# Patient Record
Sex: Female | Born: 1995 | Race: Black or African American | Hispanic: No | Marital: Single | Smoking: Never smoker
Health system: Southern US, Community
[De-identification: ages and names within clinical notes are randomized; demographics above are authoritative.]

## PROBLEM LIST (undated history)

## (undated) ENCOUNTER — Inpatient Hospital Stay (HOSPITAL_COMMUNITY): Payer: Self-pay

## (undated) ENCOUNTER — Emergency Department (HOSPITAL_COMMUNITY): Payer: BC Managed Care – PPO

## (undated) DIAGNOSIS — D270 Benign neoplasm of right ovary: Secondary | ICD-10-CM

## (undated) DIAGNOSIS — J45909 Unspecified asthma, uncomplicated: Secondary | ICD-10-CM

## (undated) DIAGNOSIS — D369 Benign neoplasm, unspecified site: Secondary | ICD-10-CM

## (undated) DIAGNOSIS — R519 Headache, unspecified: Secondary | ICD-10-CM

## (undated) DIAGNOSIS — K219 Gastro-esophageal reflux disease without esophagitis: Secondary | ICD-10-CM

## (undated) DIAGNOSIS — O09299 Supervision of pregnancy with other poor reproductive or obstetric history, unspecified trimester: Secondary | ICD-10-CM

## (undated) DIAGNOSIS — B977 Papillomavirus as the cause of diseases classified elsewhere: Secondary | ICD-10-CM

## (undated) HISTORY — DX: Benign neoplasm of right ovary: D27.0

## (undated) HISTORY — DX: Supervision of pregnancy with other poor reproductive or obstetric history, unspecified trimester: O09.299

## (undated) HISTORY — DX: Unspecified asthma, uncomplicated: J45.909

## (undated) HISTORY — PX: APPENDECTOMY: SHX54

---

## 2015-05-22 ENCOUNTER — Inpatient Hospital Stay (HOSPITAL_COMMUNITY)
Admission: AD | Admit: 2015-05-22 | Discharge: 2015-05-22 | Disposition: A | Discharge status: Home / Self Care | Payer: Self-pay | Source: Ambulatory Visit | Attending: Obstetrics and Gynecology | Admitting: Obstetrics and Gynecology

## 2015-05-22 ENCOUNTER — Inpatient Hospital Stay (HOSPITAL_COMMUNITY): Payer: Self-pay

## 2015-05-22 ENCOUNTER — Encounter (HOSPITAL_COMMUNITY): Payer: Self-pay | Admitting: *Deleted

## 2015-05-22 DIAGNOSIS — O26899 Other specified pregnancy related conditions, unspecified trimester: Secondary | ICD-10-CM

## 2015-05-22 DIAGNOSIS — R109 Unspecified abdominal pain: Secondary | ICD-10-CM | POA: Insufficient documentation

## 2015-05-22 DIAGNOSIS — O9989 Other specified diseases and conditions complicating pregnancy, childbirth and the puerperium: Secondary | ICD-10-CM | POA: Insufficient documentation

## 2015-05-22 DIAGNOSIS — Z3A01 Less than 8 weeks gestation of pregnancy: Secondary | ICD-10-CM | POA: Insufficient documentation

## 2015-05-22 LAB — POCT PREGNANCY, URINE: PREG TEST UR: POSITIVE — AB

## 2015-05-22 LAB — HCG, QUANTITATIVE, PREGNANCY: hCG, Beta Chain, Quant, S: 2904 m[IU]/mL — ABNORMAL HIGH (ref <5)

## 2015-05-22 LAB — CBC
HCT: 34.5 % — ABNORMAL LOW (ref 36.0–46.0)
Hemoglobin: 11.5 g/dL — ABNORMAL LOW (ref 12.0–15.0)
MCH: 26.8 pg (ref 26.0–34.0)
MCHC: 33.3 g/dL (ref 30.0–36.0)
MCV: 80.4 fL (ref 78.0–100.0)
PLATELETS: 344 10*3/uL (ref 150–400)
RBC: 4.29 MIL/uL (ref 3.87–5.11)
RDW: 15.7 % — AB (ref 11.5–15.5)
WBC: 11.3 10*3/uL — ABNORMAL HIGH (ref 4.0–10.5)

## 2015-05-22 LAB — WET PREP, GENITAL
TRICH WET PREP: NONE SEEN
Yeast Wet Prep HPF POC: NONE SEEN

## 2015-05-22 LAB — URINALYSIS, ROUTINE W REFLEX MICROSCOPIC
BILIRUBIN URINE: NEGATIVE
Glucose, UA: NEGATIVE mg/dL
Hgb urine dipstick: NEGATIVE
KETONES UR: NEGATIVE mg/dL
Nitrite: NEGATIVE
Protein, ur: NEGATIVE mg/dL
Specific Gravity, Urine: 1.025 (ref 1.005–1.030)
UROBILINOGEN UA: 0.2 mg/dL (ref 0.0–1.0)
pH: 5.5 (ref 5.0–8.0)

## 2015-05-22 LAB — URINE MICROSCOPIC-ADD ON

## 2015-05-22 NOTE — MAU Note (Addendum)
Pain in lower abd for past 3-4 days.  Every time she eats something she has burning and she can't sleep at night. +HPT x3

## 2015-05-22 NOTE — MAU Provider Note (Signed)
History     CSN: [id number]  Arrival date and time: 05/22/15 1231   None     Chief Complaint  Patient presents with  . Abdominal Pain   HPIpt is 19 yo G2P1 at [redacted]w[redacted]d pregnant who presents with lower abdominal pain. Pt denies constipation, diarrhea, spotting, bleeding or UTI sx.  Pt has had intermittent lower midline abd pain. Pt denies nausea-pt has not taken anything for the pain.  RN note: Joie Bimler Spurlock-Frizzell, RN Registered Nurse Addendum  MAU Note 05/22/2015 12:44 PM    Expand All Collapse All   Pain in lower abd for past 3-4 days. Every time she eats something she has burning and she can't sleep at night. +HPT x3       No past medical history on file.  No past surgical history on file.  No family history on file.  History  Substance Use Topics  . Smoking status: Not on file  . Smokeless tobacco: Not on file  . Alcohol Use: Not on file    Allergies: Allergies not on file  No prescriptions prior to admission    Review of Systems  Constitutional: Negative for fever and chills.  Gastrointestinal: Positive for abdominal pain. Negative for nausea, vomiting, diarrhea and constipation.  Genitourinary: Negative for dysuria and urgency.  Neurological: Negative for headaches.   Physical Exam   Blood pressure 137/68, pulse 75, temperature 98.3 F (36.8 C), temperature source Oral, resp. rate 18, height 5' 3.5" (1.613 m), weight 194 lb (87.998 kg), last menstrual period 04/08/2015.  Physical Exam  Nursing note and vitals reviewed. Constitutional: She is oriented to person, place, and time. She appears well-developed and well-nourished. No distress.  HENT:  Head: Normocephalic.  Eyes: Pupils are equal, round, and reactive to light.  Neck: Normal range of motion. Neck supple.  Cardiovascular: Normal rate.   Respiratory: Effort normal.  GI: Soft. She exhibits no distension. There is no tenderness. There is no rebound and no guarding.  Genitourinary: Vagina  normal.  Vagina clean, cervix clean, closed NT; uterus and adnexa without palpable enlargement or tenderness- exam complicated by habitus  Musculoskeletal: Normal range of motion.  Neurological: She is alert and oriented to person, place, and time.  Skin: Skin is warm and dry.  Psychiatric: She has a normal mood and affect.    MAU Course  Procedures Results for orders placed or performed during the hospital encounter of 05/22/15 (from the past 24 hour(s))  Urinalysis, Routine w reflex microscopic (not at [facility])     Status: Abnormal   Collection Time: 05/22/15 12:45 PM  Result Value Ref Range   Color, Urine YELLOW YELLOW   APPearance CLOUDY (A) CLEAR   Specific Gravity, Urine 1.025 1.005 - 1.030   pH 5.5 5.0 - 8.0   Glucose, UA NEGATIVE NEGATIVE mg/dL   Hgb urine dipstick NEGATIVE NEGATIVE   Bilirubin Urine NEGATIVE NEGATIVE   Ketones, ur NEGATIVE NEGATIVE mg/dL   Protein, ur NEGATIVE NEGATIVE mg/dL   Urobilinogen, UA 0.2 0.0 - 1.0 mg/dL   Nitrite NEGATIVE NEGATIVE   Leukocytes, UA MODERATE (A) NEGATIVE  Urine microscopic-add on     Status: Abnormal   Collection Time: 05/22/15 12:45 PM  Result Value Ref Range   Squamous Epithelial / LPF MANY (A) RARE   WBC, UA 7-10 <3 WBC/hpf   Bacteria, UA FEW (A) RARE  Pregnancy, urine POC     Status: Abnormal   Collection Time: 05/22/15 12:56 PM  Result Value Ref Range  Preg Test, Ur POSITIVE (A) NEGATIVE  Wet prep, genital     Status: Abnormal   Collection Time: 05/22/15  3:35 PM  Result Value Ref Range   Yeast Wet Prep HPF POC NONE SEEN NONE SEEN   Trich, Wet Prep NONE SEEN NONE SEEN   Clue Cells Wet Prep HPF POC FEW (A) NONE SEEN   WBC, Wet Prep HPF POC FEW (A) NONE SEEN  CBC     Status: Abnormal   Collection Time: 05/22/15  3:39 PM  Result Value Ref Range   WBC 11.3 (H) 4.0 - 10.5 K/uL   RBC 4.29 3.87 - 5.11 MIL/uL   Hemoglobin 11.5 (L) 12.0 - 15.0 g/dL   HCT 34.5 (L) 36.0 - 46.0 %   MCV 80.4 78.0 - 100.0 fL   MCH 26.8 26.0 -  34.0 pg   MCHC 33.3 30.0 - 36.0 g/dL   RDW 15.7 (H) 11.5 - 15.5 %   Platelets 344 150 - 400 K/uL  hCG, quantitative, pregnancy     Status: Abnormal   Collection Time: 05/22/15  3:39 PM  Result Value Ref Range   hCG, Beta Chain, Quant, S 2904 (H) <5 mIU/mL  ABO/Rh     Status: None (Preliminary result)   Collection Time: 05/22/15  3:39 PM  Result Value Ref Range   ABO/RH(D) O POS    Results for orders placed or performed during the hospital encounter of 05/22/15 (from the past 24 hour(s))  Urinalysis, Routine w reflex microscopic (not at [facility])     Status: Abnormal   Collection Time: 05/22/15 12:45 PM  Result Value Ref Range   Color, Urine YELLOW YELLOW   APPearance CLOUDY (A) CLEAR   Specific Gravity, Urine 1.025 1.005 - 1.030   pH 5.5 5.0 - 8.0   Glucose, UA NEGATIVE NEGATIVE mg/dL   Hgb urine dipstick NEGATIVE NEGATIVE   Bilirubin Urine NEGATIVE NEGATIVE   Ketones, ur NEGATIVE NEGATIVE mg/dL   Protein, ur NEGATIVE NEGATIVE mg/dL   Urobilinogen, UA 0.2 0.0 - 1.0 mg/dL   Nitrite NEGATIVE NEGATIVE   Leukocytes, UA MODERATE (A) NEGATIVE  Urine microscopic-add on     Status: Abnormal   Collection Time: 05/22/15 12:45 PM  Result Value Ref Range   Squamous Epithelial / LPF MANY (A) RARE   WBC, UA 7-10 <3 WBC/hpf   Bacteria, UA FEW (A) RARE  Pregnancy, urine POC     Status: Abnormal   Collection Time: 05/22/15 12:56 PM  Result Value Ref Range   Preg Test, Ur POSITIVE (A) NEGATIVE  Wet prep, genital     Status: Abnormal   Collection Time: 05/22/15  3:35 PM  Result Value Ref Range   Yeast Wet Prep HPF POC NONE SEEN NONE SEEN   Trich, Wet Prep NONE SEEN NONE SEEN   Clue Cells Wet Prep HPF POC FEW (A) NONE SEEN   WBC, Wet Prep HPF POC FEW (A) NONE SEEN  CBC     Status: Abnormal   Collection Time: 05/22/15  3:39 PM  Result Value Ref Range   WBC 11.3 (H) 4.0 - 10.5 K/uL   RBC 4.29 3.87 - 5.11 MIL/uL   Hemoglobin 11.5 (L) 12.0 - 15.0 g/dL   HCT 34.5 (L) 36.0 - 46.0 %   MCV 80.4  78.0 - 100.0 fL   MCH 26.8 26.0 - 34.0 pg   MCHC 33.3 30.0 - 36.0 g/dL   RDW 15.7 (H) 11.5 - 15.5 %   Platelets 344 150 - 400  K/uL  hCG, quantitative, pregnancy     Status: Abnormal   Collection Time: 05/22/15  3:39 PM  Result Value Ref Range   hCG, Beta Chain, Quant, S 2904 (H) <5 mIU/mL  ABO/Rh     Status: None (Preliminary result)   Collection Time: 05/22/15  3:39 PM  Result Value Ref Range   ABO/RH(D) O POS    US Ob Comp Less 14 Wks  05/22/2015   CLINICAL DATA:  Low abdominal pain with positive pregnancy test. Initial encounter.  EXAM: OBSTETRIC <14 WK [place] AND TRANSVAGINAL OB US  TECHNIQUE: Both transabdominal and transvaginal ultrasound examinations were performed for complete evaluation of the gestation as well as the maternal uterus, adnexal regions, and pelvic cul-de-sac. Transvaginal technique was performed to assess early pregnancy.  COMPARISON:  None.  FINDINGS: Intrauterine gestational sac: Visualized.  Yolk sac:  Visualized  Embryo:  Not visualized  MSD: 4.6  mm   5 w   0  d  [place] EDC: 01/22/2016  Maternal uterus/adnexae: Small subchorionic hemorrhage is associated. Maternal left ovary is sonographically normal. Corpus luteum cyst identified in the right ovary with an indeterminate 16 mm hyperechoic focus within the parenchyma. No intraperitoneal free fluid.  IMPRESSION: Intrauterine gestational sac visualized with yolk sac barely perceptible but no embryo yet evident. Given the very early stage of gestation by ultrasound, consider follow-up ultrasound exam in 7-10 days to ensure appropriate pregnancy progression. Right ovary could be reassessed at that time as well.   Electronically Signed   By: Misty Stanley M.D.   On: 05/22/2015 16:54   US Ob Transvaginal  05/22/2015   CLINICAL DATA:  Low abdominal pain with positive pregnancy test. Initial encounter.  EXAM: OBSTETRIC <14 WK [place] AND TRANSVAGINAL OB US  TECHNIQUE: Both transabdominal and transvaginal ultrasound examinations were  performed for complete evaluation of the gestation as well as the maternal uterus, adnexal regions, and pelvic cul-de-sac. Transvaginal technique was performed to assess early pregnancy.  COMPARISON:  None.  FINDINGS: Intrauterine gestational sac: Visualized.  Yolk sac:  Visualized  Embryo:  Not visualized  MSD: 4.6  mm   5 w   0  d  [place] EDC: 01/22/2016  Maternal uterus/adnexae: Small subchorionic hemorrhage is associated. Maternal left ovary is sonographically normal. Corpus luteum cyst identified in the right ovary with an indeterminate 16 mm hyperechoic focus within the parenchyma. No intraperitoneal free fluid.  IMPRESSION: Intrauterine gestational sac visualized with yolk sac barely perceptible but no embryo yet evident. Given the very early stage of gestation by ultrasound, consider follow-up ultrasound exam in 7-10 days to ensure appropriate pregnancy progression. Right ovary could be reassessed at that time as well.   Electronically Signed   By: Misty Stanley M.D.   On: 05/22/2015 16:54  will repeat HCG in 48 hours since YS barely pereptible Assessment and Plan  abd pain in pregnancy- repeat HG in 48 hours- order submitted Pt to return sooner if increase in pain or bleeding  Edrie Ehrich 05/22/2015, 2:56 PM

## 2015-05-22 NOTE — Discharge Instructions (Signed)

## 2015-05-23 LAB — ABO/RH: ABO/RH(D): O POS

## 2015-05-23 LAB — RPR: RPR Ser Ql: NONREACTIVE

## 2015-05-23 LAB — GC/CHLAMYDIA PROBE AMP (~~LOC~~) NOT AT ARMC
Chlamydia: NEGATIVE
NEISSERIA GONORRHEA: NEGATIVE

## 2015-05-23 LAB — HIV ANTIBODY (ROUTINE TESTING W REFLEX): HIV Screen 4th Generation wRfx: NONREACTIVE

## 2015-05-24 ENCOUNTER — Inpatient Hospital Stay (HOSPITAL_COMMUNITY)
Admission: AD | Admit: 2015-05-24 | Discharge: 2015-05-24 | Disposition: A | Discharge status: Home / Self Care | Payer: Self-pay | Source: Ambulatory Visit | Attending: Obstetrics & Gynecology | Admitting: Obstetrics & Gynecology

## 2015-05-24 DIAGNOSIS — Z3A01 Less than 8 weeks gestation of pregnancy: Secondary | ICD-10-CM | POA: Insufficient documentation

## 2015-05-24 DIAGNOSIS — R109 Unspecified abdominal pain: Secondary | ICD-10-CM

## 2015-05-24 DIAGNOSIS — O26899 Other specified pregnancy related conditions, unspecified trimester: Secondary | ICD-10-CM

## 2015-05-24 DIAGNOSIS — O9989 Other specified diseases and conditions complicating pregnancy, childbirth and the puerperium: Secondary | ICD-10-CM | POA: Insufficient documentation

## 2015-05-24 LAB — HCG, QUANTITATIVE, PREGNANCY: hCG, Beta Chain, Quant, S: 4218 m[IU]/mL — ABNORMAL HIGH (ref <5)

## 2015-05-24 NOTE — Discharge Instructions (Signed)
First Trimester of Pregnancy The first trimester of pregnancy is from week 1 until the end of week 12 (months 1 through 3). During this time, your baby will begin to develop inside you. At 6-8 weeks, the eyes and face are formed, and the heartbeat can be seen on ultrasound. At the end of 12 weeks, all the baby's organs are formed. Prenatal care is all the medical care you receive before the birth of your baby. Make sure you get good prenatal care and follow all of your doctor's instructions. HOME CARE  Medicines  Take medicine only as told by your doctor. Some medicines are safe and some are not during pregnancy.  Take your prenatal vitamins as told by your doctor.  Take medicine that helps you poop (stool softener) as needed if your doctor says it is okay. Diet  Eat regular, healthy meals.  Your doctor will tell you the amount of weight gain that is right for you.  Avoid raw meat and uncooked cheese.  If you feel sick to your stomach (nauseous) or throw up (vomit):  Eat 4 or 5 small meals a day instead of 3 large meals.  Try eating a few soda crackers.  Drink liquids between meals instead of during meals.  If you have a hard time pooping (constipation):  Eat high-fiber foods like fresh vegetables, fruit, and whole grains.  Drink enough fluids to keep your pee (urine) clear or pale yellow. Activity and Exercise  Exercise only as told by your doctor. Stop exercising if you have cramps or pain in your lower belly (abdomen) or low back.  Try to avoid standing for long periods of time. Move your legs often if you must stand in one place for a long time.  Avoid heavy lifting.  Wear low-heeled shoes. Sit and stand up straight.  You can have sex unless your doctor tells you not to. Relief of Pain or Discomfort  Wear a good support bra if your breasts are sore.  Take warm water baths (sitz baths) to soothe pain or discomfort caused by hemorrhoids. Use hemorrhoid cream if your  doctor says it is okay.  Rest with your legs raised if you have leg cramps or low back pain.  Wear support hose if you have puffy, bulging veins (varicose veins) in your legs. Raise (elevate) your feet for 15 minutes, 3-4 times a day. Limit salt in your diet. Prenatal Care  Schedule your prenatal visits by the twelfth week of pregnancy.  Write down your questions. Take them to your prenatal visits.  Keep all your prenatal visits as told by your doctor. Safety  Wear your seat belt at all times when driving.  Make a list of emergency phone numbers. The list should include numbers for family, friends, the hospital, and police and fire departments. General Tips  Ask your doctor for a referral to a local prenatal class. Begin classes no later than at the start of month 6 of your pregnancy.  Ask for help if you need counseling or help with nutrition. Your doctor can give you advice or tell you where to go for help.  Do not use hot tubs, steam rooms, or saunas.  Do not douche or use tampons or scented sanitary pads.  Do not cross your legs for long periods of time.  Avoid litter boxes and soil used by cats.  Avoid all smoking, herbs, and alcohol. Avoid drugs not approved by your doctor.  Visit your dentist. At home, brush your teeth   with a soft toothbrush. Be gentle when you floss. GET HELP IF:  You are dizzy.  You have mild cramps or pressure in your lower belly.  You have a nagging pain in your belly area.  You continue to feel sick to your stomach, throw up, or have watery poop (diarrhea).  You have a bad smelling fluid coming from your vagina.  You have pain with peeing (urination).  You have increased puffiness (swelling) in your face, hands, legs, or ankles. GET HELP RIGHT AWAY IF:   You have a fever.  You are leaking fluid from your vagina.  You have spotting or bleeding from your vagina.  You have very bad belly cramping or pain.  You gain or lose weight  rapidly.  You throw up blood. It may look like coffee grounds.  You are around people who have German measles, fifth disease, or chickenpox.  You have a very bad headache.  You have shortness of breath.  You have any kind of trauma, such as from a fall or a car accident. Document Released: 05/11/2008 Document Revised: 04/09/2014 Document Reviewed: 10/03/2013 ExitCare Patient Information 2015 [employer]. This information is not intended to replace advice given to you by your health care provider. Make sure you discuss any questions you have with your health care provider.  

## 2015-05-24 NOTE — MAU Note (Signed)
Here for repeat blood work.  Feeling fine.

## 2015-05-24 NOTE — MAU Provider Note (Signed)
Jillian Bishop  is a 19 y.o. G2P1001 at [redacted]w[redacted]d who presents to MAU today for follow-up quant hCG after 48 hours. After reviewing patient's [place] and lab results from previous visit repeat hCG testing was not required. IUGS and YS noted on [place] on 05/22/15. Patient denies vaginal bleeding, abdominal pain or fever today.   BP 132/65 mmHg  Pulse 74  Temp(Src) 98.3 F (36.8 C) (Oral)  Resp 18  Ht 5\' 4"  (1.626 m)  Wt 194 lb (87.998 kg)  BMI 33.28 kg/m2  LMP 04/08/2015  CONSTITUTIONAL: Well-developed, well-nourished female in no acute distress.  ENT: External right and left ear normal.  EYES: EOM intact, conjunctivae normal.  MUSCULOSKELETAL: Normal range of motion.  CARDIOVASCULAR: Regular heart rate RESPIRATORY: Normal effort NEUROLOGICAL: Alert and oriented to person, place, and time.  SKIN: Skin is warm and dry. No rash noted. Not diaphoretic. No erythema. No pallor. PSYCH: Normal mood and affect. Normal behavior. Normal judgment and thought content.  Results for orders placed or performed during the hospital encounter of 05/24/15 (from the past 24 hour(s))  hCG, quantitative, pregnancy     Status: Abnormal   Collection Time: 05/24/15  9:33 AM  Result Value Ref Range   hCG, Beta Chain, Quant, S 4218 (H) <5 mIU/mL    MDM [place] dating is not consistent with LMP dating and repeat hCG did not rise appropriately, however we have already ruled-out ectopic pregnancy Will repeat US in 1 week to determine viability  A: IUGS and YS at [redacted]w[redacted]d  P: Discharge home First trimester/ectopic precautions discussed Patient will return for follow-up US in 1 week. Order placed. They will call the patient with an appointment time Patient may return to MAU as needed or if her condition were to change or worsen   Luvenia Redden, PA-C 05/24/2015 11:26 AM

## 2015-05-30 LAB — OB RESULTS CONSOLE HGB/HCT, BLOOD
HEMATOCRIT: 37 %
Hemoglobin: 11.6 g/dL

## 2015-05-30 LAB — OB RESULTS CONSOLE HIV ANTIBODY (ROUTINE TESTING): HIV: NONREACTIVE

## 2015-05-30 LAB — OB RESULTS CONSOLE RPR: RPR: NONREACTIVE

## 2015-05-30 LAB — OB RESULTS CONSOLE HEPATITIS B SURFACE ANTIGEN: HEP B S AG: NEGATIVE

## 2015-05-30 LAB — OB RESULTS CONSOLE GC/CHLAMYDIA
Chlamydia: NEGATIVE
Gonorrhea: NEGATIVE

## 2015-05-30 LAB — OB RESULTS CONSOLE RUBELLA ANTIBODY, IGM: Rubella: IMMUNE

## 2015-05-30 LAB — OB RESULTS CONSOLE PLATELET COUNT: Platelets: 331 10*3/uL

## 2015-06-03 ENCOUNTER — Ambulatory Visit (HOSPITAL_COMMUNITY)
Admission: RE | Admit: 2015-06-03 | Discharge: 2015-06-03 | Disposition: A | Discharge status: Home / Self Care | Payer: Self-pay | Source: Ambulatory Visit | Attending: Medical | Admitting: Medical

## 2015-06-03 ENCOUNTER — Inpatient Hospital Stay (HOSPITAL_COMMUNITY)
Admission: AD | Admit: 2015-06-03 | Discharge: 2015-06-03 | Disposition: A | Discharge status: Home / Self Care | Payer: Self-pay | Source: Ambulatory Visit | Attending: Obstetrics & Gynecology | Admitting: Obstetrics & Gynecology

## 2015-06-03 DIAGNOSIS — O26899 Other specified pregnancy related conditions, unspecified trimester: Secondary | ICD-10-CM

## 2015-06-03 DIAGNOSIS — Z3491 Encounter for supervision of normal pregnancy, unspecified, first trimester: Secondary | ICD-10-CM

## 2015-06-03 DIAGNOSIS — Z3A01 Less than 8 weeks gestation of pregnancy: Secondary | ICD-10-CM | POA: Insufficient documentation

## 2015-06-03 DIAGNOSIS — O3481 Maternal care for other abnormalities of pelvic organs, first trimester: Secondary | ICD-10-CM | POA: Insufficient documentation

## 2015-06-03 DIAGNOSIS — N8320 Unspecified ovarian cysts: Secondary | ICD-10-CM

## 2015-06-03 DIAGNOSIS — N831 Corpus luteum cyst: Secondary | ICD-10-CM | POA: Insufficient documentation

## 2015-06-03 DIAGNOSIS — R109 Unspecified abdominal pain: Secondary | ICD-10-CM | POA: Insufficient documentation

## 2015-06-03 DIAGNOSIS — O208 Other hemorrhage in early pregnancy: Secondary | ICD-10-CM | POA: Insufficient documentation

## 2015-06-03 NOTE — MAU Provider Note (Signed)
S: continues to c/o some right lower quad pain but no vag bleeding. O: US shows viable IUP at 6.6 wks with sm cyst on right side A; viable IUP at 6.6 wks P: findings discussed with pt and pt given copy of [place] report.

## 2015-06-24 LAB — CYTOLOGY - PAP: CYTOLOGY - PAP: POSITIVE

## 2015-08-16 ENCOUNTER — Encounter (HOSPITAL_COMMUNITY): Payer: Self-pay

## 2015-08-16 ENCOUNTER — Inpatient Hospital Stay (HOSPITAL_COMMUNITY)
Admission: AD | Admit: 2015-08-16 | Discharge: 2015-08-16 | Disposition: A | Discharge status: Home / Self Care | Payer: Medicaid Other | Source: Ambulatory Visit | Attending: Obstetrics & Gynecology | Admitting: Obstetrics & Gynecology

## 2015-08-16 DIAGNOSIS — R102 Pelvic and perineal pain: Secondary | ICD-10-CM | POA: Diagnosis present

## 2015-08-16 DIAGNOSIS — N949 Unspecified condition associated with female genital organs and menstrual cycle: Secondary | ICD-10-CM

## 2015-08-16 DIAGNOSIS — Z3A18 18 weeks gestation of pregnancy: Secondary | ICD-10-CM | POA: Diagnosis not present

## 2015-08-16 DIAGNOSIS — O26893 Other specified pregnancy related conditions, third trimester: Secondary | ICD-10-CM | POA: Diagnosis not present

## 2015-08-16 LAB — URINE MICROSCOPIC-ADD ON

## 2015-08-16 LAB — URINALYSIS, ROUTINE W REFLEX MICROSCOPIC
Bilirubin Urine: NEGATIVE
Glucose, UA: NEGATIVE mg/dL
Hgb urine dipstick: NEGATIVE
Ketones, ur: NEGATIVE mg/dL
Nitrite: NEGATIVE
Protein, ur: NEGATIVE mg/dL
Specific Gravity, Urine: 1.02 (ref 1.005–1.030)
Urobilinogen, UA: 1 mg/dL (ref 0.0–1.0)
pH: 7 (ref 5.0–8.0)

## 2015-08-16 NOTE — MAU Note (Signed)
Feeling a lot of  rectal pressure and pelvic pressure. Feels pressure in lower abd. Denies dysuria. No LOF or bleeding

## 2015-08-16 NOTE — Discharge Instructions (Signed)
Pregnancy and Urinary Tract Infection °A urinary tract infection (UTI) is a bacterial infection of the urinary tract. Infection of the urinary tract can include the ureters, kidneys (pyelonephritis), bladder (cystitis), and urethra (urethritis). All pregnant women should be screened for bacteria in the urinary tract. Identifying and treating a UTI will decrease the risk of preterm labor and developing more serious infections in both the mother and baby. °CAUSES °Bacteria germs cause almost all UTIs.  °RISK FACTORS °Many factors can increase your chances of getting a UTI during pregnancy. These include: °· Having a short urethra. °· Poor toilet and hygiene habits. °· Sexual intercourse. °· Blockage of urine along the urinary tract. °· Problems with the pelvic muscles or nerves. °· Diabetes. °· Obesity. °· Bladder problems after having several children. °· Previous history of UTI. °SIGNS AND SYMPTOMS  °· Pain, burning, or a stinging feeling when urinating. °· Suddenly feeling the need to urinate right away (urgency). °· Loss of bladder control (urinary incontinence). °· Frequent urination, more than is common with pregnancy. °· Lower abdominal or back discomfort. °· Cloudy urine. °· Blood in the urine (hematuria). °· Fever.  °When the kidneys are infected, the symptoms may be: °· Back pain. °· Flank pain on the right side more so than the left. °· Fever. °· Chills. °· Nausea. °· Vomiting. °DIAGNOSIS  °A urinary tract infection is usually diagnosed through urine tests. Additional tests and procedures are sometimes done. These may include: °· Ultrasound exam of the kidneys, ureters, bladder, and urethra. °· Looking in the bladder with a lighted tube (cystoscopy). °TREATMENT °Typically, UTIs can be treated with antibiotic medicines.  °HOME CARE INSTRUCTIONS  °· Only take over-the-counter or prescription medicines as directed by your health care provider. If you were prescribed antibiotics, take them as directed. Finish  them even if you start to feel better. °· Drink enough fluids to keep your urine clear or pale yellow. °· Do not have sexual intercourse until the infection is gone and your health care provider says it is okay. °· Make sure you are tested for UTIs throughout your pregnancy. These infections often come back.  °Preventing a UTI in the Future °· Practice good toilet habits. Always wipe from front to back. Use the tissue only once. °· Do not hold your urine. Empty your bladder as soon as possible when the urge comes. °· Do not douche or use deodorant sprays. °· Wash with soap and warm water around the genital area and the anus. °· Empty your bladder before and after sexual intercourse. °· Wear underwear with a cotton crotch. °· Avoid caffeine and carbonated drinks. They can irritate the bladder. °· Drink cranberry juice or take cranberry pills. This may decrease the risk of getting a UTI. °· Do not drink alcohol. °· Keep all your appointments and tests as scheduled.  °SEEK MEDICAL CARE IF:  °· Your symptoms get worse. °· You are still having fevers 2 or more days after treatment begins. °· You have a rash. °· You feel that you are having problems with medicines prescribed. °· You have abnormal vaginal discharge. °SEEK IMMEDIATE MEDICAL CARE IF:  °· You have back or flank pain. °· You have chills. °· You have blood in your urine. °· You have nausea and vomiting. °· You have contractions of your uterus. °· You have a gush of fluid from the vagina. °MAKE SURE YOU: °· Understand these instructions.   °· Will watch your condition.   °· Will get help right away if you are not doing   well or get worse.   °Document Released: 03/20/2011 Document Revised: 09/13/2013 Document Reviewed: 06/22/2013 °ExitCare® Patient Information ©2015 [employer]. This information is not intended to replace advice given to you by your health care provider. Make sure you discuss any questions you have with your health care provider. ° °

## 2015-08-16 NOTE — MAU Provider Note (Signed)
History     CSN: [id number]  Arrival date and time: 08/16/15 2044   First Provider Initiated Contact with Patient 08/16/15 2141      Chief Complaint  Patient presents with  . Rectal Problems  . Abdominal Pain   HPI Jillian Bishop 19 y.o. [id number]@ [redacted]w[redacted]d presents to MAU with c/o vaginal pressure and rectal pressure. States she had a bowel movement earlier today. She denies vaginal bleeding, LOF, contractions, reports positive fetal movement.    Past Medical History  Diagnosis Date  . Medical history non-contributory     Past Surgical History  Procedure Laterality Date  . No past surgeries      Family History  Problem Relation Age of Onset  . Hypertension Mother   . Hypertension Father     Social History  Substance Use Topics  . Smoking status: Never Smoker   . Smokeless tobacco: Never Used  . Alcohol Use: No    Allergies: No Known Allergies  Prescriptions prior to admission  Medication Sig Dispense Refill Last Dose  . albuterol (PROVENTIL HFA;VENTOLIN HFA) 108 (90 BASE) MCG/ACT inhaler Inhale 2 puffs into the lungs every 6 (six) hours as needed for wheezing or shortness of breath.   Past Month at Unknown time  . Prenatal Vit-Fe Fumarate-FA (PRENATAL MULTIVITAMIN) TABS tablet Take 1 tablet by mouth daily at 12 noon.   08/16/2015 at Unknown time    Review of Systems  Constitutional: Negative for fever.  Genitourinary:       Vaginal and rectal pressure  All other systems reviewed and are negative.  Physical Exam   Blood pressure 132/61, pulse 88, temperature 98.4 F (36.9 C), resp. rate 18, height 5\' 4"  (1.626 m), weight 88.361 kg (194 lb 12.8 oz), last menstrual period 04/08/2015.  Physical Exam  Nursing note and vitals reviewed. Constitutional: She is oriented to person, place, and time. She appears well-developed and well-nourished. No distress.  HENT:  Head: Normocephalic.  Cardiovascular: Normal rate.   Respiratory: No respiratory distress.   Genitourinary: Vagina normal.  Musculoskeletal: Normal range of motion.  Neurological: She is alert and oriented to person, place, and time.  Skin: Skin is warm and dry.  Psychiatric: She has a normal mood and affect. Her behavior is normal. Judgment and thought content normal.   Dilation: Closed Effacement (%): Thick Exam by:: Yvonne Kendall CNM Results for orders placed or performed during the hospital encounter of 08/16/15 (from the past 24 hour(s))  Urinalysis, Routine w reflex microscopic (not at [facility])     Status: Abnormal   Collection Time: 08/16/15  9:15 PM  Result Value Ref Range   Color, Urine YELLOW YELLOW   APPearance CLEAR CLEAR   Specific Gravity, Urine 1.020 1.005 - 1.030   pH 7.0 5.0 - 8.0   Glucose, UA NEGATIVE NEGATIVE mg/dL   Hgb urine dipstick NEGATIVE NEGATIVE   Bilirubin Urine NEGATIVE NEGATIVE   Ketones, ur NEGATIVE NEGATIVE mg/dL   Protein, ur NEGATIVE NEGATIVE mg/dL   Urobilinogen, UA 1.0 0.0 - 1.0 mg/dL   Nitrite NEGATIVE NEGATIVE   Leukocytes, UA TRACE (A) NEGATIVE  Urine microscopic-add on     Status: Abnormal   Collection Time: 08/16/15  9:15 PM  Result Value Ref Range   Squamous Epithelial / LPF FEW (A) RARE   WBC, UA 3-6 <3 WBC/hpf   RBC / HPF 0-2 <3 RBC/hpf   Bacteria, UA FEW (A) RARE    MAU Course  Procedures  Will check for UTI. Cervix is  closed. Positive FHT's. Will send urine off for culture  Assessment and Plan  Vaginal pressure Discharge to home  [facility] 08/16/2015, 9:45 PM

## 2015-08-16 NOTE — Progress Notes (Signed)
Written and verbal d/c instructions given and understanding voiced. 

## 2015-08-28 ENCOUNTER — Encounter: Payer: Self-pay | Admitting: Advanced Practice Midwife

## 2015-08-28 ENCOUNTER — Ambulatory Visit (INDEPENDENT_AMBULATORY_CARE_PROVIDER_SITE_OTHER): Payer: Self-pay | Admitting: Advanced Practice Midwife

## 2015-08-28 VITALS — BP 135/61 | HR 86 | Temp 98.2°F | Wt 192.2 lb

## 2015-08-28 DIAGNOSIS — Z3493 Encounter for supervision of normal pregnancy, unspecified, third trimester: Secondary | ICD-10-CM | POA: Insufficient documentation

## 2015-08-28 DIAGNOSIS — Z3492 Encounter for supervision of normal pregnancy, unspecified, second trimester: Secondary | ICD-10-CM

## 2015-08-28 DIAGNOSIS — Z3482 Encounter for supervision of other normal pregnancy, second trimester: Secondary | ICD-10-CM

## 2015-08-28 DIAGNOSIS — Z23 Encounter for immunization: Secondary | ICD-10-CM

## 2015-08-28 LAB — POCT URINALYSIS DIP (DEVICE)
Bilirubin Urine: NEGATIVE
Glucose, UA: NEGATIVE mg/dL
HGB URINE DIPSTICK: NEGATIVE
Ketones, ur: NEGATIVE mg/dL
NITRITE: NEGATIVE
PH: 7 (ref 5.0–8.0)
Protein, ur: NEGATIVE mg/dL
Specific Gravity, Urine: 1.02 (ref 1.005–1.030)
UROBILINOGEN UA: 0.2 mg/dL (ref 0.0–1.0)

## 2015-08-28 MED ORDER — CONCEPT OB 130-92.4-1 MG PO CAPS: 1.0000 | ORAL_CAPSULE | Freq: Every day | ORAL | Status: DC

## 2015-08-28 NOTE — Progress Notes (Signed)
Initial prenatal education packet given Breastfeeding tip of the week reveiwed Flu vaccine today Labs today

## 2015-08-28 NOTE — Patient Instructions (Signed)
Second Trimester of Pregnancy The second trimester is from week 13 through week 28, months 4 through 6. The second trimester is often a time when you feel your best. Your body has also adjusted to being pregnant, and you begin to feel better physically. Usually, morning sickness has lessened or quit completely, you may have more energy, and you may have an increase in appetite. The second trimester is also a time when the fetus is growing rapidly. At the end of the sixth month, the fetus is about 9 inches long and weighs about 1 pounds. You will likely begin to feel the baby move (quickening) between 18 and 20 weeks of the pregnancy. BODY CHANGES Your body goes through many changes during pregnancy. The changes vary from woman to woman.   Your weight will continue to increase. You will notice your lower abdomen bulging out.  You may begin to get stretch marks on your hips, abdomen, and breasts.  You may develop headaches that can be relieved by medicines approved by your health care provider.  You may urinate more often because the fetus is pressing on your bladder.  You may develop or continue to have heartburn as a result of your pregnancy.  You may develop constipation because certain hormones are causing the muscles that push waste through your intestines to slow down.  You may develop hemorrhoids or swollen, bulging veins (varicose veins).  You may have back pain because of the weight gain and pregnancy hormones relaxing your joints between the bones in your pelvis and as a result of a shift in weight and the muscles that support your balance.  Your breasts will continue to grow and be tender.  Your gums may bleed and may be sensitive to brushing and flossing.  Dark spots or blotches (chloasma, mask of pregnancy) may develop on your face. This will likely fade after the baby is born.  A dark line from your belly button to the pubic area (linea nigra) may appear. This will likely  fade after the baby is born.  You may have changes in your hair. These can include thickening of your hair, rapid growth, and changes in texture. Some women also have hair loss during or after pregnancy, or hair that feels dry or thin. Your hair will most likely return to normal after your baby is born. WHAT TO EXPECT AT YOUR PRENATAL VISITS During a routine prenatal visit:  You will be weighed to make sure you and the fetus are growing normally.  Your blood pressure will be taken.  Your abdomen will be measured to track your baby's growth.  The fetal heartbeat will be listened to.  Any test results from the previous visit will be discussed. Your health care provider may ask you:  How you are feeling.  If you are feeling the baby move.  If you have had any abnormal symptoms, such as leaking fluid, bleeding, severe headaches, or abdominal cramping.  If you have any questions. Other tests that may be performed during your second trimester include:  Blood tests that check for:  Low iron levels (anemia).  Gestational diabetes (between 24 and 28 weeks).  Rh antibodies.  Urine tests to check for infections, diabetes, or protein in the urine.  An ultrasound to confirm the proper growth and development of the baby.  An amniocentesis to check for possible genetic problems.  Fetal screens for spina bifida and Down syndrome. HOME CARE INSTRUCTIONS   Avoid all smoking, herbs, alcohol, and unprescribed   drugs. These chemicals affect the formation and growth of the baby.  Follow your health care provider's instructions regarding medicine use. There are medicines that are either safe or unsafe to take during pregnancy.  Exercise only as directed by your health care provider. Experiencing uterine cramps is a good sign to stop exercising.  Continue to eat regular, healthy meals.  Wear a good support bra for breast tenderness.  Do not use hot tubs, steam rooms, or saunas.  Wear  your seat belt at all times when driving.  Avoid raw meat, uncooked cheese, cat litter boxes, and soil used by cats. These carry germs that can cause birth defects in the baby.  Take your prenatal vitamins.  Try taking a stool softener (if your health care provider approves) if you develop constipation. Eat more high-fiber foods, such as fresh vegetables or fruit and whole grains. Drink plenty of fluids to keep your urine clear or pale yellow.  Take warm sitz baths to soothe any pain or discomfort caused by hemorrhoids. Use hemorrhoid cream if your health care provider approves.  If you develop varicose veins, wear support hose. Elevate your feet for 15 minutes, 3-4 times a day. Limit salt in your diet.  Avoid heavy lifting, wear low heel shoes, and practice good posture.  Rest with your legs elevated if you have leg cramps or low back pain.  Visit your dentist if you have not gone yet during your pregnancy. Use a soft toothbrush to brush your teeth and be gentle when you floss.  A sexual relationship may be continued unless your health care provider directs you otherwise.  Continue to go to all your prenatal visits as directed by your health care provider. SEEK MEDICAL CARE IF:   You have dizziness.  You have mild pelvic cramps, pelvic pressure, or nagging pain in the abdominal area.  You have persistent nausea, vomiting, or diarrhea.  You have a bad smelling vaginal discharge.  You have pain with urination. SEEK IMMEDIATE MEDICAL CARE IF:   You have a fever.  You are leaking fluid from your vagina.  You have spotting or bleeding from your vagina.  You have severe abdominal cramping or pain.  You have rapid weight gain or loss.  You have shortness of breath with chest pain.  You notice sudden or extreme swelling of your face, hands, ankles, feet, or legs.  You have not felt your baby move in over an hour.  You have severe headaches that do not go away with  medicine.  You have vision changes. Document Released: 11/17/2001 Document Revised: 11/28/2013 Document Reviewed: 01/24/2013 [employer] Patient Information 2015 [employer]. This information is not intended to replace advice given to you by your health care provider. Make sure you discuss any questions you have with your health care provider.  Breastfeeding Deciding to breastfeed is one of the best choices you can make for you and your baby. A change in hormones during pregnancy causes your breast tissue to grow and increases the number and size of your milk ducts. These hormones also allow proteins, sugars, and fats from your blood supply to make breast milk in your milk-producing glands. Hormones prevent breast milk from being released before your baby is born as well as prompt milk flow after birth. Once breastfeeding has begun, thoughts of your baby, as well as his or her sucking or crying, can stimulate the release of milk from your milk-producing glands.  BENEFITS OF BREASTFEEDING For Your Baby  Your first   milk (colostrum) helps your baby's digestive system function better.   There are antibodies in your milk that help your baby fight off infections.   Your baby has a lower incidence of asthma, allergies, and sudden infant death syndrome.   The nutrients in breast milk are better for your baby than infant formulas and are designed uniquely for your baby's needs.   Breast milk improves your baby's brain development.   Your baby is less likely to develop other conditions, such as childhood obesity, asthma, or type 2 diabetes mellitus.  For You   Breastfeeding helps to create a very special bond between you and your baby.   Breastfeeding is convenient. Breast milk is always available at the correct temperature and costs nothing.   Breastfeeding helps to burn calories and helps you lose the weight gained during pregnancy.   Breastfeeding makes your uterus contract to its  prepregnancy size faster and slows bleeding (lochia) after you give birth.   Breastfeeding helps to lower your risk of developing type 2 diabetes mellitus, osteoporosis, and breast or ovarian cancer later in life. SIGNS THAT YOUR BABY IS HUNGRY Early Signs of Hunger  Increased alertness or activity.  Stretching.  Movement of the head from side to side.  Movement of the head and opening of the mouth when the corner of the mouth or cheek is stroked (rooting).  Increased sucking sounds, smacking lips, cooing, sighing, or squeaking.  Hand-to-mouth movements.  Increased sucking of fingers or hands. Late Signs of Hunger  Fussing.  Intermittent crying. Extreme Signs of Hunger Signs of extreme hunger will require calming and consoling before your baby will be able to breastfeed successfully. Do not wait for the following signs of extreme hunger to occur before you initiate breastfeeding:   Restlessness.  A loud, strong cry.   Screaming. BREASTFEEDING BASICS Breastfeeding Initiation  Find a comfortable place to sit or lie down, with your neck and back well supported.  Place a pillow or rolled up blanket under your baby to bring him or her to the level of your breast (if you are seated). Nursing pillows are specially designed to help support your arms and your baby while you breastfeed.  Make sure that your baby's abdomen is facing your abdomen.   Gently massage your breast. With your fingertips, massage from your chest wall toward your nipple in a circular motion. This encourages milk flow. You may need to continue this action during the feeding if your milk flows slowly.  Support your breast with 4 fingers underneath and your thumb above your nipple. Make sure your fingers are well away from your nipple and your baby's mouth.   Stroke your baby's lips gently with your finger or nipple.   When your baby's mouth is open wide enough, quickly bring your baby to your breast,  placing your entire nipple and as much of the colored area around your nipple (areola) as possible into your baby's mouth.   More areola should be visible above your baby's upper lip than below the lower lip.   Your baby's tongue should be between his or her lower gum and your breast.   Ensure that your baby's mouth is correctly positioned around your nipple (latched). Your baby's lips should create a seal on your breast and be turned out (everted).  It is common for your baby to suck about 2-3 minutes in order to start the flow of breast milk. Latching Teaching your baby how to latch on to your breast   properly is very important. An improper latch can cause nipple pain and decreased milk supply for you and poor weight gain in your baby. Also, if your baby is not latched onto your nipple properly, he or she may swallow some air during feeding. This can make your baby fussy. Burping your baby when you switch breasts during the feeding can help to get rid of the air. However, teaching your baby to latch on properly is still the best way to prevent fussiness from swallowing air while breastfeeding. Signs that your baby has successfully latched on to your nipple:    Silent tugging or silent sucking, without causing you pain.   Swallowing heard between every 3-4 sucks.    Muscle movement above and in front of his or her ears while sucking.  Signs that your baby has not successfully latched on to nipple:   Sucking sounds or smacking sounds from your baby while breastfeeding.  Nipple pain. If you think your baby has not latched on correctly, slip your finger into the corner of your baby's mouth to break the suction and place it between your baby's gums. Attempt breastfeeding initiation again. Signs of Successful Breastfeeding Signs from your baby:   A gradual decrease in the number of sucks or complete cessation of sucking.   Falling asleep.   Relaxation of his or her body.    Retention of a small amount of milk in his or her mouth.   Letting go of your breast by himself or herself. Signs from you:  Breasts that have increased in firmness, weight, and size 1-3 hours after feeding.   Breasts that are softer immediately after breastfeeding.  Increased milk volume, as well as a change in milk consistency and color by the fifth day of breastfeeding.   Nipples that are not sore, cracked, or bleeding. Signs That Your Baby is Getting Enough Milk  Wetting at least 3 diapers in a 24-hour period. The urine should be clear and pale yellow by age 5 days.  At least 3 stools in a 24-hour period by age 5 days. The stool should be soft and yellow.  At least 3 stools in a 24-hour period by age 7 days. The stool should be seedy and yellow.  No loss of weight greater than 10% of birth weight during the first 3 days of age.  Average weight gain of 4-7 ounces (113-198 g) per week after age 4 days.  Consistent daily weight gain by age 5 days, without weight loss after the age of 2 weeks. After a feeding, your baby may spit up a small amount. This is common. BREASTFEEDING FREQUENCY AND DURATION Frequent feeding will help you make more milk and can prevent sore nipples and breast engorgement. Breastfeed when you feel the need to reduce the fullness of your breasts or when your baby shows signs of hunger. This is called "breastfeeding on demand." Avoid introducing a pacifier to your baby while you are working to establish breastfeeding (the first 4-6 weeks after your baby is born). After this time you may choose to use a pacifier. Research has shown that pacifier use during the first year of a baby's life decreases the risk of sudden infant death syndrome (SIDS). Allow your baby to feed on each breast as long as he or she wants. Breastfeed until your baby is finished feeding. When your baby unlatches or falls asleep while feeding from the first breast, offer the second breast.  Because newborns are often sleepy in the   first few weeks of life, you may need to awaken your baby to get him or her to feed. Breastfeeding times will vary from baby to baby. However, the following rules can serve as a guide to help you ensure that your baby is properly fed:  Newborns (babies 4 weeks of age or younger) may breastfeed every 1-3 hours.  Newborns should not go longer than 3 hours during the day or 5 hours during the night without breastfeeding.  You should breastfeed your baby a minimum of 8 times in a 24-hour period until you begin to introduce solid foods to your baby at around 6 months of age. BREAST MILK PUMPING Pumping and storing breast milk allows you to ensure that your baby is exclusively fed your breast milk, even at times when you are unable to breastfeed. This is especially important if you are going back to work while you are still breastfeeding or when you are not able to be present during feedings. Your lactation consultant can give you guidelines on how long it is safe to store breast milk.  A breast pump is a machine that allows you to pump milk from your breast into a sterile bottle. The pumped breast milk can then be stored in a refrigerator or freezer. Some breast pumps are operated by hand, while others use electricity. Ask your lactation consultant which type will work best for you. Breast pumps can be purchased, but some hospitals and breastfeeding support groups lease breast pumps on a monthly basis. A lactation consultant can teach you how to hand express breast milk, if you prefer not to use a pump.  CARING FOR YOUR BREASTS WHILE YOU BREASTFEED Nipples can become dry, cracked, and sore while breastfeeding. The following recommendations can help keep your breasts moisturized and healthy:  Avoid using soap on your nipples.   Wear a supportive bra. Although not required, special nursing bras and tank tops are designed to allow access to your breasts for  breastfeeding without taking off your entire bra or top. Avoid wearing underwire-style bras or extremely tight bras.  Air dry your nipples for 3-4minutes after each feeding.   Use only cotton bra pads to absorb leaked breast milk. Leaking of breast milk between feedings is normal.   Use lanolin on your nipples after breastfeeding. Lanolin helps to maintain your skin's normal moisture barrier. If you use pure lanolin, you do not need to wash it off before feeding your baby again. Pure lanolin is not toxic to your baby. You may also hand express a few drops of breast milk and gently massage that milk into your nipples and allow the milk to air dry. In the first few weeks after giving birth, some women experience extremely full breasts (engorgement). Engorgement can make your breasts feel heavy, warm, and tender to the touch. Engorgement peaks within 3-5 days after you give birth. The following recommendations can help ease engorgement:  Completely empty your breasts while breastfeeding or pumping. You may want to start by applying warm, moist heat (in the shower or with warm water-soaked hand towels) just before feeding or pumping. This increases circulation and helps the milk flow. If your baby does not completely empty your breasts while breastfeeding, pump any extra milk after he or she is finished.  Wear a snug bra (nursing or regular) or tank top for 1-2 days to signal your body to slightly decrease milk production.  Apply ice packs to your breasts, unless this is too uncomfortable for you.    Make sure that your baby is latched on and positioned properly while breastfeeding. If engorgement persists after 48 hours of following these recommendations, contact your health care provider or a lactation consultant. OVERALL HEALTH CARE RECOMMENDATIONS WHILE BREASTFEEDING  Eat healthy foods. Alternate between meals and snacks, eating 3 of each per day. Because what you eat affects your breast milk,  some of the foods may make your baby more irritable than usual. Avoid eating these foods if you are sure that they are negatively affecting your baby.  Drink milk, fruit juice, and water to satisfy your thirst (about 10 glasses a day).   Rest often, relax, and continue to take your prenatal vitamins to prevent fatigue, stress, and anemia.  Continue breast self-awareness checks.  Avoid chewing and smoking tobacco.  Avoid alcohol and drug use. Some medicines that may be harmful to your baby can pass through breast milk. It is important to ask your health care provider before taking any medicine, including all over-the-counter and prescription medicine as well as vitamin and herbal supplements. It is possible to become pregnant while breastfeeding. If birth control is desired, ask your health care provider about options that will be safe for your baby. SEEK MEDICAL CARE IF:   You feel like you want to stop breastfeeding or have become frustrated with breastfeeding.  You have painful breasts or nipples.  Your nipples are cracked or bleeding.  Your breasts are red, tender, or warm.  You have a swollen area on either breast.  You have a fever or chills.  You have nausea or vomiting.  You have drainage other than breast milk from your nipples.  Your breasts do not become full before feedings by the fifth day after you give birth.  You feel sad and depressed.  Your baby is too sleepy to eat well.  Your baby is having trouble sleeping.   Your baby is wetting less than 3 diapers in a 24-hour period.  Your baby has less than 3 stools in a 24-hour period.  Your baby's skin or the white part of his or her eyes becomes yellow.   Your baby is not gaining weight by 5 days of age. SEEK IMMEDIATE MEDICAL CARE IF:   Your baby is overly tired (lethargic) and does not want to wake up and feed.  Your baby develops an unexplained fever. Document Released: 11/23/2005 Document Revised:  11/28/2013 Document Reviewed: 05/17/2013 Exit[employer] Patient Information 2015 [employer]. This information is not intended to replace advice given to you by your health care provider. Make sure you discuss any questions you have with your health care provider.  

## 2015-08-28 NOTE — Progress Notes (Signed)
   Subjective:    Jillian Bishop is a G2P1001 [redacted]w[redacted]d by 6 week [place] being seen today for her first obstetrical visit at [facility]. Transfer of care from [place]. States she had NOB labs and Pap there. Records have been requeste, but not received. 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 Does not think she was told she would need colpo. Her obstetrical history is significant for nothing. Patient does intend to breast feed. Pregnancy history fully reviewed.  Patient reports no complaints.  Filed Vitals:   08/28/15 0832  BP: 135/61  Pulse: 86  Temp: 98.2 F (36.8 C)  Weight: 192 lb 3.2 oz (87.181 kg)    HISTORY: OB History  Gravida Para Term Preterm AB SAB TAB Ectopic Multiple Living  2 1 1       1     # Outcome Date GA Lbr Len/2nd Weight Sex Delivery Anes PTL Lv  2 Current           1 Term 10/03/10 [redacted]w[redacted]d  5 lb 4 oz (2.381 kg) M Vag-Spont None N Y     Past Medical History  Diagnosis Date  . Asthma    Past Surgical History  Procedure Laterality Date  . Appendectomy     Family History  Problem Relation Age of Onset  . Hypertension Mother   . Hypertension Father   . Diabetes Father   . Hypertension Sister   . Heart disease Paternal Grandmother   . Hypertension Paternal Grandmother      Exam    Uterus:   21 cm  Pelvic Exam: Deferred   Neurologic: normal mood, grossly non-focal   Cardiovascular: Regular rate   Respiratory:  appears well, vitals normal, no respiratory distress, acyanotic, normal RR   Abdomen: soft, non-tender; bowel sounds normal; no masses,  no organomegaly      Assessment:    Pregnancy: G2P1001 Patient Active Problem List   Diagnosis Date Noted  . Supervision of normal pregnancy in second trimester 08/28/2015     1. Supervision of normal pregnancy in second trimester  - Prenat w/o A Vit-FeFum-FePo-FA (CONCEPT OB) 130-92.4-1 MG CAPS; Take 1 tablet by mouth daily.  Dispense: 30 capsule; Refill: 12 - [place] MFM OB COMP +  14 WK; Future - Glucose Tolerance, 1 HR (50g) w/o Fasting - Prenatal (OB Panel) - Hemoglobinopathy evaluation - Prescript Monitor Profile(19) - Urine Culture  2. Needs flu shot  - Flu Vaccine QUAD 36+ mos IM; Standing - Flu Vaccine QUAD 36+ mos IM  Plan:     Initial labs drawn. Prenatal vitamins. Problem list reviewed and updated. Genetic Screening discussed Quad Screen: declined.  Ultrasound discussed; fetal survey: ordered.  Follow up in 4 weeks. Discussed clinic routines, schedule of care and testing, genetic screening options, involvement of students and residents under the direct supervision of APPs and doctors and presence of female providers. Pt verbalized understanding. Will review Pap when records arrive and determine F/U.    Manya Silvas 08/28/2015

## 2015-08-29 LAB — PRESCRIPTION MONITORING PROFILE (19 PANEL)
Amphetamine/Meth: NEGATIVE ng/mL
BENZODIAZEPINE SCREEN, URINE: NEGATIVE ng/mL
BUPRENORPHINE, URINE: NEGATIVE ng/mL
Barbiturate Screen, Urine: NEGATIVE ng/mL
CANNABINOID SCRN UR: NEGATIVE ng/mL
COCAINE METABOLITES: NEGATIVE ng/mL
Carisoprodol, Urine: NEGATIVE ng/mL
Creatinine, Urine: 158.2 mg/dL (ref >20.0)
Fentanyl, Ur: NEGATIVE ng/mL
MDMA URINE: NEGATIVE ng/mL
METHADONE SCREEN, URINE: NEGATIVE ng/mL
METHAQUALONE SCREEN (URINE): NEGATIVE ng/mL
Meperidine, Ur: NEGATIVE ng/mL
Nitrites, Initial: NEGATIVE ug/mL
OPIATE SCREEN, URINE: NEGATIVE ng/mL
OXYCODONE SCRN UR: NEGATIVE ng/mL
PHENCYCLIDINE, UR: NEGATIVE ng/mL
PROPOXYPHENE: NEGATIVE ng/mL
TAPENTADOLUR: NEGATIVE ng/mL
Tramadol Scrn, Ur: NEGATIVE ng/mL
Zolpidem, Urine: NEGATIVE ng/mL
pH, Initial: 7.2 pH (ref 4.5–8.9)

## 2015-08-29 LAB — URINE CULTURE
COLONY COUNT: NO GROWTH
Organism ID, Bacteria: NO GROWTH

## 2015-08-29 LAB — GLUCOSE TOLERANCE, 1 HOUR (50G) W/O FASTING: Glucose, 1 Hour GTT: 143 mg/dL — ABNORMAL HIGH (ref 70–140)

## 2015-08-30 ENCOUNTER — Encounter: Payer: Self-pay | Admitting: *Deleted

## 2015-08-30 LAB — OBSTETRIC PANEL
Antibody Screen: NEGATIVE
BASOS ABS: 0 10*3/uL (ref 0.0–0.1)
Basophils Relative: 0 % (ref 0–1)
EOS PCT: 1 % (ref 0–5)
Eosinophils Absolute: 0.1 10*3/uL (ref 0.0–0.7)
HCT: 34.9 % — ABNORMAL LOW (ref 36.0–46.0)
Hemoglobin: 11.9 g/dL — ABNORMAL LOW (ref 12.0–15.0)
Hepatitis B Surface Ag: NEGATIVE
LYMPHS ABS: 1.2 10*3/uL (ref 0.7–4.0)
LYMPHS PCT: 11 % — AB (ref 12–46)
MCH: 27.1 pg (ref 26.0–34.0)
MCHC: 34.1 g/dL (ref 30.0–36.0)
MCV: 79.5 fL (ref 78.0–100.0)
MPV: 9.3 fL (ref 8.6–12.4)
Monocytes Absolute: 0.6 10*3/uL (ref 0.1–1.0)
Monocytes Relative: 5 % (ref 3–12)
NEUTROS PCT: 83 % — AB (ref 43–77)
Neutro Abs: 9.2 10*3/uL — ABNORMAL HIGH (ref 1.7–7.7)
Platelets: 320 10*3/uL (ref 150–400)
RBC: 4.39 MIL/uL (ref 3.87–5.11)
RDW: 14.5 % (ref 11.5–15.5)
Rh Type: POSITIVE
Rubella: 1.88 Index — ABNORMAL HIGH (ref <0.90)
WBC: 11.1 10*3/uL — ABNORMAL HIGH (ref 4.0–10.5)

## 2015-08-30 LAB — HEMOGLOBINOPATHY EVALUATION
HGB A2 QUANT: 2.7 % (ref 2.2–3.2)
HGB F QUANT: 0 % (ref 0.0–2.0)
HGB S QUANTITAION: 0 %
Hemoglobin Other: 0 %
Hgb A: 97.3 % (ref 96.8–97.8)

## 2015-09-03 ENCOUNTER — Telehealth: Payer: Self-pay | Admitting: General Practice

## 2015-09-03 NOTE — Telephone Encounter (Signed)
Patient needs 3 hr gtt. Called patient and informed her of results and discussed 3 hr gtt. Patient verbalized understanding and states she can come tomorrow after her ultrasound. Patient had no questions

## 2015-09-04 ENCOUNTER — Other Ambulatory Visit: Payer: Self-pay | Admitting: Advanced Practice Midwife

## 2015-09-04 ENCOUNTER — Ambulatory Visit (HOSPITAL_COMMUNITY)
Admission: RE | Admit: 2015-09-04 | Discharge: 2015-09-04 | Disposition: A | Discharge status: Home / Self Care | Payer: Medicaid Other | Attending: Advanced Practice Midwife | Admitting: Advanced Practice Midwife

## 2015-09-04 DIAGNOSIS — Z36 Encounter for antenatal screening of mother: Secondary | ICD-10-CM | POA: Diagnosis present

## 2015-09-04 DIAGNOSIS — Z3689 Encounter for other specified antenatal screening: Secondary | ICD-10-CM

## 2015-09-04 DIAGNOSIS — Z3A20 20 weeks gestation of pregnancy: Secondary | ICD-10-CM

## 2015-09-04 DIAGNOSIS — Z3492 Encounter for supervision of normal pregnancy, unspecified, second trimester: Secondary | ICD-10-CM

## 2015-09-05 ENCOUNTER — Telehealth: Payer: Self-pay | Admitting: *Deleted

## 2015-09-05 NOTE — Telephone Encounter (Signed)
No Message left on nurse line from this number.   Attempted to contact patient to inquire if she needed any assistance from the clinic since there was no audible message from this number.  Left message for patient to return call to clinic if needed any assistance.

## 2015-09-06 ENCOUNTER — Other Ambulatory Visit: Payer: Self-pay

## 2015-09-09 ENCOUNTER — Other Ambulatory Visit: Payer: Self-pay

## 2015-09-09 NOTE — Telephone Encounter (Signed)
Contacted patient, patient states that she did not mean to call the clinic.  Confirmed with patient her appointment for 3 hour gtt due to elevated glucose test.  Pt confirmed date/time.

## 2015-09-10 ENCOUNTER — Other Ambulatory Visit: Payer: Self-pay

## 2015-09-10 DIAGNOSIS — R7309 Other abnormal glucose: Secondary | ICD-10-CM

## 2015-09-11 LAB — GLUCOSE TOLERANCE, 3 HOURS
GLUCOSE 3 HOUR GTT: 38 mg/dL — AB (ref 70–144)
GLUCOSE, 1 HOUR-GESTATIONAL: 136 mg/dL (ref 70–189)
GLUCOSE, 2 HOUR-GESTATIONAL: 80 mg/dL (ref 70–164)
GLUCOSE, FASTING-GESTATIONAL: 89 mg/dL (ref 65–99)

## 2015-09-13 ENCOUNTER — Telehealth: Payer: Self-pay | Admitting: General Practice

## 2015-09-13 NOTE — Telephone Encounter (Signed)
Patient called and left message stating she needs her test results from her 3 hr gtt. Called patient and informed her of normal results. Patient verbalized understanding and had no questions

## 2015-09-16 ENCOUNTER — Telehealth: Payer: Self-pay | Admitting: *Deleted

## 2015-09-16 ENCOUNTER — Encounter: Payer: Self-pay | Admitting: Advanced Practice Midwife

## 2015-09-16 NOTE — Telephone Encounter (Signed)
Pt contacted clinic complaining of abdominal pressure, chest tightening, and inability to keep any food down.  Appointment  To see provider made for 09/16/15 @ 1530.  Attempted to contact patient, left message with appointment date/time to see provider.

## 2015-09-16 NOTE — Telephone Encounter (Signed)
Pt called the clinic and states she cannot come to an appointment today, can come Wednesday. Appointment changed.

## 2015-09-18 ENCOUNTER — Ambulatory Visit (INDEPENDENT_AMBULATORY_CARE_PROVIDER_SITE_OTHER): Payer: Self-pay | Admitting: Family

## 2015-09-18 VITALS — BP 135/64 | HR 98 | Temp 98.2°F | Wt 195.8 lb

## 2015-09-18 DIAGNOSIS — Z3492 Encounter for supervision of normal pregnancy, unspecified, second trimester: Secondary | ICD-10-CM

## 2015-09-18 DIAGNOSIS — Z3482 Encounter for supervision of other normal pregnancy, second trimester: Secondary | ICD-10-CM

## 2015-09-18 MED ORDER — METOCLOPRAMIDE HCL 10 MG PO TABS: 10.0000 mg | ORAL_TABLET | Freq: Three times a day (TID) | ORAL | Status: DC

## 2015-09-18 NOTE — Progress Notes (Signed)
Subjective:  Jillian Bishop is a 19 y.o. G2P1001 at [redacted]w[redacted]d being seen today for ongoing prenatal care.  Patient reports nasal congestion x 3 days.  Denies fever or chills.  +chest pain with vomiting.  Vomited x 3 in past 24 hours.  +diffiuculty catching breath prior to illness due to growing uterus.  .  Contractions: Not present.  Vag. Bleeding: None. Movement: (!) Decreased. +decreased movement since vomiting.  Denies leaking of fluid.   The following portions of the patient's history were reviewed and updated as appropriate: allergies, current medications, past family history, past medical history, past social history, past surgical history and problem list. Problem list updated.  Objective:   Filed Vitals:   09/18/15 1104  BP: 135/64  Pulse: 98  Temp: 98.2 F (36.8 C)  Weight: 195 lb 12.8 oz (88.814 kg)    Fetal Status: Fetal Heart Rate (bpm): 139 Fundal Height: 24 cm Movement: (!) Decreased     General:  Alert, oriented and cooperative. Patient is in no acute distress.  Skin: Skin is warm and dry. No rash noted.   Cardiovascular: Normal heart rate noted  Respiratory: Normal respiratory effort, no problems with respiration noted; lungs clear throughout auscultation; chest wall tenderness with palpation  Abdomen: Soft, gravid, appropriate for gestational age. Pain/Pressure: Present     Pelvic: Vag. Bleeding: None     Cervical exam deferred        Extremities: Normal range of motion.  Edema: None  Mental Status: Normal mood and affect. Normal behavior. Normal judgment and thought content.   Urinalysis:     Urine results not available at discharge.     Assessment and Plan:  Pregnancy: G2P1001 at [redacted]w[redacted]d  1. Supervision of normal pregnancy in second trimester - Reviewed 3 hr results  2. Upper Respiratory Infection - Given OTC list - Report if no improvement or worsening of symptoms  3.  Nausea and Vomiting - Increase fluids - RX Reglan   Preterm labor symptoms and general  obstetric precautions including but not limited to vaginal bleeding, contractions, leaking of fluid and fetal movement were reviewed in detail with the patient. Please refer to After Visit Summary for other counseling recommendations.   Keep scheduled appt for next week  Gwen Pounds, CNM

## 2015-09-18 NOTE — Progress Notes (Signed)
Pt c/o sore throat, productive cough, no fever, no chills, chest pain, SOB  O2 sats @ 100% RA  Pt reports not feeling baby move today; "since becoming sick a day ago I have not felt him move"

## 2015-09-18 NOTE — Patient Instructions (Signed)
AREA PEDIATRIC/FAMILY PRACTICE PHYSICIANS  [facility] [address] [place], [place] [address] Phone - [phone]   Fax - [place] [address] [place], [place]  [address] Phone - [phone]   Fax - [phone]  [place] [address], [place] [address] [place], [place]  [address] Phone - [phone]   Fax - [phone]  [facility] [address] [place], [place]  [address] Phone - [phone]   Fax - [phone]  [place] [address] [place], [place]  [address] Phone - [phone]   Fax - [place] [address] [place], [place]  [address] Phone - [phone]   Fax - [place] [facility] [place] [address] [place], [place]  [address] Phone - [phone]   Fax - [phone]  [place] [address] [place], [place], [place]  [address] Phone - [phone]   Fax - [place] [address] [place], [place]  [address] Phone - [phone]   Fax - [phone] [facility] [place]. [address] [place], [place]  [address] Phone - [phone]   Fax - [phone]  [place] [address] [place], [place]  [address] Phone - [phone]   Fax - [phone]  [facility] [place], [place], [place]  [address] Phone - [phone]   Fax - [place] [address] [place], [place]  [address] Phone - [phone]   Fax - [phone]  [facility] [address], [place], [place]  [address] Phone - [place], [place]  [address] Phone - [phone]   Fax - [place] [address], [place], [place]  [address] Phone - [phone]   Fax - [phone]  [place] [address] [place], [place]  [address] Phone - [phone]   Fax - [place]. [place], [place]  [address] Phone - [phone]   Fax - [place], [place], [place]  [address] Phone - [phone]   Fax - [place] [address], [place], [place]  [address] Phone - [phone]   Fax - [phone]  [place] [address] [place], [place]  [address] Phone - [phone]   Fax - [place] [address] [place], [place]  [address] Phone - [phone]   Fax - [phone]  [place] [address] [place], [place]  [address] Phone - [phone]   Fax - [phone] [place] [address] [place], [place]  [address] Phone - [phone]   Fax - [phone]  [place] [address] [place],   [address] Phone - [phone]   Fax - [place] [address], [place],   [address] Phone - [phone]   Fax - [phone]   Safe Medications in Pregnancy   Acne: Benzoyl Peroxide Salicylic Acid  Backache/Headache: Tylenol: 2 regular strength every 4 hours OR  2 Extra strength every 6 hours  Colds/Coughs/Allergies: Benadryl (alcohol free) 25 mg every 6 hours as needed Breath right strips Claritin Cepacol throat lozenges Chloraseptic throat spray Cold-Eeze- up to three times per day Cough drops, alcohol free Flonase (by prescription only) Guaifenesin Mucinex Robitussin DM (plain only, alcohol free) Saline nasal spray/drops Sudafed (pseudoephedrine) & Actifed ** use only after [redacted] weeks gestation and if you do not have  high blood pressure Tylenol Vicks Vaporub Zinc lozenges Zyrtec   Constipation: Colace Ducolax suppositories Fleet enema Glycerin suppositories Metamucil Milk of magnesia Miralax Senokot Smooth move tea  Diarrhea: Kaopectate Imodium A-D  *NO pepto Bismol  Hemorrhoids: Anusol Anusol HC Preparation H Tucks  Indigestion: Tums Maalox Mylanta Zantac  Pepcid  Insomnia: Benadryl (alcohol free) 25mg  every 6 hours as needed Tylenol PM Unisom, no Gelcaps  Leg Cramps: Tums MagGel  Nausea/Vomiting:  Bonine Dramamine Emetrol Ginger extract Sea bands Meclizine  Nausea medication to take during pregnancy:  Unisom (doxylamine succinate 25 mg tablets) Take one tablet daily at bedtime. If symptoms are not adequately controlled, the dose can be increased to a maximum recommended dose of two tablets daily (1/2 tablet in the morning, 1/2 tablet mid-afternoon and one at bedtime). Vitamin B6 100mg  tablets. Take one tablet twice a day (up to 200 mg per day).  Skin Rashes: Aveeno products Benadryl cream or 25mg  every 6 hours as needed Calamine Lotion 1% cortisone cream  Yeast infection: Gyne-lotrimin 7 Monistat 7   **If taking multiple medications, please check labels to avoid duplicating the same active ingredients **take medication as directed on the label ** Do not exceed 4000 mg of tylenol in 24 hours **Do not take medications that contain aspirin or ibuprofen

## 2015-09-19 ENCOUNTER — Encounter (HOSPITAL_COMMUNITY): Payer: Self-pay | Admitting: *Deleted

## 2015-09-19 ENCOUNTER — Inpatient Hospital Stay (HOSPITAL_COMMUNITY)
Admission: AD | Admit: 2015-09-19 | Discharge: 2015-09-19 | Disposition: A | Discharge status: Home / Self Care | Payer: Medicaid Other | Source: Ambulatory Visit | Attending: Obstetrics & Gynecology | Admitting: Obstetrics & Gynecology

## 2015-09-19 DIAGNOSIS — O368121 Decreased fetal movements, second trimester, fetus 1: Secondary | ICD-10-CM

## 2015-09-19 DIAGNOSIS — O368120 Decreased fetal movements, second trimester, not applicable or unspecified: Secondary | ICD-10-CM | POA: Diagnosis present

## 2015-09-19 DIAGNOSIS — Z3A22 22 weeks gestation of pregnancy: Secondary | ICD-10-CM | POA: Insufficient documentation

## 2015-09-19 NOTE — MAU Note (Signed)
No movement in 2 days.  +FH at dr's visit yesterday. Called about decreased movement, called about cold- was told she could take mucinex- heart has been racing since she took it

## 2015-09-19 NOTE — MAU Note (Signed)
Urine in lab 

## 2015-09-19 NOTE — MAU Note (Addendum)
Pt left without informing staff, unable to give DC instructions or get patient signature.

## 2015-09-19 NOTE — MAU Provider Note (Signed)
Chief Complaint:  Decreased Fetal Movement   None     HPI: Jillian Bishop is a 19 y.o. G2P1001 at 6w2dwho presents to maternity admissions reporting she has not felt fetal movement x 2-3 days.  She was seen in clinic yesterday and had normal FHR.  She called the clinic today when she still had not felt movement and was told to come to MAU. She denies abdominal pain, LOF, vaginal bleeding, vaginal itching/burning, urinary symptoms, h/a, dizziness, n/v, or fever/chills.    HPI  Past Medical History: Past Medical History  Diagnosis Date  . Asthma     Past obstetric history: OB History  Gravida Para Term Preterm AB SAB TAB Ectopic Multiple Living  2 1 1       1     # Outcome Date GA Lbr Len/2nd Weight Sex Delivery Anes PTL Lv  2 Current           1 Term 10/03/10 [redacted]w[redacted]d  2.381 kg (5 lb 4 oz) M Vag-Spont None N Y      Past Surgical History: Past Surgical History  Procedure Laterality Date  . Appendectomy      Family History: Family History  Problem Relation Age of Onset  . Hypertension Mother   . Hypertension Father   . Diabetes Father   . Hypertension Sister   . Heart disease Paternal Grandmother   . Hypertension Paternal Grandmother     Social History: Social History  Substance Use Topics  . Smoking status: Never Smoker   . Smokeless tobacco: Never Used  . Alcohol Use: No    Allergies: No Known Allergies  Meds:  Prescriptions prior to admission  Medication Sig Dispense Refill Last Dose  . albuterol (PROVENTIL HFA;VENTOLIN HFA) 108 (90 BASE) MCG/ACT inhaler Inhale 2 puffs into the lungs every 6 (six) hours as needed for wheezing or shortness of breath.   rescue  . dextromethorphan-guaiFENesin (MUCINEX DM) 30-600 MG 12hr tablet Take 1 tablet by mouth 2 (two) times daily as needed for cough (cold).   09/19/2015 at 1030  . metoCLOPramide (REGLAN) 10 MG tablet Take 1 tablet (10 mg total) by mouth 3 (three) times daily with meals. 90 tablet 1 not pu yet  . Prenat w/o  A Vit-FeFum-FePo-FA (CONCEPT OB) 130-92.4-1 MG CAPS Take 1 tablet by mouth daily. 30 capsule 12 not pu yet    ROS:  Review of Systems  Constitutional: Negative for fever, chills and fatigue.  HENT: Negative for sinus pressure.   Eyes: Negative for photophobia.  Respiratory: Negative for shortness of breath.   Cardiovascular: Negative for chest pain.  Gastrointestinal: Negative for nausea, vomiting, diarrhea and constipation.  Genitourinary: Negative for dysuria, frequency, flank pain, vaginal bleeding, vaginal discharge, difficulty urinating, vaginal pain and pelvic pain.  Musculoskeletal: Negative for neck pain.  Neurological: Negative for dizziness, weakness and headaches.  Psychiatric/Behavioral: Negative.     I have reviewed patient's Past Medical Hx, Surgical Hx, Family Hx, Social Hx, medications and allergies.   Physical Exam  Patient Vitals for the past 24 hrs:  BP Temp Temp src Pulse Resp SpO2  09/19/15 1757 132/60 mmHg 98.7 F (37.1 C) Oral 102 18 100 %   Constitutional: Well-developed, well-nourished female in no acute distress.  Cardiovascular: normal rate Respiratory: normal effort GI: Abd soft, non-tender, gravid appropriate for gestational age.  MS: Extremities nontender, no edema, normal ROM Neurologic: Alert and oriented x 4.      FHT:  150 by doppler with audible fetal movement  MAU Course/MDM: FHT normal.  Pt VS normal without elevated heart rate. Reassurance provided to pt about normal FHR. If pt does not like how Mucinex makes her feel, she can use nonpharmacologic ways to treat URI including saline nasal spray, increased PO fluids and rest.  Pt asked why she was not getting ultrasound.  She reports the clinic told her she would get an ultrasound when she came to MAU.  Informed pt that FHR is reassuring at 22 weeks and ultrasound does not provide additional information.  Offered to order outpatient ultrasound but pt reports she just wants to go home.  Pt  upset, stopped speaking with provider and answered the phone while provider in room and turned away.  Pt left room prior to being discharge by RN.  Pt stable at time of discharge.  Assessment: 1. Decreased fetal movement, second trimester, fetus 1     Plan: Discharge home  Follow-up Information    Follow up with [facility].   Specialty:  Obstetrics and Gynecology   Why:  As scheduled   Contact information:   [place] [phone]      Follow up with [place].   Why:  As needed for emergencies   Contact information:   [address] [id number] [place] [phone]       Medication List    TAKE these medications        albuterol 108 (90 BASE) MCG/ACT inhaler  Commonly known as:  PROVENTIL HFA;VENTOLIN HFA  Inhale 2 puffs into the lungs every 6 (six) hours as needed for wheezing or shortness of breath.     CONCEPT OB 130-92.4-1 MG Caps  Take 1 tablet by mouth daily.     dextromethorphan-guaiFENesin 30-600 MG 12hr tablet  Commonly known as:  MUCINEX DM  Take 1 tablet by mouth 2 (two) times daily as needed for cough (cold).     metoCLOPramide 10 MG tablet  Commonly known as:  REGLAN  Take 1 tablet (10 mg total) by mouth 3 (three) times daily with meals.        Fatima Blank Certified Nurse-Midwife 09/19/2015 6:42 PM

## 2015-09-19 NOTE — Progress Notes (Signed)
Faith from admissions, had come back- said pt was in lobby crying.  Called pt back into triage to talk to her.  Was explaining fetal movement, and [place]  Testing, reiterating discussion Fatima Blank had; explained unable to do BPP at 22wks, as all the components we look for are not seen yet, pt got upset- said we would be able to see movement on an [place], then stated  I'm not coming back here I'm just going to go to my doctor.  Explained that is not always an option, the clinics do close; the providers are the same in the clinic and here. At this point the pt said I'm leaving and walked out

## 2015-09-25 ENCOUNTER — Ambulatory Visit (INDEPENDENT_AMBULATORY_CARE_PROVIDER_SITE_OTHER): Payer: Self-pay | Admitting: Advanced Practice Midwife

## 2015-09-25 VITALS — BP 119/54 | HR 80 | Temp 98.5°F | Wt 195.2 lb

## 2015-09-25 DIAGNOSIS — O26891 Other specified pregnancy related conditions, first trimester: Secondary | ICD-10-CM

## 2015-09-25 DIAGNOSIS — Z3492 Encounter for supervision of normal pregnancy, unspecified, second trimester: Secondary | ICD-10-CM

## 2015-09-25 DIAGNOSIS — Z3482 Encounter for supervision of other normal pregnancy, second trimester: Secondary | ICD-10-CM

## 2015-09-25 DIAGNOSIS — N898 Other specified noninflammatory disorders of vagina: Secondary | ICD-10-CM

## 2015-09-25 DIAGNOSIS — Z113 Encounter for screening for infections with a predominantly sexual mode of transmission: Secondary | ICD-10-CM

## 2015-09-25 LAB — POCT URINALYSIS DIP (DEVICE)
BILIRUBIN URINE: NEGATIVE
GLUCOSE, UA: NEGATIVE mg/dL
Hgb urine dipstick: NEGATIVE
Ketones, ur: NEGATIVE mg/dL
NITRITE: NEGATIVE
Protein, ur: NEGATIVE mg/dL
Specific Gravity, Urine: 1.02 (ref 1.005–1.030)
UROBILINOGEN UA: 0.2 mg/dL (ref 0.0–1.0)
pH: 7.5 (ref 5.0–8.0)

## 2015-09-25 MED ORDER — PROMETHAZINE HCL 25 MG PO TABS: 12.5000 mg | ORAL_TABLET | Freq: Four times a day (QID) | ORAL | Status: DC | PRN

## 2015-09-25 NOTE — Patient Instructions (Signed)
Thinking About Doren Custard???  You must attend a Doren Custard class at [facility]  3rd Wednesday of every month from 7-9pm  [name] by calling [phone] or online at [email or web address]  Bring [place] the certificate from the class   Waterbirth supplies needed for [facility] patients:  Our practice has a Heritage manager in a Box tub at the hospital that you can borrow  You will need to purchase an accessory kit that has all needed supplies through [facility] [phone]) or online $175.00  Or you can purchase the supplies separately: o Single-use disposable tub liner for [employer] in a Box (REGULAR size) o New garden hose labeled "lead-free", "suitable for drinking water", o Electric drain pump to remove water (We recommend 792 gallon per hour or greater pump.)  o  "non-toxic" OR "water potable" o Garden hose to remove the dirty water o Fish net o Bathing suit top (optional) o Long-handled mirror (optional)  [employer] sells tubs for ~ $120 if you would rather purchase your own tub  [employer] (www.thelaborladies.com) $275 for tub rental/set-up & take down/kit    Things that would prevent you from having a waterbirth:  Premature, <37wks  Previous cesarean birth  Presence of thick meconium-stained fluid  Multiple gestation (Twins, triplets, etc.)  Uncontrolled diabetes  Hypertension  Heavy vaginal bleeding  Non-reassuring fetal heart rate  Active infection (MRSA, etc.)  If your labor has to be induced and induction method requires continuous monitoring of the baby's heart rate  Other risks/issues identified by your obstetrical provider

## 2015-09-25 NOTE — Progress Notes (Signed)
Pt reports having pressure in her rectum; moderate amt of discharge  Pt interested in water birth Pt also reports vomiting; Reglan not effective

## 2015-09-26 ENCOUNTER — Other Ambulatory Visit: Payer: Self-pay | Admitting: Advanced Practice Midwife

## 2015-09-26 ENCOUNTER — Encounter: Payer: Self-pay | Admitting: *Deleted

## 2015-09-26 LAB — WET PREP, GENITAL
TRICH WET PREP: NONE SEEN
YEAST WET PREP: NONE SEEN

## 2015-09-26 LAB — GC/CHLAMYDIA PROBE AMP (~~LOC~~) NOT AT ARMC
CHLAMYDIA, DNA PROBE: NEGATIVE
Neisseria Gonorrhea: NEGATIVE

## 2015-09-26 MED ORDER — METRONIDAZOLE 500 MG PO TABS: 500.0000 mg | ORAL_TABLET | Freq: Two times a day (BID) | ORAL | Status: DC

## 2015-09-26 NOTE — Progress Notes (Signed)
Subjective:  Jillian Bishop is a 19 y.o. G2P1001 at [redacted]w[redacted]d being seen today for ongoing prenatal care.  Patient reports vaginal discharge and rectal pressure.  Contractions: Not present.  Vag. Bleeding: None. Movement: Present. Denies leaking of fluid.   The following portions of the patient's history were reviewed and updated as appropriate: allergies, current medications, past family history, past medical history, past social history, past surgical history and problem list. Problem list updated.  Objective:   Filed Vitals:   09/25/15 0856  BP: 119/54  Pulse: 80  Temp: 98.5 F (36.9 C)  Weight: 88.542 kg (195 lb 3.2 oz)    Fetal Status: Fetal Heart Rate (bpm): 142   Movement: Present     General:  Alert, oriented and cooperative. Patient is in no acute distress.  Skin: Skin is warm and dry. No rash noted.   Cardiovascular: Normal heart rate noted  Respiratory: Normal respiratory effort, no problems with respiration noted  Abdomen: Soft, gravid, appropriate for gestational age. Pain/Pressure: Present     Pelvic: Vag. Bleeding: None Vag D/C Character: White   Cervical exam performed Dilation: Closed Effacement (%): Thick Station: Ballotable  Extremities: Normal range of motion.  Edema: None  Mental Status: Normal mood and affect. Normal behavior. Normal judgment and thought content.   Urinalysis: Urine Protein: Negative Urine Glucose: Negative  Assessment and Plan:  Pregnancy: G2P1001 at [redacted]w[redacted]d  1. Supervision of normal pregnancy in second trimester  - Wet prep, genital - GC/Chlamydia probe amp ([place])not at [facility]  2. Vaginal discharge during pregnancy in first trimester  - Wet prep, genital - GC/Chlamydia probe amp ([place])not at [facility]  Preterm labor symptoms and general obstetric precautions including but not limited to vaginal bleeding, contractions, leaking of fluid and fetal movement were reviewed in detail with the patient. Please refer to After Visit Summary  for other counseling recommendations.  Return in about 4 weeks (around 10/23/2015).   Elvera Maria, CNM

## 2015-10-11 ENCOUNTER — Telehealth: Payer: Self-pay

## 2015-10-11 NOTE — Telephone Encounter (Signed)
Pt called regarding  A mucous discharge. Pt described it as a snot like discharge. Upon questioning pt denies any odor, itching, or burning upon urination. Pt was prescribed Flagyl 500mg  BID  For BV and has taken full course of medication and is now experiencing discharge (which she was not experiencing before). Upon discussing pts concerns with fellow nurses I informed pt it could be due to the [place] or it could be a normal pregnancy discharge. I informed pt if it becomes a problem or starts to have a odor or cause pain or itching to call the clinic back for further evaluation of this problem. Asked if patient had any other questions pt denies further questions and states understanding.

## 2015-10-21 ENCOUNTER — Ambulatory Visit (INDEPENDENT_AMBULATORY_CARE_PROVIDER_SITE_OTHER): Payer: Medicaid Other | Admitting: Advanced Practice Midwife

## 2015-10-21 ENCOUNTER — Encounter: Payer: Self-pay | Admitting: Advanced Practice Midwife

## 2015-10-21 VITALS — BP 134/68 | HR 107 | Temp 98.4°F | Wt 196.9 lb

## 2015-10-21 DIAGNOSIS — Z3482 Encounter for supervision of other normal pregnancy, second trimester: Secondary | ICD-10-CM | POA: Diagnosis not present

## 2015-10-21 DIAGNOSIS — O4702 False labor before 37 completed weeks of gestation, second trimester: Secondary | ICD-10-CM

## 2015-10-21 DIAGNOSIS — Z23 Encounter for immunization: Secondary | ICD-10-CM

## 2015-10-21 DIAGNOSIS — Z3492 Encounter for supervision of normal pregnancy, unspecified, second trimester: Secondary | ICD-10-CM

## 2015-10-21 LAB — CBC
HEMATOCRIT: 34.5 % — AB (ref 36.0–46.0)
Hemoglobin: 11.3 g/dL — ABNORMAL LOW (ref 12.0–15.0)
MCH: 26.3 pg (ref 26.0–34.0)
MCHC: 32.8 g/dL (ref 30.0–36.0)
MCV: 80.2 fL (ref 78.0–100.0)
MPV: 9.6 fL (ref 8.6–12.4)
Platelets: 338 10*3/uL (ref 150–400)
RBC: 4.3 MIL/uL (ref 3.87–5.11)
RDW: 14.3 % (ref 11.5–15.5)
WBC: 13.1 10*3/uL — AB (ref 4.0–10.5)

## 2015-10-21 LAB — POCT URINALYSIS DIP (DEVICE)
BILIRUBIN URINE: NEGATIVE
Glucose, UA: NEGATIVE mg/dL
HGB URINE DIPSTICK: NEGATIVE
KETONES UR: 15 mg/dL — AB
Leukocytes, UA: NEGATIVE
Nitrite: NEGATIVE
PH: 6 (ref 5.0–8.0)
PROTEIN: NEGATIVE mg/dL
Urobilinogen, UA: 0.2 mg/dL (ref 0.0–1.0)

## 2015-10-21 LAB — FETAL FIBRONECTIN: Fetal Fibronectin: NEGATIVE

## 2015-10-21 MED ORDER — TETANUS-DIPHTH-ACELL PERTUSSIS 5-2.5-18.5 LF-MCG/0.5 IM SUSP
0.5000 mL | Freq: Once | INTRAMUSCULAR | Status: AC
  Administered 2015-10-21: 0.5 mL via INTRAMUSCULAR

## 2015-10-21 NOTE — Progress Notes (Signed)
Breastfeeding tip of the week reviewed 1hr gtt and 28 week labs today tdap today

## 2015-10-21 NOTE — Patient Instructions (Signed)

## 2015-10-21 NOTE — Progress Notes (Signed)
Subjective:  Jillian Bishop is a 19 y.o. G2P1001 at [redacted]w[redacted]d being seen today for ongoing prenatal care.  Patient reports occasional contractions and spotting at times, none in past several days.   States they are less than 5 per hour. Spotting on tissue only .    Contractions: Irritability.  Vag. Bleeding: None. Movement: Present. Denies leaking of fluid.   The following portions of the patient's history were reviewed and updated as appropriate: allergies, current medications, past family history, past medical history, past social history, past surgical history and problem list. Problem list updated.  Objective:   Filed Vitals:   10/21/15 1552  BP: 134/68  Pulse: 107  Temp: 98.4 F (36.9 C)  Weight: 196 lb 14.4 oz (89.313 kg)    Fetal Status: Fetal Heart Rate (bpm): 132   Movement: Present     General:  Alert, oriented and cooperative. Patient is in no acute distress.  Skin: Skin is warm and dry. No rash noted.   Cardiovascular: Normal heart rate noted  Respiratory: Normal respiratory effort, no problems with respiration noted  Abdomen: Soft, gravid, appropriate for gestational age. Pain/Pressure: Present     Pelvic: Vag. Bleeding: None     Cervical exam performed        Extremities: Normal range of motion.  Edema: None  Mental Status: Normal mood and affect. Normal behavior. Normal judgment and thought content.   Urinalysis:      Assessment and Plan:  Pregnancy: G2P1001 at [redacted]w[redacted]d  1. Supervision of normal pregnancy in second trimester       - Glucose Tolerance, 1 HR (50g) w/o Fasting - CBC - RPR - HIV antibody (with reflex) - Tdap (BOOSTRIX) injection 0.5 mL; Inject 0.5 mLs into the muscle once. - Fetal fibronectin  2. Preterm uterine contractions in second trimester, antepartum     Fetal fibronectin collected     Consulted Dr Roselie Awkward.      If FFn is positive, would give steroids. May need cervix recheck. Scheduled for one next week     Strict preterm labor precautions      Preterm labor symptoms and general obstetric precautions including but not limited to vaginal bleeding, contractions, leaking of fluid and fetal movement were reviewed in detail with the patient. Please refer to After Visit Summary for other counseling recommendations.  Return in about 1 week (around 10/28/2015) for [facility].   Seabron Spates, CNM

## 2015-10-22 LAB — RPR

## 2015-10-22 LAB — HIV ANTIBODY (ROUTINE TESTING W REFLEX): HIV: NONREACTIVE

## 2015-10-22 LAB — GLUCOSE TOLERANCE, 1 HOUR (50G) W/O FASTING: Glucose, 1 Hour GTT: 140 mg/dL (ref 70–140)

## 2015-10-23 ENCOUNTER — Encounter: Payer: Self-pay | Admitting: Family Medicine

## 2015-10-23 ENCOUNTER — Telehealth: Payer: Self-pay | Admitting: General Practice

## 2015-10-23 NOTE — Telephone Encounter (Signed)
Patient called and left message requesting test results from the preterm labor test. Per Jillian Bishop, patient needs 3 hr. Called patient and informed her of all results and recommendations. Patient verbalized understanding and will come in on 11/21 @ 8am for 3 hr gtt and ob f/u. Patient aware to come in fasting. Patient had no questions

## 2015-10-28 ENCOUNTER — Encounter: Payer: Medicaid Other | Admitting: Obstetrics and Gynecology

## 2015-11-04 ENCOUNTER — Encounter: Payer: Self-pay | Admitting: Obstetrics and Gynecology

## 2015-11-04 ENCOUNTER — Ambulatory Visit (INDEPENDENT_AMBULATORY_CARE_PROVIDER_SITE_OTHER): Payer: Medicaid Other | Admitting: Obstetrics and Gynecology

## 2015-11-04 VITALS — BP 136/68 | HR 102 | Wt 196.6 lb

## 2015-11-04 DIAGNOSIS — Z3492 Encounter for supervision of normal pregnancy, unspecified, second trimester: Secondary | ICD-10-CM

## 2015-11-04 DIAGNOSIS — Z3483 Encounter for supervision of other normal pregnancy, third trimester: Secondary | ICD-10-CM

## 2015-11-04 LAB — POCT URINALYSIS DIP (DEVICE)
Bilirubin Urine: NEGATIVE
GLUCOSE, UA: 500 mg/dL — AB
Hgb urine dipstick: NEGATIVE
Nitrite: NEGATIVE
PH: 7 (ref 5.0–8.0)
PROTEIN: NEGATIVE mg/dL
SPECIFIC GRAVITY, URINE: 1.025 (ref 1.005–1.030)
UROBILINOGEN UA: 1 mg/dL (ref 0.0–1.0)

## 2015-11-04 NOTE — Progress Notes (Signed)
Subjective:  Jillian Bishop is a 19 y.o. G2P1001 at [redacted]w[redacted]d being seen today for ongoing prenatal care.  She is currently monitored for the following issues for this high-risk pregnancy and has Supervision of normal pregnancy in second trimester on her problem list.  Patient reports no complaints.   .  .   . Denies leaking of fluid.   The following portions of the patient's history were reviewed and updated as appropriate: allergies, current medications, past family history, past medical history, past social history, past surgical history and problem list. Problem list updated.  Objective:  There were no vitals filed for this visit.  Fetal Status:           General:  Alert, oriented and cooperative. Patient is in no acute distress.  Skin: Skin is warm and dry. No rash noted.   Cardiovascular: Normal heart rate noted  Respiratory: Normal respiratory effort, no problems with respiration noted  Abdomen: Soft, gravid, appropriate for gestational age.       Pelvic:       Cervical exam performed      1/30/ballotable  Extremities: Normal range of motion.     Mental Status: Normal mood and affect. Normal behavior. Normal judgment and thought content.   Urinalysis:      Assessment and Plan:  Pregnancy: G2P1001 at [redacted]w[redacted]d  1. Supervision of normal pregnancy in second trimester Patient with less frequent contractions Cervical exam unchanged Abnormal 1 hour glucola. Patient states she will return on Wednesday for 3 hour test  Preterm labor symptoms and general obstetric precautions including but not limited to vaginal bleeding, contractions, leaking of fluid and fetal movement were reviewed in detail with the patient. Please refer to After Visit Summary for other counseling recommendations.  Return in about 2 weeks (around 11/18/2015).   Mora Bellman, MD

## 2015-11-06 ENCOUNTER — Other Ambulatory Visit: Payer: Medicaid Other

## 2015-11-13 ENCOUNTER — Other Ambulatory Visit: Payer: Medicaid Other

## 2015-11-13 DIAGNOSIS — O99810 Abnormal glucose complicating pregnancy: Secondary | ICD-10-CM

## 2015-11-14 LAB — GLUCOSE TOLERANCE, 3 HOURS
GLUCOSE 3 HOUR GTT: 65 mg/dL — AB (ref 70–144)
Glucose Tolerance, 1 hour: 136 mg/dL (ref 70–189)
Glucose Tolerance, 2 hour: 113 mg/dL (ref 70–164)
Glucose Tolerance, Fasting: 80 mg/dL (ref 65–99)

## 2015-11-18 ENCOUNTER — Ambulatory Visit (INDEPENDENT_AMBULATORY_CARE_PROVIDER_SITE_OTHER): Payer: Medicaid Other | Admitting: Family Medicine

## 2015-11-18 VITALS — BP 105/55 | HR 81 | Temp 98.2°F | Wt 198.0 lb

## 2015-11-18 DIAGNOSIS — Z3482 Encounter for supervision of other normal pregnancy, second trimester: Secondary | ICD-10-CM | POA: Diagnosis not present

## 2015-11-18 DIAGNOSIS — Z3492 Encounter for supervision of normal pregnancy, unspecified, second trimester: Secondary | ICD-10-CM

## 2015-11-18 LAB — POCT URINALYSIS DIP (DEVICE)
Bilirubin Urine: NEGATIVE
GLUCOSE, UA: NEGATIVE mg/dL
HGB URINE DIPSTICK: NEGATIVE
KETONES UR: NEGATIVE mg/dL
Nitrite: NEGATIVE
Protein, ur: NEGATIVE mg/dL
SPECIFIC GRAVITY, URINE: 1.025 (ref 1.005–1.030)
UROBILINOGEN UA: 0.2 mg/dL (ref 0.0–1.0)
pH: 7 (ref 5.0–8.0)

## 2015-11-18 NOTE — Progress Notes (Signed)
Subjective:  Jillian Bishop is a 19 y.o. G2P1001 at [redacted]w[redacted]d being seen today for ongoing prenatal care.  She is currently monitored for the following issues for this low-risk pregnancy and has Supervision of normal pregnancy in second trimester on her problem list.  Patient reports backache.  Contractions: Irregular. Vag. Bleeding: None.  Movement: Present. Denies leaking of fluid.   The following portions of the patient's history were reviewed and updated as appropriate: allergies, current medications, past family history, past medical history, past social history, past surgical history and problem list. Problem list updated.  Objective:   Filed Vitals:   11/18/15 1053  BP: 105/55  Pulse: 81  Temp: 98.2 F (36.8 C)  Weight: 198 lb (89.812 kg)    Fetal Status: Fetal Heart Rate (bpm): 138 Fundal Height: 30 cm Movement: Present  Presentation: Vertex  General:  Alert, oriented and cooperative. Patient is in no acute distress.  Skin: Skin is warm and dry. No rash noted.   Cardiovascular: Normal heart rate noted  Respiratory: Normal respiratory effort, no problems with respiration noted  Abdomen: Soft, gravid, appropriate for gestational age. Pain/Pressure: Present     Pelvic: Vag. Bleeding: None     Cervical exam performed Dilation: 1 Effacement (%): 30    Extremities: Normal range of motion.  Edema: None  Mental Status: Normal mood and affect. Normal behavior. Normal judgment and thought content.   Urinalysis: Urine Protein: Negative Urine Glucose: Negative  Assessment and Plan:  Pregnancy: G2P1001 at [redacted]w[redacted]d  1. Supervision of normal pregnancy in second trimester -updated box - next visit discuss contraception -Cervix is unchanged despite contractions  Preterm labor symptoms and general obstetric precautions including but not limited to vaginal bleeding, contractions, leaking of fluid and fetal movement were reviewed in detail with the patient. Please refer to After Visit Summary  for other counseling recommendations.  Return in about 2 weeks (around 12/02/2015) for Routine prenatal care.   Caren Macadam, MD

## 2015-11-18 NOTE — Progress Notes (Signed)
Breastfeeding tip of the week reviewed Urine: Moderate leukocytes

## 2015-11-18 NOTE — Patient Instructions (Addendum)
Safe Medications in Pregnancy   Acne: Benzoyl Peroxide Salicylic Acid  Backache/Headache: Tylenol: 2 regular strength every 4 hours OR              2 Extra strength every 6 hours  Colds/Coughs/Allergies: Benadryl (alcohol free) 25 mg every 6 hours as needed Breath right strips Claritin Cepacol throat lozenges Chloraseptic throat spray Cold-Eeze- up to three times per day Cough drops, alcohol free Flonase (by prescription only) Guaifenesin Mucinex Robitussin DM (plain only, alcohol free) Saline nasal spray/drops Sudafed (pseudoephedrine) & Actifed ** use only after [redacted] weeks gestation and if you do not have high blood pressure Tylenol Vicks Vaporub Zinc lozenges Zyrtec   Constipation: Colace Ducolax suppositories Fleet enema Glycerin suppositories Metamucil Milk of magnesia Miralax Senokot Smooth move tea  Diarrhea: Kaopectate Imodium A-D  *NO pepto Bismol  Hemorrhoids: Anusol Anusol HC Preparation H Tucks  Indigestion: Tums Maalox Mylanta Zantac  Pepcid  Insomnia: Benadryl (alcohol free) 64m every 6 hours as needed Tylenol PM Unisom, no Gelcaps  Leg Cramps: Tums MagGel  Nausea/Vomiting:  Bonine Dramamine Emetrol Ginger extract Sea bands Meclizine  Nausea medication to take during pregnancy:  Unisom (doxylamine succinate 25 mg tablets) Take one tablet daily at bedtime. If symptoms are not adequately controlled, the dose can be increased to a maximum recommended dose of two tablets daily (1/2 tablet in the morning, 1/2 tablet mid-afternoon and one at bedtime). Vitamin B6 1066mtablets. Take one tablet twice a day (up to 200 mg per day).  Skin Rashes: Aveeno products Benadryl cream or 254mvery 6 hours as needed Calamine Lotion 1% cortisone cream  Yeast infection: Gyne-lotrimin 7 Monistat 7   **If taking multiple medications, please check labels to avoid duplicating the same active ingredients **take medication as directed on  the label ** Do not exceed 4000 mg of tylenol in 24 hours **Do not take medications that contain aspirin or ibuprofen      Thinking About Waterbirth???  You must attend a [facility] every [facility] from 7-9pm  [employer] calling [phone] online at [email or web address] us [place] certificate from the class   Waterbirth supplies needed for [facility]/[facility] patients:  Our practice has a BirHeritage manager a Box tub at the hospital that you can borrow  You will need to purchase an accessory kit that has all needed supplies through [phone] online $175.00  Or you can purchase the supplies separately: o Single-use disposable tub liner for BirMorgan Stanley a Box (REGULAR size) o New garden hose labeled "lead-free", "suitable for drinking water", o Electric drain pump to remove water (We recommend 792 gallon per hour or greater pump.)  o  "non-toxic" OR "water potable" o Garden hose to remove the dirty water o Fish net o Bathing suit top (optional) o Long-handled mirror (optional)  [email or web address] tubs for ~ $120 if you would rather purchase your own tub  [employer] (www.thelaborladies.com) $275 for tub rental/set-up & take down/kit    Things that would prevent you from having a waterbirth:  Premature, <37wks  Previous cesarean birth  Presence of thick meconium-stained fluid  Multiple gestation (Twins, triplets, etc.)  Uncontrolled diabetes  Hypertension  Heavy vaginal bleeding  Non-reassuring fetal heart rate  Active infection (MRSA, etc.)  If your labor has to be induced and induction method requires continuous monitoring of the baby's heart rate  Other risks/issues identified by your  obstetrical provider

## 2015-11-26 ENCOUNTER — Telehealth: Payer: Self-pay | Admitting: Student

## 2015-11-26 NOTE — Telephone Encounter (Signed)
Patient is 32 weeks, and called to say she was feeling pressure. After speaking with RN's, and the provider. Patient was told to go to [facility] for evaluation.

## 2015-11-28 ENCOUNTER — Telehealth: Payer: Self-pay

## 2015-11-28 NOTE — Telephone Encounter (Signed)
Pt called stating she has been leaking fluid for several days. Today she was at work and had to go and by more panties do to leaking.She was advised to go to [facility] to be check out.

## 2015-12-03 ENCOUNTER — Ambulatory Visit (INDEPENDENT_AMBULATORY_CARE_PROVIDER_SITE_OTHER): Payer: Medicaid Other | Admitting: Advanced Practice Midwife

## 2015-12-03 VITALS — BP 119/60 | HR 86

## 2015-12-03 DIAGNOSIS — O368130 Decreased fetal movements, third trimester, not applicable or unspecified: Secondary | ICD-10-CM | POA: Diagnosis present

## 2015-12-03 DIAGNOSIS — O368131 Decreased fetal movements, third trimester, fetus 1: Secondary | ICD-10-CM

## 2015-12-03 NOTE — Patient Instructions (Signed)
Fetal Movement Counts  Patient Name: __________________________________________________ Patient Due Date: ____________________  Performing a fetal movement count is highly recommended in high-risk pregnancies, but it is good for every pregnant woman to do. Your health care provider may ask you to start counting fetal movements at 28 weeks of the pregnancy. Fetal movements often increase:  · After eating a full meal.  · After physical activity.  · After eating or drinking something sweet or cold.  · At rest.  Pay attention to when you feel the baby is most active. This will help you notice a pattern of your baby's sleep and wake cycles and what factors contribute to an increase in fetal movement. It is important to perform a fetal movement count at the same time each day when your baby is normally most active.   HOW TO COUNT FETAL MOVEMENTS  1. Find a quiet and comfortable area to sit or lie down on your left side. Lying on your left side provides the best blood and oxygen circulation to your baby.  2. Write down the day and time on a sheet of paper or in a journal.  3. Start counting kicks, flutters, swishes, rolls, or jabs in a 2-hour period. You should feel at least 10 movements within 2 hours.  4. If you do not feel 10 movements in 2 hours, wait 2-3 hours and count again. Look for a change in the pattern or not enough counts in 2 hours.  SEEK MEDICAL CARE IF:  · You feel less than 10 counts in 2 hours, tried twice.  · There is no movement in over an hour.  · The pattern is changing or taking longer each day to reach 10 counts in 2 hours.  · You feel the baby is not moving as he or she usually does.  Date: ____________ Movements: ____________ Start time: ____________ Finish time: ____________   Date: ____________ Movements: ____________ Start time: ____________ Finish time: ____________  Date: ____________ Movements: ____________ Start time: ____________ Finish time: ____________  Date: ____________ Movements:  ____________ Start time: ____________ Finish time: ____________  Date: ____________ Movements: ____________ Start time: ____________ Finish time: ____________  Date: ____________ Movements: ____________ Start time: ____________ Finish time: ____________  Date: ____________ Movements: ____________ Start time: ____________ Finish time: ____________  Date: ____________ Movements: ____________ Start time: ____________ Finish time: ____________   Date: ____________ Movements: ____________ Start time: ____________ Finish time: ____________  Date: ____________ Movements: ____________ Start time: ____________ Finish time: ____________  Date: ____________ Movements: ____________ Start time: ____________ Finish time: ____________  Date: ____________ Movements: ____________ Start time: ____________ Finish time: ____________  Date: ____________ Movements: ____________ Start time: ____________ Finish time: ____________  Date: ____________ Movements: ____________ Start time: ____________ Finish time: ____________  Date: ____________ Movements: ____________ Start time: ____________ Finish time: ____________   Date: ____________ Movements: ____________ Start time: ____________ Finish time: ____________  Date: ____________ Movements: ____________ Start time: ____________ Finish time: ____________  Date: ____________ Movements: ____________ Start time: ____________ Finish time: ____________  Date: ____________ Movements: ____________ Start time: ____________ Finish time: ____________  Date: ____________ Movements: ____________ Start time: ____________ Finish time: ____________  Date: ____________ Movements: ____________ Start time: ____________ Finish time: ____________  Date: ____________ Movements: ____________ Start time: ____________ Finish time: ____________   Date: ____________ Movements: ____________ Start time: ____________ Finish time: ____________  Date: ____________ Movements: ____________ Start time: ____________ Finish  time: ____________  Date: ____________ Movements: ____________ Start time: ____________ Finish time: ____________  Date: ____________ Movements: ____________ Start time:   ____________ Finish time: ____________  Date: ____________ Movements: ____________ Start time: ____________ Finish time: ____________  Date: ____________ Movements: ____________ Start time: ____________ Finish time: ____________  Date: ____________ Movements: ____________ Start time: ____________ Finish time: ____________   Date: ____________ Movements: ____________ Start time: ____________ Finish time: ____________  Date: ____________ Movements: ____________ Start time: ____________ Finish time: ____________  Date: ____________ Movements: ____________ Start time: ____________ Finish time: ____________  Date: ____________ Movements: ____________ Start time: ____________ Finish time: ____________  Date: ____________ Movements: ____________ Start time: ____________ Finish time: ____________  Date: ____________ Movements: ____________ Start time: ____________ Finish time: ____________  Date: ____________ Movements: ____________ Start time: ____________ Finish time: ____________   Date: ____________ Movements: ____________ Start time: ____________ Finish time: ____________  Date: ____________ Movements: ____________ Start time: ____________ Finish time: ____________  Date: ____________ Movements: ____________ Start time: ____________ Finish time: ____________  Date: ____________ Movements: ____________ Start time: ____________ Finish time: ____________  Date: ____________ Movements: ____________ Start time: ____________ Finish time: ____________  Date: ____________ Movements: ____________ Start time: ____________ Finish time: ____________  Date: ____________ Movements: ____________ Start time: ____________ Finish time: ____________   Date: ____________ Movements: ____________ Start time: ____________ Finish time: ____________  Date: ____________  Movements: ____________ Start time: ____________ Finish time: ____________  Date: ____________ Movements: ____________ Start time: ____________ Finish time: ____________  Date: ____________ Movements: ____________ Start time: ____________ Finish time: ____________  Date: ____________ Movements: ____________ Start time: ____________ Finish time: ____________  Date: ____________ Movements: ____________ Start time: ____________ Finish time: ____________  Date: ____________ Movements: ____________ Start time: ____________ Finish time: ____________   Date: ____________ Movements: ____________ Start time: ____________ Finish time: ____________  Date: ____________ Movements: ____________ Start time: ____________ Finish time: ____________  Date: ____________ Movements: ____________ Start time: ____________ Finish time: ____________  Date: ____________ Movements: ____________ Start time: ____________ Finish time: ____________  Date: ____________ Movements: ____________ Start time: ____________ Finish time: ____________  Date: ____________ Movements: ____________ Start time: ____________ Finish time: ____________     This information is not intended to replace advice given to you by your health care provider. Make sure you discuss any questions you have with your health care provider.     Document Released: 12/23/2006 Document Revised: 12/14/2014 Document Reviewed: 09/19/2012  Elsevier Interactive Patient Education ©2016 Elsevier Inc.

## 2015-12-03 NOTE — Progress Notes (Signed)
Pt reports no fetal movement x2 days

## 2015-12-03 NOTE — Progress Notes (Signed)
Subjective:  Jillian Bishop is a 19 y.o. G2P1001 at [redacted]w[redacted]d being seen today for ongoing prenatal care. Today's visit is a problem visit for decreased fetal movement. She reports she has not felt movement in 2 days. She is currently monitored for the following issues for this low-risk pregnancy and has Supervision of normal pregnancy in second trimester on her problem list.  Patient reports no bleeding, no contractions, no cramping and no leaking.   .  .  Movement: (!) Decreased. Denies leaking of fluid.   The following portions of the patient's history were reviewed and updated as appropriate: allergies, current medications, past family history, past medical history, past social history, past surgical history and problem list. Problem list updated.  Objective:   Filed Vitals:   12/03/15 1622  BP: 119/60  Pulse: 86    Fetal Status:     Movement: (!) Decreased     General:  Alert, oriented and cooperative. Patient is in no acute distress.  Skin: Skin is warm and dry. No rash noted.   Cardiovascular: Normal heart rate noted  Respiratory: Normal respiratory effort, no problems with respiration noted  Abdomen: Soft, gravid, appropriate for gestational age.       Pelvic:       Cervical exam deferred        Extremities: Normal range of motion.     Mental Status: Normal mood and affect. Normal behavior. Normal judgment and thought content.   Urinalysis:      Assessment and Plan:  Pregnancy: G2P1001 at [redacted]w[redacted]d  1. Decreased fetal movement, third trimester, fetus 1  - Fetal nonstress test. Reviewed reactive NST with pt and family members.  Pt reports fetal movement while in clinic today.  Reviewed fetal kick counts, reasons to return to hospital.  Preterm labor symptoms and general obstetric precautions including but not limited to vaginal bleeding, contractions, leaking of fluid and fetal movement were reviewed in detail with the patient. Please refer to After Visit Summary for other  counseling recommendations.  No Follow-up on file.   Elvera Maria, CNM

## 2015-12-08 NOTE — L&D Delivery Note (Signed)
Patient is 20 y.o. G2P1001 [redacted]w[redacted]d admitted SOL and SROM  Delivery Note At 3:59 AM a viable female was delivered via Vaginal, Spontaneous Delivery (Presentation: left; Occiput Anterior). Nuchal tight x 2, reduced after delivery. APGAR: 3,8. 1 minute APGAR likely due to Fentanyl dose. Initial oxygen saturation was 80% on room air. Improved with blow by and narcan x1. Now maintaining saturations on room air. weight: pending.   Placenta status: Intact, Spontaneous.  Cord: 3 vessels with the following complications: None.  Cord pH: not obtained   Anesthesia: None  Episiotomy: None Lacerations: Periurethral abrasions bilaterally, hemostatic and not requiring repair Suture Repair: n/a Est. Blood Loss (mL): 250  Mom to postpartum.  Baby to Couplet care / Skin to Skin.  Jillian Bishop 01/20/2016, 4:19 AM  Upon arrival patient was complete and pushing. She pushed with good maternal effort to deliver a healthy baby boy. Baby delivered without difficulty, was noted to have good tone and place on maternal abdomen for oral suctioning, drying and stimulation. Delayed cord clamping performed. Placenta delivered intact with 3V cord. Vaginal canal and perineum was inspected and bilateral periurethral abrasions noted that did not require repair; hemostatic. Pitocin was started and uterus massaged until bleeding slowed. Counts of sharps, instruments, and lap pads were all correct.   Jillian Houseman, MD PGY 1 Family Medicine

## 2015-12-09 ENCOUNTER — Ambulatory Visit (INDEPENDENT_AMBULATORY_CARE_PROVIDER_SITE_OTHER): Payer: Medicaid Other | Admitting: Obstetrics & Gynecology

## 2015-12-09 VITALS — BP 119/61 | HR 81 | Temp 98.4°F | Wt 203.3 lb

## 2015-12-09 DIAGNOSIS — Z8742 Personal history of other diseases of the female genital tract: Secondary | ICD-10-CM

## 2015-12-09 DIAGNOSIS — Z3483 Encounter for supervision of other normal pregnancy, third trimester: Secondary | ICD-10-CM

## 2015-12-09 DIAGNOSIS — Z8759 Personal history of other complications of pregnancy, childbirth and the puerperium: Secondary | ICD-10-CM | POA: Insufficient documentation

## 2015-12-09 DIAGNOSIS — Z3493 Encounter for supervision of normal pregnancy, unspecified, third trimester: Secondary | ICD-10-CM

## 2015-12-09 LAB — POCT URINALYSIS DIP (DEVICE)
BILIRUBIN URINE: NEGATIVE
Glucose, UA: NEGATIVE mg/dL
Hgb urine dipstick: NEGATIVE
KETONES UR: NEGATIVE mg/dL
NITRITE: NEGATIVE
Protein, ur: NEGATIVE mg/dL
Specific Gravity, Urine: 1.02 (ref 1.005–1.030)
Urobilinogen, UA: 0.2 mg/dL (ref 0.0–1.0)
pH: 7 (ref 5.0–8.0)

## 2015-12-09 NOTE — Patient Instructions (Signed)

## 2015-12-09 NOTE — Progress Notes (Signed)
Subjective:pelvic pressure  Jillian Bishop is a 20 y.o. G2P1001 at [redacted]w[redacted]d being seen today for ongoing prenatal care.  She is currently monitored for the following issues for this low-risk pregnancy and has Supervision of normal pregnancy in second trimester on her problem list.  Patient reports occasional contractions.  Contractions: Irregular. Vag. Bleeding: None.  Movement: Present. Denies leaking of fluid.   The following portions of the patient's history were reviewed and updated as appropriate: allergies, current medications, past family history, past medical history, past social history, past surgical history and problem list. Problem list updated.  Objective:   Filed Vitals:   12/09/15 1037  BP: 119/61  Pulse: 81  Temp: 98.4 F (36.9 C)  Weight: 203 lb 4.8 oz (92.216 kg)    Fetal Status: Fetal Heart Rate (bpm): 129   Movement: Present     General:  Alert, oriented and cooperative. Patient is in no acute distress.  Skin: Skin is warm and dry. No rash noted.   Cardiovascular: Normal heart rate noted  Respiratory: Normal respiratory effort, no problems with respiration noted  Abdomen: Soft, gravid, appropriate for gestational age. Pain/Pressure: Present     Pelvic: Vag. Bleeding: None Vag D/C Character: Mucous   Cervical exam performed        Extremities: Normal range of motion.  Edema: Trace  Mental Status: Normal mood and affect. Normal behavior. Normal judgment and thought content.   Urinalysis: Urine Protein: Negative Urine Glucose: Negative  Assessment and Plan:  Pregnancy: G2P1001 at [redacted]w[redacted]d  1. Supervision of normal pregnancy in second trimester No sign PTL  Preterm labor symptoms and general obstetric precautions including but not limited to vaginal bleeding, contractions, leaking of fluid and fetal movement were reviewed in detail with the patient. Please refer to After Visit Summary for other counseling recommendations.  No Follow-up on file. [place] for growth in 2  week due to SGA baby 5 years ago  Woodroe Mode, MD

## 2015-12-09 NOTE — Progress Notes (Signed)
States was having a lot of contractions last night and mucous discharge, finally stopped.

## 2015-12-23 ENCOUNTER — Other Ambulatory Visit (HOSPITAL_COMMUNITY)
Admission: RE | Admit: 2015-12-23 | Discharge: 2015-12-23 | Disposition: A | Discharge status: Home / Self Care | Payer: Medicaid Other | Source: Ambulatory Visit | Attending: Obstetrics & Gynecology | Admitting: Obstetrics & Gynecology

## 2015-12-23 ENCOUNTER — Ambulatory Visit (INDEPENDENT_AMBULATORY_CARE_PROVIDER_SITE_OTHER): Payer: Medicaid Other | Admitting: Obstetrics & Gynecology

## 2015-12-23 VITALS — BP 124/63 | HR 91 | Temp 98.8°F | Wt 200.1 lb

## 2015-12-23 DIAGNOSIS — Z3483 Encounter for supervision of other normal pregnancy, third trimester: Secondary | ICD-10-CM

## 2015-12-23 DIAGNOSIS — Z3493 Encounter for supervision of normal pregnancy, unspecified, third trimester: Secondary | ICD-10-CM

## 2015-12-23 DIAGNOSIS — Z113 Encounter for screening for infections with a predominantly sexual mode of transmission: Secondary | ICD-10-CM | POA: Diagnosis present

## 2015-12-23 LAB — POCT URINALYSIS DIP (DEVICE)
BILIRUBIN URINE: NEGATIVE
GLUCOSE, UA: 500 mg/dL — AB
Hgb urine dipstick: NEGATIVE
Nitrite: NEGATIVE
PROTEIN: NEGATIVE mg/dL
Specific Gravity, Urine: 1.02 (ref 1.005–1.030)
Urobilinogen, UA: 1 mg/dL (ref 0.0–1.0)
pH: 7 (ref 5.0–8.0)

## 2015-12-23 LAB — OB RESULTS CONSOLE GBS: STREP GROUP B AG: NEGATIVE

## 2015-12-23 NOTE — Progress Notes (Signed)
Subjective: Jillian Bishop for growth tomorrow  Jillian Bishop is a 20 y.o. G2P1001 at [redacted]w[redacted]d being seen today for ongoing prenatal care.  She is currently monitored for the following issues for this high-risk pregnancy and has Supervision of normal pregnancy in third trimester and History of prior pregnancy with SGA newborn on her problem list.  Patient reports no complaints.  Contractions: Irregular. Vag. Bleeding: None.  Movement: Present. Denies leaking of fluid.   The following portions of the patient's history were reviewed and updated as appropriate: allergies, current medications, past family history, past medical history, past social history, past surgical history and problem list. Problem list updated.  Objective:   Filed Vitals:   12/23/15 1116  Pulse: 91  Temp: 98.8 F (37.1 C)  Weight: 200 lb 1.6 oz (90.765 kg)    Fetal Status:     Movement: Present     General:  Alert, oriented and cooperative. Patient is in no acute distress.  Skin: Skin is warm and dry. No rash noted.   Cardiovascular: Normal heart rate noted  Respiratory: Normal respiratory effort, no problems with respiration noted  Abdomen: Soft, gravid, appropriate for gestational age. Pain/Pressure: Present     Pelvic: Vag. Bleeding: None     Cervical exam performed        Extremities: Normal range of motion.  Edema: None  Mental Status: Normal mood and affect. Normal behavior. Normal judgment and thought content.   Urinalysis: Urine Protein: Negative Urine Glucose: 3+  Assessment and Plan:  Pregnancy: G2P1001 at [redacted]w[redacted]d  1. Supervision of normal pregnancy in third trimester Jillian Bishop f/u tomorrow for growth - Culture, beta strep (group b only) - GC/Chlamydia probe amp ([place])not at [facility]  Preterm labor symptoms and general obstetric precautions including but not limited to vaginal bleeding, contractions, leaking of fluid and fetal movement were reviewed in detail with the patient. Please refer to After Visit Summary for  other counseling recommendations.  RTC 1 week  Woodroe Mode, MD

## 2015-12-23 NOTE — Patient Instructions (Signed)

## 2015-12-23 NOTE — Progress Notes (Signed)
Pt reports a lot of pain and pressure in vaginal area

## 2015-12-24 ENCOUNTER — Ambulatory Visit (HOSPITAL_COMMUNITY)
Admission: RE | Admit: 2015-12-24 | Discharge: 2015-12-24 | Disposition: A | Discharge status: Home / Self Care | Payer: Medicaid Other | Source: Ambulatory Visit | Attending: Obstetrics & Gynecology | Admitting: Obstetrics & Gynecology

## 2015-12-24 DIAGNOSIS — Z3493 Encounter for supervision of normal pregnancy, unspecified, third trimester: Secondary | ICD-10-CM

## 2015-12-24 DIAGNOSIS — O09293 Supervision of pregnancy with other poor reproductive or obstetric history, third trimester: Secondary | ICD-10-CM | POA: Insufficient documentation

## 2015-12-24 DIAGNOSIS — Z3A36 36 weeks gestation of pregnancy: Secondary | ICD-10-CM | POA: Insufficient documentation

## 2015-12-24 DIAGNOSIS — Z8759 Personal history of other complications of pregnancy, childbirth and the puerperium: Secondary | ICD-10-CM

## 2015-12-24 LAB — GC/CHLAMYDIA PROBE AMP (~~LOC~~) NOT AT ARMC
CHLAMYDIA, DNA PROBE: NEGATIVE
NEISSERIA GONORRHEA: NEGATIVE

## 2015-12-26 LAB — CULTURE, BETA STREP (GROUP B ONLY)

## 2016-01-01 ENCOUNTER — Ambulatory Visit (INDEPENDENT_AMBULATORY_CARE_PROVIDER_SITE_OTHER): Payer: Medicaid Other | Admitting: Certified Nurse Midwife

## 2016-01-01 VITALS — BP 125/54 | HR 74 | Temp 98.3°F | Wt 205.5 lb

## 2016-01-01 DIAGNOSIS — Z8742 Personal history of other diseases of the female genital tract: Secondary | ICD-10-CM | POA: Diagnosis not present

## 2016-01-01 DIAGNOSIS — Z3483 Encounter for supervision of other normal pregnancy, third trimester: Secondary | ICD-10-CM

## 2016-01-01 DIAGNOSIS — Z3493 Encounter for supervision of normal pregnancy, unspecified, third trimester: Secondary | ICD-10-CM

## 2016-01-01 DIAGNOSIS — Z8759 Personal history of other complications of pregnancy, childbirth and the puerperium: Secondary | ICD-10-CM

## 2016-01-01 LAB — POCT URINALYSIS DIP (DEVICE)
Bilirubin Urine: NEGATIVE
Glucose, UA: NEGATIVE mg/dL
Hgb urine dipstick: NEGATIVE
Ketones, ur: NEGATIVE mg/dL
Nitrite: NEGATIVE
Protein, ur: NEGATIVE mg/dL
Specific Gravity, Urine: 1.02 (ref 1.005–1.030)
Urobilinogen, UA: 0.2 mg/dL (ref 0.0–1.0)
pH: 7 (ref 5.0–8.0)

## 2016-01-01 NOTE — Progress Notes (Signed)
Subjective:  Jillian Bishop is a 20 y.o. G2P1001 at [redacted]w[redacted]d being seen today for ongoing prenatal care.  She is currently monitored for the following issues for this low-risk pregnancy and has Supervision of normal pregnancy in third trimester and History of prior pregnancy with SGA newborn on her problem list.  Patient reports no complaints.  Contractions: Irregular. Vag. Bleeding: None.  Movement: Present. Denies leaking of fluid.   The following portions of the patient's history were reviewed and updated as appropriate: allergies, current medications, past family history, past medical history, past social history, past surgical history and problem list. Problem list updated.  Objective:   Filed Vitals:   01/01/16 1124  BP: 125/54  Pulse: 74  Temp: 98.3 F (36.8 C)  Weight: 205 lb 8 oz (93.214 kg)    Fetal Status: Fetal Heart Rate (bpm): 137   Movement: Present     General:  Alert, oriented and cooperative. Patient is in no acute distress.  Skin: Skin is warm and dry. No rash noted.   Cardiovascular: Normal heart rate noted  Respiratory: Normal respiratory effort, no problems with respiration noted  Abdomen: Soft, gravid, appropriate for gestational age. Pain/Pressure: Absent     Pelvic: Vag. Bleeding: None     Cervical exam deferred        Extremities: Normal range of motion.  Edema: None  Mental Status: Normal mood and affect. Normal behavior. Normal judgment and thought content.   Urinalysis: Urine Protein: Negative Urine Glucose: Negative  Assessment and Plan:  Pregnancy: G2P1001 at [redacted]w[redacted]d  1. History of prior pregnancy with SGA newborn   2. Supervision of normal pregnancy in third trimester   Term labor symptoms and general obstetric precautions including but not limited to vaginal bleeding, contractions, leaking of fluid and fetal movement were reviewed in detail with the patient. Please refer to After Visit Summary for other counseling recommendations.  Return in  about 1 week (around 01/08/2016).   Larey Days, CNM

## 2016-01-01 NOTE — Patient Instructions (Signed)

## 2016-01-01 NOTE — Progress Notes (Signed)
Breastfeeding tip of the week reviewed. 

## 2016-01-08 ENCOUNTER — Ambulatory Visit (INDEPENDENT_AMBULATORY_CARE_PROVIDER_SITE_OTHER): Payer: Medicaid Other | Admitting: Advanced Practice Midwife

## 2016-01-08 VITALS — BP 125/70 | HR 82 | Temp 98.4°F | Wt 205.7 lb

## 2016-01-08 DIAGNOSIS — Z3483 Encounter for supervision of other normal pregnancy, third trimester: Secondary | ICD-10-CM

## 2016-01-08 DIAGNOSIS — Z3493 Encounter for supervision of normal pregnancy, unspecified, third trimester: Secondary | ICD-10-CM

## 2016-01-08 DIAGNOSIS — Z8742 Personal history of other diseases of the female genital tract: Secondary | ICD-10-CM

## 2016-01-08 DIAGNOSIS — Z8759 Personal history of other complications of pregnancy, childbirth and the puerperium: Secondary | ICD-10-CM

## 2016-01-08 LAB — POCT URINALYSIS DIP (DEVICE)
Bilirubin Urine: NEGATIVE
GLUCOSE, UA: NEGATIVE mg/dL
HGB URINE DIPSTICK: NEGATIVE
KETONES UR: NEGATIVE mg/dL
Nitrite: NEGATIVE
Protein, ur: NEGATIVE mg/dL
SPECIFIC GRAVITY, URINE: 1.02 (ref 1.005–1.030)
Urobilinogen, UA: 1 mg/dL (ref 0.0–1.0)
pH: 7 (ref 5.0–8.0)

## 2016-01-08 NOTE — Patient Instructions (Signed)
Braxton Hicks Contractions °Contractions of the uterus can occur throughout pregnancy. Contractions are not always a sign that you are in labor.  °WHAT ARE BRAXTON HICKS CONTRACTIONS?  °Contractions that occur before labor are called Braxton Hicks contractions, or false labor. Toward the end of pregnancy (32-34 weeks), these contractions can develop more often and may become more forceful. This is not true labor because these contractions do not result in opening (dilatation) and thinning of the cervix. They are sometimes difficult to tell apart from true labor because these contractions can be forceful and people have different pain tolerances. You should not feel embarrassed if you go to the hospital with false labor. Sometimes, the only way to tell if you are in true labor is for your health care provider to look for changes in the cervix. °If there are no prenatal problems or other health problems associated with the pregnancy, it is completely safe to be sent home with false labor and await the onset of true labor. °HOW CAN YOU TELL THE DIFFERENCE BETWEEN TRUE AND FALSE LABOR? °False Labor °· The contractions of false labor are usually shorter and not as hard as those of true labor.   °· The contractions are usually irregular.   °· The contractions are often felt in the front of the lower abdomen and in the groin.   °· The contractions may go away when you walk around or change positions while lying down.   °· The contractions get weaker and are shorter lasting as time goes on.   °· The contractions do not usually become progressively stronger, regular, and closer together as with true labor.   °True Labor °· Contractions in true labor last 30-70 seconds, become very regular, usually become more intense, and increase in frequency.   °· The contractions do not go away with walking.   °· The discomfort is usually felt in the top of the uterus and spreads to the lower abdomen and low back.   °· True labor can be  determined by your health care provider with an exam. This will show that the cervix is dilating and getting thinner.   °WHAT TO REMEMBER °· Keep up with your usual exercises and follow other instructions given by your health care provider.   °· Take medicines as directed by your health care provider.   °· Keep your regular prenatal appointments.   °· Eat and drink lightly if you think you are going into labor.   °· If Braxton Hicks contractions are making you uncomfortable:   °¨ Change your position from lying down or resting to walking, or from walking to resting.   °¨ Sit and rest in a tub of warm water.   °¨ Drink 2-3 glasses of water. Dehydration may cause these contractions.   °¨ Do slow and deep breathing several times an hour.   °WHEN SHOULD I SEEK IMMEDIATE MEDICAL CARE? °Seek immediate medical care if: °· Your contractions become stronger, more regular, and closer together.   °· You have fluid leaking or gushing from your vagina.   °· You have a fever.   °· You pass blood-tinged mucus.   °· You have vaginal bleeding.   °· You have continuous abdominal pain.   °· You have low back pain that you never had before.   °· You feel your baby's head pushing down and causing pelvic pressure.   °· Your baby is not moving as much as it used to.   °  °This information is not intended to replace advice given to you by your health care provider. Make sure you discuss any questions you have with your health care   provider. °  °Document Released: 11/23/2005 Document Revised: 11/28/2013 Document Reviewed: 09/04/2013 °Elsevier Interactive Patient Education ©2016 Elsevier Inc. ° °

## 2016-01-08 NOTE — Progress Notes (Signed)
Subjective:  Jillian Bishop is a 20 y.o. G2P1001 at [redacted]w[redacted]d being seen today for ongoing prenatal care.  She is currently monitored for the following issues for this low-risk pregnancy and has Supervision of normal pregnancy in third trimester and History of prior pregnancy with SGA newborn on her problem list.  Patient reports occasional contractions and pelvic pressure.  Contractions: Irregular. Vag. Bleeding: None.  Movement: Present. Denies leaking of fluid.   The following portions of the patient's history were reviewed and updated as appropriate: allergies, current medications, past family history, past medical history, past social history, past surgical history and problem list. Problem list updated.  Objective:   Filed Vitals:   01/08/16 1059  BP: 125/70  Pulse: 82  Temp: 98.4 F (36.9 C)  Weight: 205 lb 11.2 oz (93.305 kg)    Fetal Status: Fetal Heart Rate (bpm): 140   Movement: Present     General:  Alert, oriented and cooperative. Patient is in no acute distress.  Skin: Skin is warm and dry. No rash noted.   Cardiovascular: Normal heart rate noted  Respiratory: Normal respiratory effort, no problems with respiration noted  Abdomen: Soft, gravid, appropriate for gestational age. Pain/Pressure: Present     Pelvic: Vag. Bleeding: None     Cervical exam performed  2/40/-3  Extremities: Normal range of motion.  Edema: Trace  Mental Status: Normal mood and affect. Normal behavior. Normal judgment and thought content.   Urinalysis: Urine Protein: Negative Urine Glucose: Negative  Assessment and Plan:  Pregnancy: G2P1001 at [redacted]w[redacted]d  1. History of prior pregnancy with SGA newborn   Term labor symptoms and general obstetric precautions including but not limited to vaginal bleeding, contractions, leaking of fluid and fetal movement were reviewed in detail with the patient. Please refer to After Visit Summary for other counseling recommendations.  Return in about 1 week (around  01/15/2016).   Manya Silvas, CNM

## 2016-01-08 NOTE — Progress Notes (Signed)
Edema- feet   Pain/pressure- bottom, dull ache in lower back  Educated pt on Benefits of Breastfeeding for Mom

## 2016-01-15 ENCOUNTER — Ambulatory Visit (INDEPENDENT_AMBULATORY_CARE_PROVIDER_SITE_OTHER): Payer: Medicaid Other | Admitting: Advanced Practice Midwife

## 2016-01-15 VITALS — BP 140/71 | HR 89 | Temp 98.5°F | Wt 205.0 lb

## 2016-01-15 DIAGNOSIS — Z3493 Encounter for supervision of normal pregnancy, unspecified, third trimester: Secondary | ICD-10-CM

## 2016-01-15 DIAGNOSIS — Z3483 Encounter for supervision of other normal pregnancy, third trimester: Secondary | ICD-10-CM

## 2016-01-15 LAB — POCT URINALYSIS DIP (DEVICE)
BILIRUBIN URINE: NEGATIVE
Glucose, UA: NEGATIVE mg/dL
HGB URINE DIPSTICK: NEGATIVE
KETONES UR: NEGATIVE mg/dL
Nitrite: NEGATIVE
PH: 7 (ref 5.0–8.0)
PROTEIN: NEGATIVE mg/dL
SPECIFIC GRAVITY, URINE: 1.02 (ref 1.005–1.030)
Urobilinogen, UA: 1 mg/dL (ref 0.0–1.0)

## 2016-01-15 NOTE — Progress Notes (Signed)
Retake BP 134/70 Subjective:  Jillian Bishop is a 20 y.o. G2P1001 at [redacted]w[redacted]d being seen today for ongoing prenatal care.  She is currently monitored for the following issues for this low-risk pregnancy and has Supervision of normal pregnancy in third trimester and History of prior pregnancy with SGA newborn on her problem list.  Patient reports no complaints.  Contractions: Irregular. Vag. Bleeding: None.  Movement: Present. Denies leaking of fluid.   The following portions of the patient's history were reviewed and updated as appropriate: allergies, current medications, past family history, past medical history, past social history, past surgical history and problem list. Problem list updated.  Objective:   Filed Vitals:   01/15/16 1112  BP: 140/71  Pulse: 89  Temp: 98.5 F (36.9 C)  Weight: 205 lb (92.987 kg)    Fetal Status: Fetal Heart Rate (bpm): 135 Fundal Height: 40 cm Movement: Present  Presentation: Vertex  General:  Alert, oriented and cooperative. Patient is in no acute distress.  Skin: Skin is warm and dry. No rash noted.   Cardiovascular: Normal heart rate noted  Respiratory: Normal respiratory effort, no problems with respiration noted  Abdomen: Soft, gravid, appropriate for gestational age. Pain/Pressure: Present     Pelvic: Vag. Bleeding: None     Cervical exam performed Dilation: 4 Effacement (%): 50 Station: -2 Membranes swept at pt request.    Extremities: Normal range of motion.  Edema: Trace  Mental Status: Normal mood and affect. Normal behavior. Normal judgment and thought content.   Urinalysis: Urine Protein: Negative Urine Glucose: Negative  Assessment and Plan:  Pregnancy: G2P1001 at [redacted]w[redacted]d  1. Supervision of normal pregnancy in third trimester --Membranes swept at pt request.  Discussed maternal positions to encourage optimal fetal positioning and encouraged walking and resting daily.  Term labor symptoms and general obstetric precautions including but  not limited to vaginal bleeding, contractions, leaking of fluid and fetal movement were reviewed in detail with the patient. Please refer to After Visit Summary for other counseling recommendations.  Return in about 1 week (around 01/22/2016).   Elvera Maria, CNM

## 2016-01-15 NOTE — Progress Notes (Signed)
Pt requests membrane sweep

## 2016-01-17 ENCOUNTER — Inpatient Hospital Stay (HOSPITAL_COMMUNITY)
Admission: AD | Admit: 2016-01-17 | Discharge: 2016-01-17 | Disposition: A | Discharge status: Home / Self Care | Payer: Medicaid Other | Source: Ambulatory Visit | Attending: Family Medicine | Admitting: Family Medicine

## 2016-01-17 ENCOUNTER — Encounter (HOSPITAL_COMMUNITY): Payer: Self-pay

## 2016-01-17 DIAGNOSIS — O9989 Other specified diseases and conditions complicating pregnancy, childbirth and the puerperium: Secondary | ICD-10-CM | POA: Insufficient documentation

## 2016-01-17 DIAGNOSIS — J45909 Unspecified asthma, uncomplicated: Secondary | ICD-10-CM | POA: Diagnosis not present

## 2016-01-17 DIAGNOSIS — Z3A39 39 weeks gestation of pregnancy: Secondary | ICD-10-CM | POA: Insufficient documentation

## 2016-01-17 DIAGNOSIS — Z833 Family history of diabetes mellitus: Secondary | ICD-10-CM | POA: Insufficient documentation

## 2016-01-17 DIAGNOSIS — Z8249 Family history of ischemic heart disease and other diseases of the circulatory system: Secondary | ICD-10-CM | POA: Insufficient documentation

## 2016-01-17 DIAGNOSIS — O471 False labor at or after 37 completed weeks of gestation: Secondary | ICD-10-CM | POA: Insufficient documentation

## 2016-01-17 DIAGNOSIS — R197 Diarrhea, unspecified: Secondary | ICD-10-CM | POA: Insufficient documentation

## 2016-01-17 DIAGNOSIS — O99513 Diseases of the respiratory system complicating pregnancy, third trimester: Secondary | ICD-10-CM | POA: Diagnosis not present

## 2016-01-17 DIAGNOSIS — O479 False labor, unspecified: Secondary | ICD-10-CM

## 2016-01-17 LAB — CBC WITH DIFFERENTIAL/PLATELET
BASOS PCT: 0 %
Basophils Absolute: 0 10*3/uL (ref 0.0–0.1)
EOS ABS: 0.1 10*3/uL (ref 0.0–0.7)
EOS PCT: 0 %
HCT: 33.3 % — ABNORMAL LOW (ref 36.0–46.0)
HEMOGLOBIN: 10.8 g/dL — AB (ref 12.0–15.0)
LYMPHS ABS: 2.2 10*3/uL (ref 0.7–4.0)
Lymphocytes Relative: 19 %
MCH: 25.5 pg — AB (ref 26.0–34.0)
MCHC: 32.4 g/dL (ref 30.0–36.0)
MCV: 78.7 fL (ref 78.0–100.0)
MONO ABS: 0.5 10*3/uL (ref 0.1–1.0)
MONOS PCT: 5 %
NEUTROS PCT: 76 %
Neutro Abs: 8.7 10*3/uL — ABNORMAL HIGH (ref 1.7–7.7)
PLATELETS: 360 10*3/uL (ref 150–400)
RBC: 4.23 MIL/uL (ref 3.87–5.11)
RDW: 15.7 % — AB (ref 11.5–15.5)
WBC: 11.5 10*3/uL — ABNORMAL HIGH (ref 4.0–10.5)

## 2016-01-17 LAB — URINALYSIS, ROUTINE W REFLEX MICROSCOPIC
Bilirubin Urine: NEGATIVE
Glucose, UA: NEGATIVE mg/dL
Hgb urine dipstick: NEGATIVE
KETONES UR: NEGATIVE mg/dL
NITRITE: NEGATIVE
PH: 6.5 (ref 5.0–8.0)
Protein, ur: NEGATIVE mg/dL
Specific Gravity, Urine: 1.02 (ref 1.005–1.030)

## 2016-01-17 LAB — URINE MICROSCOPIC-ADD ON

## 2016-01-17 NOTE — Discharge Instructions (Signed)
Reasons to return to MAU:  1.  Contractions are  5 minutes apart or less, each last 1 minute, these have been going on for 1-2 hours, and you cannot walk or talk during them 2.  You have a large gush of fluid, or a trickle of fluid that will not stop and you have to wear a pad 3.  You have bleeding that is bright red, heavier than spotting--like menstrual bleeding (spotting can be normal in early labor or after a check of your cervix) 4.  You do not feel the baby moving like he/she normally does  Diarrhea Diarrhea is frequent loose and watery bowel movements. It can cause you to feel weak and dehydrated. Dehydration can cause you to become tired and thirsty, have a dry mouth, and have decreased urination that often is dark yellow. Diarrhea is a sign of another problem, most often an infection that will not last long. In most cases, diarrhea typically lasts 2-3 days. However, it can last longer if it is a sign of something more serious. It is important to treat your diarrhea as directed by your caregiver to lessen or prevent future episodes of diarrhea. CAUSES  Some common causes include:  Gastrointestinal infections caused by viruses, bacteria, or parasites.  Food poisoning or food allergies.  Certain medicines, such as antibiotics, chemotherapy, and laxatives.  Artificial sweeteners and fructose.  Digestive disorders. HOME CARE INSTRUCTIONS  Ensure adequate fluid intake (hydration): Have 1 cup (8 oz) of fluid for each diarrhea episode. Avoid fluids that contain simple sugars or sports drinks, fruit juices, whole milk products, and sodas. Your urine should be clear or pale yellow if you are drinking enough fluids. Hydrate with an oral rehydration solution that you can purchase at pharmacies, retail stores, and online. You can prepare an oral rehydration solution at home by mixing the following ingredients together:   - tsp table salt.   tsp baking soda.   tsp salt substitute containing  potassium chloride.  1  tablespoons sugar.  1 L (34 oz) of water.  Certain foods and beverages may increase the speed at which food moves through the gastrointestinal (GI) tract. These foods and beverages should be avoided and include:  Caffeinated and alcoholic beverages.  High-fiber foods, such as raw fruits and vegetables, nuts, seeds, and whole grain breads and cereals.  Foods and beverages sweetened with sugar alcohols, such as xylitol, sorbitol, and mannitol.  Some foods may be well tolerated and may help thicken stool including:  Starchy foods, such as rice, toast, pasta, low-sugar cereal, oatmeal, grits, baked potatoes, crackers, and bagels.  Bananas.  Applesauce.  Add probiotic-rich foods to help increase healthy bacteria in the GI tract, such as yogurt and fermented milk products.  Wash your hands well after each diarrhea episode.  Only take over-the-counter or prescription medicines as directed by your caregiver.  Take a warm bath to relieve any burning or pain from frequent diarrhea episodes. SEEK IMMEDIATE MEDICAL CARE IF:   You are unable to keep fluids down.  You have persistent vomiting.  You have blood in your stool, or your stools are black and tarry.  You do not urinate in 6-8 hours, or there is only a small amount of very dark urine.  You have abdominal pain that increases or localizes.  You have weakness, dizziness, confusion, or light-headedness.  You have a severe headache.  Your diarrhea gets worse or does not get better.  You have a fever or persistent symptoms for more  than 2-3 days.  You have a fever and your symptoms suddenly get worse. MAKE SURE YOU:   Understand these instructions.  Will watch your condition.  Will get help right away if you are not doing well or get worse.   This information is not intended to replace advice given to you by your health care provider. Make sure you discuss any questions you have with your health  care provider.   Document Released: 11/13/2002 Document Revised: 12/14/2014 Document Reviewed: 07/31/2012 Elsevier Interactive Patient Education [employer].

## 2016-01-17 NOTE — MAU Note (Signed)
Wednesday was checked in [facility] 4 cm, has been having pelvic pain and back pain, 2 episodes of diarrhea, many times during the night.

## 2016-01-17 NOTE — MAU Provider Note (Signed)
Chief Complaint:  Pelvic Pain; Back Pain; and Vaginal Discharge   None     HPI: Jillian Bishop is a 20 y.o. G2P1001 at [redacted]w[redacted]d who presents to maternity admissions reporting diarrhea x 5-6 times last night and 2 more today, pelvic pain and cramping off and on x 2 days, and mucus like vaginal discharge x 2 days.  She reports she vomits every day x 1 but this is unchanged during her entire pregnancy.  She reports she was seen in the office 2 days ago and was 4 cm dilated and had her membranes swept by CNM.  Afterwards, she reports more cramping but it has been irregular and onset of mucus like discharge that was blood tinged at first but no bleeding today.  She had a 4 hour labor with her first baby and is concerned that her labor may be fast this time.  She has not tried any medications for her diarrhea, cramping, or discharge.  Nothing seems to make these symptoms better or worse.  She reports good fetal movement, denies LOF, vaginal bleeding, vaginal itching/burning, urinary symptoms, h/a, dizziness, n/v, or fever/chills.    HPI  Past Medical History: Past Medical History  Diagnosis Date  . Asthma     Past obstetric history: OB History  Gravida Para Term Preterm AB SAB TAB Ectopic Multiple Living  2 1 1       1     # Outcome Date GA Lbr Len/2nd Weight Sex Delivery Anes PTL Lv  2 Current           1 Term 10/03/10 [redacted]w[redacted]d  2.381 kg (5 lb 4 oz) M Vag-Spont None N Y      Past Surgical History: Past Surgical History  Procedure Laterality Date  . Appendectomy      Family History: Family History  Problem Relation Age of Onset  . Hypertension Mother   . Hypertension Father   . Diabetes Father   . Hypertension Sister   . Heart disease Paternal Grandmother   . Hypertension Paternal Grandmother     Social History: Social History  Substance Use Topics  . Smoking status: Never Smoker   . Smokeless tobacco: Never Used  . Alcohol Use: No    Allergies: No Known Allergies  Meds:   Prescriptions prior to admission  Medication Sig Dispense Refill Last Dose  . albuterol (PROVENTIL HFA;VENTOLIN HFA) 108 (90 BASE) MCG/ACT inhaler Inhale 2 puffs into the lungs every 6 (six) hours as needed for wheezing or shortness of breath.   Taking  . Prenat w/o A Vit-FeFum-FePo-FA (CONCEPT OB) 130-92.4-1 MG CAPS Take 1 tablet by mouth daily. 30 capsule 12 Taking    ROS:  Review of Systems  Constitutional: Negative for fever, chills and fatigue.  Eyes: Negative for visual disturbance.  Respiratory: Negative for shortness of breath.   Cardiovascular: Negative for chest pain.  Gastrointestinal: Positive for vomiting and diarrhea. Negative for nausea, abdominal pain, constipation and blood in stool.  Genitourinary: Positive for vaginal discharge and pelvic pain. Negative for dysuria, flank pain, vaginal bleeding, difficulty urinating and vaginal pain.  Musculoskeletal: Positive for back pain.  Neurological: Negative for dizziness and headaches.  Psychiatric/Behavioral: Negative.      I have reviewed patient's Past Medical Hx, Surgical Hx, Family Hx, Social Hx, medications and allergies.   Physical Exam   Patient Vitals for the past 24 hrs:  BP Temp Temp src Pulse Resp Height Weight  01/17/16 1721 139/72 mmHg 98.7 F (37.1 C) Oral 80  18 5\' 3"  (1.6 m) 92.987 kg (205 lb)   Constitutional: Well-developed, well-nourished female in no acute distress.  Cardiovascular: normal rate Respiratory: normal effort GI: Abd soft, non-tender, gravid appropriate for gestational age.  MS: Extremities nontender, no edema, normal ROM Neurologic: Alert and oriented x 4.  GU: Neg CVAT.  Dilation: 4.5 Effacement (%): 50 Station: -2 Presentation: Vertex Exam by:: Fatima Blank CNM   No blood on glove following exam   FHT:  Baseline 130, moderate variability, accelerations present, no decelerations Contractions: rare, mild to palpation   Labs: Results for orders placed or performed  during the hospital encounter of 01/17/16 (from the past 24 hour(s))  Urinalysis, Routine w reflex microscopic (not at [facility])     Status: Abnormal   Collection Time: 01/17/16  5:05 PM  Result Value Ref Range   Color, Urine YELLOW YELLOW   APPearance CLEAR CLEAR   Specific Gravity, Urine 1.020 1.005 - 1.030   pH 6.5 5.0 - 8.0   Glucose, UA NEGATIVE NEGATIVE mg/dL   Hgb urine dipstick NEGATIVE NEGATIVE   Bilirubin Urine NEGATIVE NEGATIVE   Ketones, ur NEGATIVE NEGATIVE mg/dL   Protein, ur NEGATIVE NEGATIVE mg/dL   Nitrite NEGATIVE NEGATIVE   Leukocytes, UA SMALL (A) NEGATIVE  Urine microscopic-add on     Status: Abnormal   Collection Time: 01/17/16  5:05 PM  Result Value Ref Range   Squamous Epithelial / LPF 6-30 (A) NONE SEEN   WBC, UA 6-30 0 - 5 WBC/hpf   RBC / HPF 0-5 0 - 5 RBC/hpf   Bacteria, UA MANY (A) NONE SEEN   Urine-Other MUCOUS PRESENT   CBC with Differential     Status: Abnormal   Collection Time: 01/17/16  6:17 PM  Result Value Ref Range   WBC 11.5 (H) 4.0 - 10.5 K/uL   RBC 4.23 3.87 - 5.11 MIL/uL   Hemoglobin 10.8 (L) 12.0 - 15.0 g/dL   HCT 33.3 (L) 36.0 - 46.0 %   MCV 78.7 78.0 - 100.0 fL   MCH 25.5 (L) 26.0 - 34.0 pg   MCHC 32.4 30.0 - 36.0 g/dL   RDW 15.7 (H) 11.5 - 15.5 %   Platelets 360 150 - 400 K/uL   Neutrophils Relative % 76 %   Neutro Abs 8.7 (H) 1.7 - 7.7 K/uL   Lymphocytes Relative 19 %   Lymphs Abs 2.2 0.7 - 4.0 K/uL   Monocytes Relative 5 %   Monocytes Absolute 0.5 0.1 - 1.0 K/uL   Eosinophils Relative 0 %   Eosinophils Absolute 0.1 0.0 - 0.7 K/uL   Basophils Relative 0 %   Basophils Absolute 0.0 0.0 - 0.1 K/uL   O/POS/-- (09/21 0940)  Imaging:  [place] [facility] Ob Follow Up  12/26/2015  OBSTETRICAL ULTRASOUND: This exam was performed within a [facility]. The OB US report was generated in the [facility] system, and faxed to the ordering physician.  This report is available in the [facility]. See the [facility] Obstetric US report via the Image  Link.   MAU Course/MDM: I have ordered labs and reviewed results.  Pt is well-appearing, afebrile, with improving symptoms without treatment.  Cervix unchanged in 1 hour in MAU, irregular contractions on toco, mild to palpation.  Diarrhea may be noninfectious or viral in origin.  Will treat symptoms with increased PO fluids and Imodium OTC as needed.  Pt to f/u in clinic as scheduled, return to MAU for emergencies/signs of labor.  Pt stable at  time of discharge.  Assessment: 1. Threatened labor at term   2. Diarrhea, unspecified type     Plan: Discharge home  Labor precautions and fetal kick counts      Follow-up Information    Follow up with [facility].   Specialty:  Obstetrics and Gynecology   Why:  As scheduled, Return to MAU as needed for emergencies   Contact information:   Roy McClure [phone]      Please follow up.   Why:  Imodium is safe to use in pregnancy, follow the directions on the package.       Medication List    TAKE these medications        albuterol 108 (90 Base) MCG/ACT inhaler  Commonly known as:  PROVENTIL HFA;VENTOLIN HFA  Inhale 2 puffs into the lungs every 6 (six) hours as needed for wheezing or shortness of breath.     CONCEPT OB 130-92.4-1 MG Caps  Take 1 tablet by mouth daily.        Fatima Blank Certified Nurse-Midwife 01/17/2016 7:01 PM

## 2016-01-20 ENCOUNTER — Inpatient Hospital Stay (HOSPITAL_COMMUNITY)
Admission: AD | Admit: 2016-01-20 | Discharge: 2016-01-21 | DRG: 775 | Disposition: A | Discharge status: Home / Self Care | Payer: Medicaid Other | Source: Ambulatory Visit | Attending: Obstetrics & Gynecology | Admitting: Obstetrics & Gynecology

## 2016-01-20 ENCOUNTER — Encounter (HOSPITAL_COMMUNITY): Payer: Self-pay

## 2016-01-20 DIAGNOSIS — Z3A39 39 weeks gestation of pregnancy: Secondary | ICD-10-CM | POA: Diagnosis not present

## 2016-01-20 DIAGNOSIS — Z8249 Family history of ischemic heart disease and other diseases of the circulatory system: Secondary | ICD-10-CM | POA: Diagnosis not present

## 2016-01-20 DIAGNOSIS — O4292 Full-term premature rupture of membranes, unspecified as to length of time between rupture and onset of labor: Principal | ICD-10-CM | POA: Diagnosis present

## 2016-01-20 DIAGNOSIS — Z833 Family history of diabetes mellitus: Secondary | ICD-10-CM

## 2016-01-20 DIAGNOSIS — Z3493 Encounter for supervision of normal pregnancy, unspecified, third trimester: Secondary | ICD-10-CM

## 2016-01-20 DIAGNOSIS — IMO0001 Reserved for inherently not codable concepts without codable children: Secondary | ICD-10-CM

## 2016-01-20 HISTORY — DX: Papillomavirus as the cause of diseases classified elsewhere: B97.7

## 2016-01-20 LAB — CBC
HEMATOCRIT: 34 % — AB (ref 36.0–46.0)
Hemoglobin: 11.3 g/dL — ABNORMAL LOW (ref 12.0–15.0)
MCH: 25.6 pg — AB (ref 26.0–34.0)
MCHC: 33.2 g/dL (ref 30.0–36.0)
MCV: 76.9 fL — AB (ref 78.0–100.0)
Platelets: 379 10*3/uL (ref 150–400)
RBC: 4.42 MIL/uL (ref 3.87–5.11)
RDW: 15.7 % — AB (ref 11.5–15.5)
WBC: 12.4 10*3/uL — ABNORMAL HIGH (ref 4.0–10.5)

## 2016-01-20 LAB — RPR: RPR Ser Ql: NONREACTIVE

## 2016-01-20 LAB — TYPE AND SCREEN
ABO/RH(D): O POS
Antibody Screen: NEGATIVE

## 2016-01-20 MED ORDER — OXYTOCIN 10 UNIT/ML IJ SOLN
2.5000 [IU]/h | INTRAVENOUS | Status: DC
  Filled 2016-01-20: qty 4

## 2016-01-20 MED ORDER — DIBUCAINE 1 % RE OINT: 1.0000 "application " | TOPICAL_OINTMENT | RECTAL | Status: DC | PRN

## 2016-01-20 MED ORDER — LACTATED RINGERS IV SOLN: 500.0000 mL | INTRAVENOUS | Status: DC | PRN

## 2016-01-20 MED ORDER — SENNOSIDES-DOCUSATE SODIUM 8.6-50 MG PO TABS
2.0000 | ORAL_TABLET | ORAL | Status: DC
  Administered 2016-01-20: 2 via ORAL
  Filled 2016-01-20: qty 2

## 2016-01-20 MED ORDER — ONDANSETRON HCL 4 MG/2ML IJ SOLN: 4.0000 mg | INTRAMUSCULAR | Status: DC | PRN

## 2016-01-20 MED ORDER — FENTANYL CITRATE (PF) 100 MCG/2ML IJ SOLN
100.0000 ug | INTRAMUSCULAR | Status: DC | PRN
  Administered 2016-01-20: 100 ug via INTRAVENOUS

## 2016-01-20 MED ORDER — TETANUS-DIPHTH-ACELL PERTUSSIS 5-2.5-18.5 LF-MCG/0.5 IM SUSP: 0.5000 mL | Freq: Once | INTRAMUSCULAR | Status: DC

## 2016-01-20 MED ORDER — PRENATAL MULTIVITAMIN CH
1.0000 | ORAL_TABLET | Freq: Every day | ORAL | Status: DC
  Administered 2016-01-20 – 2016-01-21 (×2): 1 via ORAL
  Filled 2016-01-20 (×2): qty 1

## 2016-01-20 MED ORDER — ONDANSETRON HCL 4 MG PO TABS: 4.0000 mg | ORAL_TABLET | ORAL | Status: DC | PRN

## 2016-01-20 MED ORDER — IBUPROFEN 600 MG PO TABS
600.0000 mg | ORAL_TABLET | Freq: Four times a day (QID) | ORAL | Status: DC
  Administered 2016-01-20 – 2016-01-21 (×5): 600 mg via ORAL
  Filled 2016-01-20 (×5): qty 1

## 2016-01-20 MED ORDER — FLEET ENEMA 7-19 GM/118ML RE ENEM: 1.0000 | ENEMA | RECTAL | Status: DC | PRN

## 2016-01-20 MED ORDER — WITCH HAZEL-GLYCERIN EX PADS: 1.0000 "application " | MEDICATED_PAD | CUTANEOUS | Status: DC | PRN

## 2016-01-20 MED ORDER — LIDOCAINE HCL (PF) 1 % IJ SOLN
30.0000 mL | INTRAMUSCULAR | Status: DC | PRN
  Filled 2016-01-20: qty 30

## 2016-01-20 MED ORDER — FENTANYL CITRATE (PF) 100 MCG/2ML IJ SOLN
INTRAMUSCULAR | Status: AC
  Filled 2016-01-20: qty 2

## 2016-01-20 MED ORDER — CITRIC ACID-SODIUM CITRATE 334-500 MG/5ML PO SOLN: 30.0000 mL | ORAL | Status: DC | PRN

## 2016-01-20 MED ORDER — ACETAMINOPHEN 325 MG PO TABS: 650.0000 mg | ORAL_TABLET | ORAL | Status: DC | PRN

## 2016-01-20 MED ORDER — BENZOCAINE-MENTHOL 20-0.5 % EX AERO: 1.0000 "application " | INHALATION_SPRAY | CUTANEOUS | Status: DC | PRN

## 2016-01-20 MED ORDER — OXYTOCIN BOLUS FROM INFUSION
500.0000 mL | INTRAVENOUS | Status: DC
  Administered 2016-01-20: 500 mL via INTRAVENOUS

## 2016-01-20 MED ORDER — LANOLIN HYDROUS EX OINT: TOPICAL_OINTMENT | CUTANEOUS | Status: DC | PRN

## 2016-01-20 MED ORDER — SIMETHICONE 80 MG PO CHEW: 80.0000 mg | CHEWABLE_TABLET | ORAL | Status: DC | PRN

## 2016-01-20 MED ORDER — LACTATED RINGERS IV SOLN: INTRAVENOUS | Status: DC

## 2016-01-20 MED ORDER — ONDANSETRON HCL 4 MG/2ML IJ SOLN: 4.0000 mg | Freq: Four times a day (QID) | INTRAMUSCULAR | Status: DC | PRN

## 2016-01-20 MED ORDER — DIPHENHYDRAMINE HCL 25 MG PO CAPS: 25.0000 mg | ORAL_CAPSULE | Freq: Four times a day (QID) | ORAL | Status: DC | PRN

## 2016-01-20 MED ORDER — ZOLPIDEM TARTRATE 5 MG PO TABS: 5.0000 mg | ORAL_TABLET | Freq: Every evening | ORAL | Status: DC | PRN

## 2016-01-20 NOTE — H&P (Signed)
LABOR ADMISSION HISTORY AND PHYSICAL  TERIANN BOLLE is a 20 y.o. female G2P1001 with IUP at [redacted]w[redacted]d by 26w6dUS presenting for SOL and SROM @ 0235, clear.  She reports she has not felt fetal movement since 10PM 2/12. She reports, + contractions, no VB, no blurry vision, headaches or peripheral edema, and RUQ pain.  She plans on breast feeding. She is undecided on birth control.  Dating: By 72w6dUS --->  Estimated Date of Delivery: 01/21/16  Sono:    @[redacted]w[redacted]d , CWD, normal anatomy, 344g, 51% EFW @[redacted]w[redacted]d , 2689g, 53% EFW   Prenatal History/Complications: Hx of SGA  Reports no complications with previous pregnancy/delivery  Past Medical History: Past Medical History  Diagnosis Date  . Asthma     Past Surgical History: Past Surgical History  Procedure Laterality Date  . Appendectomy      Obstetrical History: OB History    Gravida Para Term Preterm AB TAB SAB Ectopic Multiple Living   2 1 1       1       Social History: Social History   Social History  . Marital Status: Married    Spouse Name: N/A  . Number of Children: N/A  . Years of Education: N/A   Social History Main Topics  . Smoking status: Never Smoker   . Smokeless tobacco: Never Used  . Alcohol Use: No  . Drug Use: No  . Sexual Activity: No   Other Topics Concern  . None   Social History Narrative    Family History: Family History  Problem Relation Age of Onset  . Hypertension Mother   . Hypertension Father   . Diabetes Father   . Hypertension Sister   . Heart disease Paternal Grandmother   . Hypertension Paternal Grandmother     Allergies: No Known Allergies  Prescriptions prior to admission  Medication Sig Dispense Refill Last Dose  . albuterol (PROVENTIL HFA;VENTOLIN HFA) 108 (90 BASE) MCG/ACT inhaler Inhale 2 puffs into the lungs every 6 (six) hours as needed for wheezing or shortness of breath.   Taking  . Prenat w/o A Vit-FeFum-FePo-FA (CONCEPT OB) 130-92.4-1 MG CAPS Take 1 tablet by mouth  daily. 30 capsule 12 Taking     Review of Systems   All systems reviewed and negative except as stated in HPI  BP 144/73 mmHg  Pulse 87  Temp(Src) 98 F (36.7 C) (Oral)  Resp 20  Ht 5\' 3"  (1.6 m)  SpO2 99%  LMP 04/08/2015 General appearance: alert and cooperative Lungs: clear to auscultation bilaterally Heart: regular rate and rhythm Abdomen: soft, non-tender; bowel sounds normal Extremities: Homans sign is negative, no sign of DVT, edema Fetal monitoringBaseline: 120 bpm, Variability: Good {> 6 bpm), Accelerations: none and Decelerations: decel to about 100bpm noted at 0332 which was prolonged for about 6 minutes. Likely due to fast progression from 5cm to 9cm and fentanyl dose x 1. Afterwards returned to baseline with mod variability.  Uterine activity: every 2-3 mins Dilation: 5 Effacement (%): 100 Station: 0 Exam by:: Joya Gaskins RN   Prenatal labs: ABO, Rh: O/POS/-- (09/21 0940) Antibody: NEG (09/21 0940) Rubella: Immune RPR: NON REAC (11/14 1653)  HBsAg: NEGATIVE (09/21 0940)  HIV: NONREACTIVE (11/14 1653)  GBS: Negative (01/16 0000)  1 hr Glucola: first trimester 143; 3 hr nml; third trimester 140; 3hr test nml Genetic screening: declined  Anatomy US: nml  Prenatal Transfer Tool  Maternal Diabetes: failed 1 hr; passed 3 hr Genetic Screening: too late for primary screen, declined  QUAD  Maternal Ultrasounds/Referrals: Normal Fetal Ultrasounds or other Referrals:  None Maternal Substance Abuse:  No Significant Maternal Medications:  None Significant Maternal Lab Results: Lab values include: Group B Strep negative  No results found for this or any previous visit (from the past 24 hour(s)).  Patient Active Problem List   Diagnosis Date Noted  . History of prior pregnancy with SGA newborn 12/09/2015  . Supervision of normal pregnancy in third trimester 08/28/2015    Assessment: SHEREENA STROBLE is a 20 y.o. G2P1001 at [redacted]w[redacted]d here for SOL and SROM @0235   2/13  #Labor: SOL and SROM #Pain: Fentanyl PRN #FWB: Cat 2; decel to about 100bpm noted at 0332 which was prolonged for about 6 minutes. Likely due to fast progression from 5cm to 9cm and fentanyl dose x 1 shortly after admission. Afterwards returned to baseline with mod variability.  #ID: GBS neg #MOF: breast #MOC: unecided #Circ: outpatient   Smiley Houseman, MD PGY 1 Family Medicine

## 2016-01-20 NOTE — Progress Notes (Signed)
Report called; orders will be placed by MD.

## 2016-01-20 NOTE — Lactation Note (Signed)
This note was copied from a baby's chart. Lactation Consultation Note: This is mothers second child. She has a 22 yr old that she didn't breastfeed. Mother states that her sister breastfed all her children and she wants to try to breastfeed. Mother has a harmony hand pump at home. Mother is not active with [place]. Infant breastfed at 69 am for 20 mins.  Assist mother with hand expression. Observed  large drops of colostrum. Infant placed in football hold and latched using off sided latch. Infant sustained latch for 10-15 mins. Observed a few swallows. Lots of teaching with mother. Reviewed baby and me book on basics. Mother's sister at bedside and very supportive. Mother is aware of available [facility] services.   Patient Name: Jillian Bishop [id number] Date: 01/20/2016     Maternal Data    Feeding Feeding Type: Breast Fed Length of feed: 20 min  LATCH Score/Interventions                      Lactation Tools Discussed/Used     Consult Status      Darla Lesches 01/20/2016, 12:21 PM

## 2016-01-20 NOTE — MAU Note (Signed)
Pt here with c/o contractions since about 0200, Denies any bleeding. Water broke while out in lobby. Reports positive fetal movement. Denies any problems with the pregnancy.

## 2016-01-20 NOTE — Progress Notes (Signed)
UR chart review completed.  

## 2016-01-21 MED ORDER — SENNOSIDES-DOCUSATE SODIUM 8.6-50 MG PO TABS: 2.0000 | ORAL_TABLET | Freq: Every evening | ORAL | Status: DC | PRN

## 2016-01-21 MED ORDER — IBUPROFEN 600 MG PO TABS: 600.0000 mg | ORAL_TABLET | Freq: Four times a day (QID) | ORAL | Status: DC

## 2016-01-21 NOTE — Discharge Summary (Signed)
OB Discharge Summary     Patient Name: Jillian Bishop DOB: March 21, 1996 MRN: [id number]  Date of admission: 01/20/2016 Delivering MD: Smiley Houseman   Date of discharge: 01/21/2016  Admitting diagnosis: 39.6 WEEKS CTX Intrauterine pregnancy: [redacted]w[redacted]d     Secondary diagnosis:  Active Problems:   Active labor at term  Additional problems: none     Discharge diagnosis: Term Pregnancy Delivered                                                                                                Post partum procedures:none  Augmentation: none  Complications: None  Hospital course:  Onset of Labor With Vaginal Delivery     20 y.o. yo [id number] at [redacted]w[redacted]d was admitted in Active Labor on 01/20/2016. Patient had an uncomplicated labor course as follows:  Membrane Rupture Time/Date: 2:35 AM ,01/20/2016   Intrapartum Procedures: Episiotomy: None [1]                                         Lacerations:  Periurethral [8]  Patient had a delivery of a Viable infant. 01/20/2016  Information for the patient's newborn:  Ameri, Garciaramirez [id number]  Delivery Method: Vaginal, Spontaneous Delivery (Filed from Delivery Summary)    Pateint had an uncomplicated postpartum course.  She is ambulating, tolerating a regular diet, passing flatus, and urinating well. Patient is discharged home in stable condition on 01/21/2016.    Physical exam  Filed Vitals:   01/20/16 1100 01/20/16 1330 01/20/16 1819 01/21/16 0655  BP: 132/62 115/54 116/90 112/55  Pulse: 74 81 83 73  Temp: 98.5 F (36.9 C) 98.2 F (36.8 C) 98.5 F (36.9 C) 98.1 F (36.7 C)  TempSrc: Oral Oral Oral   Resp: 20 20 18 18   Height:      SpO2:       General: alert, cooperative and no distress Lochia: appropriate Uterine Fundus: firm Incision: N/A DVT Evaluation: No cords or calf tenderness. No significant calf/ankle edema. Labs: Lab Results  Component Value Date   WBC 12.4* 01/20/2016   HGB 11.3* 01/20/2016   HCT 34.0*  01/20/2016   MCV 76.9* 01/20/2016   PLT 379 01/20/2016   No flowsheet data found.  Discharge instruction: per After Visit Summary and "Baby and Me Booklet".  After visit meds:    Medication List    TAKE these medications        acetaminophen 325 MG tablet  Commonly known as:  TYLENOL  Take 325 mg by mouth every 6 (six) hours as needed for mild pain.     albuterol 108 (90 Base) MCG/ACT inhaler  Commonly known as:  PROVENTIL HFA;VENTOLIN HFA  Inhale 2 puffs into the lungs every 6 (six) hours as needed for wheezing or shortness of breath.     CONCEPT OB 130-92.4-1 MG Caps  Take 1 tablet by mouth daily.     ibuprofen 600 MG tablet  Commonly known as:  ADVIL,MOTRIN  Take 1 tablet (600 mg total)  by mouth every 6 (six) hours.     senna-docusate 8.6-50 MG tablet  Commonly known as:  Senokot-S  Take 2 tablets by mouth at bedtime as needed for mild constipation.        Diet: routine diet  Activity: Advance as tolerated. Pelvic rest for 6 weeks.   Outpatient follow up:6 weeks Follow up Appt:Future Appointments Date Time Provider [place]  02/27/2016 12:40 PM Larey Days, CNM [facility]   Follow up Visit:No Follow-up on file.  Postpartum contraception: condoms; larc discussed  Newborn Data: Live born female  Birth Weight: 7 lb 0.1 oz (3177 g) APGAR: 3, 8  Baby Feeding: Breast Disposition:home with mother   01/21/2016 Desma Maxim, MD

## 2016-01-21 NOTE — Lactation Note (Addendum)
This note was copied from a baby's chart. Lactation Consultation Note: Mother just finished a 15 min feeding when I arrived in the room. Mother has been exclusively breastfeeding infant.  Mother confirms hearing infant swallow and denies any soreness of her nipples. Reviewed treatment to prevent severe engorgement. Mother advised to continue to breastfeed infant 8-12 times in 77 hour and with feeding cues. Discussed cluster feeding. Mother receptive to all teaching.   Patient Name: Jillian Bishop [id number] Date: 01/21/2016 Reason for consult: Follow-up assessment   Maternal Data    Feeding Feeding Type: Breast Fed Length of feed: 15 min  LATCH Score/Interventions                      Lactation Tools Discussed/Used     Consult Status Consult Status: Complete    Darla Lesches 01/21/2016, 9:55 AM

## 2016-01-21 NOTE — Progress Notes (Cosign Needed)
Post Partum Day 1  Subjective:  Jillian Bishop is a 20 y.o. [id number] [redacted]w[redacted]d s/p SVD at 102.  No acute events overnight.  Pt denies problems with ambulating, voiding or po intake.  She denies nausea or vomiting.  Pain is well controlled.  She has had flatus. She has had bowel movement.  Lochia Small that is improving.  Patient is still undecided on birth control method.  Method of Feeding: breast  Objective: BP 116/90 mmHg  Pulse 83  Temp(Src) 98.5 F (36.9 C) (Oral)  Resp 18  Ht 5\' 3"  (1.6 m)  SpO2 99%  LMP 04/08/2015  Breastfeeding? Unknown  Physical Exam:  General: alert, cooperative and no distress Chest: CTAB Heart: RRR no m/r/g Abdomen: +BS, soft, nontender, fundus firm at/below umbilicus Uterine Fundus: firm DVT Evaluation: No evidence of DVT seen on physical exam. Extremities: trace edema   Recent Labs  01/20/16 0245  HGB 11.3*  HCT 34.0*    Assessment/Plan:  ASSESSMENT: Jillian Bishop is a 20 y.o. [id number] [redacted]w[redacted]d ppd #1 s/p NSVD doing well.  1. PPD#1- Patient is doing well and has had no acute events overnight. Her pain is well controlled on current pain regimen. She was encouraged to ambulate and increase PO intake.  2. Breast feeding- lactation seeing patient, no acute concerns  Plan for discharge tomorrow   LOS: 1 day   Ocie Cornfield 01/21/2016, 6:54 AM

## 2016-01-21 NOTE — Discharge Instructions (Signed)

## 2016-01-22 ENCOUNTER — Encounter: Payer: Medicaid Other | Admitting: Family

## 2016-01-26 NOTE — Progress Notes (Signed)
Post Partum Day 1  Subjective:  Jillian Bishop is a 20 y.o. [id number] [redacted]w[redacted]d s/p SVD at 93.  No acute events overnight.  Pt denies problems with ambulating, voiding or po intake.  She denies nausea or vomiting.  Pain is well controlled.  She has had flatus. She has had bowel movement.  Lochia Small that is improving.  Patient is still undecided on birth control method.  Method of Feeding: breast  Objective: BP 112/55 mmHg  Pulse 73  Temp(Src) 98.1 F (36.7 C) (Oral)  Resp 18  Ht 5\' 3"  (1.6 m)  SpO2 99%  LMP 04/08/2015  Breastfeeding? Unknown  Physical Exam:  General: alert, cooperative and no distress Chest: CTAB Heart: RRR no m/r/g Abdomen: +BS, soft, nontender, fundus firm at/below umbilicus Uterine Fundus: firm DVT Evaluation: No evidence of DVT seen on physical exam. Extremities: trace edema  No results for input(s): HGB, HCT in the last 72 hours.  Assessment/Plan:  ASSESSMENT: Jillian Bishop is a 20 y.o. [id number] [redacted]w[redacted]d ppd #1 s/p NSVD doing well.  1. PPD#1- Patient is doing well and has had no acute events overnight. Her pain is well controlled on current pain regimen. She was encouraged to ambulate and increase PO intake.  2. Breast feeding- lactation seeing patient, no acute concerns  Plan for discharge tomorrow   LOS: 1 day   Clemmons,Lori Grissett 01/21/16

## 2016-01-26 NOTE — Progress Notes (Signed)
Post Partum Day 1  Subjective:  Jillian Bishop is a 20 y.o. [id number] [redacted]w[redacted]d s/p SVD at 57.  No acute events overnight.  Pt denies problems with ambulating, voiding or po intake.  She denies nausea or vomiting.  Pain is well controlled.  She has had flatus. She has had bowel movement.  Lochia Small that is improving.  Patient is still undecided on birth control method.  Method of Feeding: breast  Objective: BP 112/55 mmHg  Pulse 73  Temp(Src) 98.1 F (36.7 C) (Oral)  Resp 18  Ht 5\' 3"  (1.6 m)  SpO2 99%  LMP 04/08/2015  Breastfeeding? Unknown  Physical Exam:  General: alert, cooperative and no distress Chest: CTAB Heart: RRR no m/r/g Abdomen: +BS, soft, nontender, fundus firm at/below umbilicus Uterine Fundus: firm DVT Evaluation: No evidence of DVT seen on physical exam. Extremities: trace edema  No results for input(s): HGB, HCT in the last 72 hours.  Assessment/Plan:  ASSESSMENT: Jillian Bishop is a 20 y.o. [id number] [redacted]w[redacted]d ppd #1 s/p NSVD doing well.  1. PPD#1- Patient is doing well and has had no acute events overnight. Her pain is well controlled on current pain regimen. She was encouraged to ambulate and increase PO intake.  2. Breast feeding- lactation seeing patient, no acute concerns  Plan for discharge tomorrow   LOS: 1 day   Lauralie Blacksher Grissett 01/21/2016

## 2016-01-29 ENCOUNTER — Encounter: Payer: Medicaid Other | Admitting: Advanced Practice Midwife

## 2016-02-27 ENCOUNTER — Ambulatory Visit: Payer: Medicaid Other | Admitting: Certified Nurse Midwife

## 2016-03-12 ENCOUNTER — Encounter: Payer: Self-pay | Admitting: Certified Nurse Midwife

## 2016-03-12 ENCOUNTER — Ambulatory Visit (INDEPENDENT_AMBULATORY_CARE_PROVIDER_SITE_OTHER): Payer: Medicaid Other | Admitting: Certified Nurse Midwife

## 2016-03-12 MED ORDER — NORETHIN ACE-ETH ESTRAD-FE 1-20 MG-MCG(24) PO CHEW: 1.0000 | CHEWABLE_TABLET | Freq: Every day | ORAL | Status: DC

## 2016-03-12 NOTE — Patient Instructions (Signed)
Oral Contraception Use Oral contraceptive pills (OCPs) are medicines taken to prevent pregnancy. OCPs work by preventing the ovaries from releasing eggs. The hormones in OCPs also cause the cervical mucus to thicken, preventing the sperm from entering the uterus. The hormones also cause the uterine lining to become thin, not allowing a fertilized egg to attach to the inside of the uterus. OCPs are highly effective when taken exactly as prescribed. However, OCPs do not prevent sexually transmitted diseases (STDs). Safe sex practices, such as using condoms along with an OCP, can help prevent STDs. Before taking OCPs, you may have a physical exam and Pap test. Your health care provider may also order blood tests if necessary. Your health care provider will make sure you are a good candidate for oral contraception. Discuss with your health care provider the possible side effects of the OCP you may be prescribed. When starting an OCP, it can take 2 to 3 months for the body to adjust to the changes in hormone levels in your body.  HOW TO TAKE ORAL CONTRACEPTIVE PILLS Your health care provider may advise you on how to start taking the first cycle of OCPs. Otherwise, you can:   Start on day 1 of your menstrual period. You will not need any backup contraceptive protection with this start time.   Start on the first Sunday after your menstrual period or the day you get your prescription. In these cases, you will need to use backup contraceptive protection for the first week.   Start the pill at any time of your cycle. If you take the pill within 5 days of the start of your period, you are protected against pregnancy right away. In this case, you will not need a backup form of birth control. If you start at any other time of your menstrual cycle, you will need to use another form of birth control for 7 days. If your OCP is the type called a minipill, it will protect you from pregnancy after taking it for 2 days (48  hours). After you have started taking OCPs:   If you forget to take 1 pill, take it as soon as you remember. Take the next pill at the regular time.   If you miss 2 or more pills, call your health care provider because different pills have different instructions for missed doses. Use backup birth control until your next menstrual period starts.   If you use a 28-day pack that contains inactive pills and you miss 1 of the last 7 pills (pills with no hormones), it will not matter. Throw away the rest of the non-hormone pills and start a new pill pack.  No matter which day you start the OCP, you will always start a new pack on that same day of the week. Have an extra pack of OCPs and a backup contraceptive method available in case you miss some pills or lose your OCP pack.  HOME CARE INSTRUCTIONS   Do not smoke.   Always use a condom to protect against STDs. OCPs do not protect against STDs.   Use a calendar to mark your menstrual period days.   Read the information and directions that came with your OCP. Talk to your health care provider if you have questions.  SEEK MEDICAL CARE IF:   You develop nausea and vomiting.   You have abnormal vaginal discharge or bleeding.   You develop a rash.   You miss your menstrual period.   You are losing   your hair.   You need treatment for mood swings or depression.   You get dizzy when taking the OCP.   You develop acne from taking the OCP.   You become pregnant.  SEEK IMMEDIATE MEDICAL CARE IF:   You develop chest pain.   You develop shortness of breath.   You have an uncontrolled or severe headache.   You develop numbness or slurred speech.   You develop visual problems.   You develop pain, redness, and swelling in the legs.    This information is not intended to replace advice given to you by your health care provider. Make sure you discuss any questions you have with your health care provider.   Document  Released: 11/12/2011 Document Revised: 12/14/2014 Document Reviewed: 05/14/2013 Elsevier Interactive Patient Education 2016 Elsevier Inc.  

## 2016-03-12 NOTE — Progress Notes (Signed)
Subjective:     Jillian Bishop is a 20 y.o. female who presents for a postpartum visit. She is 7 weeks postpartum following a spontaneous vaginal delivery. I have fully reviewed the prenatal and intrapartum course. The delivery was at [redacted]w[redacted]d gestational weeks. Outcome: spontaneous vaginal delivery. Anesthesia: none. Postpartum course has been uneventful. Baby's course has been uneventful. Baby is feeding by bottle - [employer] Good Start Soy. Bleeding no bleeding. Bowel function is normal. Bladder function is normal. Patient is not sexually active. Contraception method is abstinence. Postpartum depression screening: negative.  The following portions of the patient's history were reviewed and updated as appropriate: allergies, current medications, past family history, past medical history, past social history and past surgical history.  Review of Systems Pertinent items are noted in HPI.   Objective:    Ht 5\' 3"  (1.6 m)  Wt 196 lb 8 oz (89.132 kg)  BMI 34.82 kg/m2  LMP 02/24/2016 (Approximate)  Breastfeeding? No  General:  alert and cooperative   Breasts:    Lungs: clear to auscultation bilaterally  Heart:  normal apical impulse  Abdomen: soft, non-tender; bowel sounds normal; no masses,  no organomegaly   Vulva:    Vagina:   Cervix:    Corpus:   Adnexa:    Rectal Exam:         Assessment:   7 postpartum exam. Pap smear not done at today's visit.   Plan:    1. Contraception: OCP (estrogen/progesterone) 2.  3. Follow up in: 3 months for annual exam with pap due to previous pap with HPV or as needed.

## 2016-06-24 ENCOUNTER — Emergency Department (HOSPITAL_COMMUNITY)
Admission: EM | Admit: 2016-06-24 | Discharge: 2016-06-25 | Disposition: A | Discharge status: Home / Self Care | Payer: Medicaid Other | Attending: Emergency Medicine | Admitting: Emergency Medicine

## 2016-06-24 ENCOUNTER — Encounter (HOSPITAL_COMMUNITY): Payer: Self-pay | Admitting: *Deleted

## 2016-06-24 DIAGNOSIS — Z5321 Procedure and treatment not carried out due to patient leaving prior to being seen by health care provider: Secondary | ICD-10-CM | POA: Insufficient documentation

## 2016-06-24 DIAGNOSIS — L02415 Cutaneous abscess of right lower limb: Secondary | ICD-10-CM | POA: Diagnosis present

## 2016-06-24 NOTE — ED Notes (Signed)
Pt c/o abscess to rt hip x 4 days; pt states that it is painful and she is unable to lay on that side

## 2016-06-30 ENCOUNTER — Emergency Department (HOSPITAL_COMMUNITY)
Admission: EM | Admit: 2016-06-30 | Discharge: 2016-06-30 | Disposition: A | Discharge status: Home / Self Care | Payer: Medicaid Other | Attending: Emergency Medicine | Admitting: Emergency Medicine

## 2016-06-30 ENCOUNTER — Encounter (HOSPITAL_COMMUNITY): Payer: Self-pay

## 2016-06-30 DIAGNOSIS — Y999 Unspecified external cause status: Secondary | ICD-10-CM | POA: Diagnosis not present

## 2016-06-30 DIAGNOSIS — Y939 Activity, unspecified: Secondary | ICD-10-CM | POA: Insufficient documentation

## 2016-06-30 DIAGNOSIS — R109 Unspecified abdominal pain: Secondary | ICD-10-CM | POA: Insufficient documentation

## 2016-06-30 DIAGNOSIS — R112 Nausea with vomiting, unspecified: Secondary | ICD-10-CM | POA: Diagnosis not present

## 2016-06-30 DIAGNOSIS — T63301A Toxic effect of unspecified spider venom, accidental (unintentional), initial encounter: Secondary | ICD-10-CM | POA: Diagnosis present

## 2016-06-30 DIAGNOSIS — W57XXXA Bitten or stung by nonvenomous insect and other nonvenomous arthropods, initial encounter: Secondary | ICD-10-CM | POA: Insufficient documentation

## 2016-06-30 DIAGNOSIS — Z79899 Other long term (current) drug therapy: Secondary | ICD-10-CM | POA: Insufficient documentation

## 2016-06-30 DIAGNOSIS — Y929 Unspecified place or not applicable: Secondary | ICD-10-CM | POA: Insufficient documentation

## 2016-06-30 DIAGNOSIS — J45909 Unspecified asthma, uncomplicated: Secondary | ICD-10-CM | POA: Diagnosis not present

## 2016-06-30 LAB — URINE MICROSCOPIC-ADD ON

## 2016-06-30 LAB — URINALYSIS, ROUTINE W REFLEX MICROSCOPIC
BILIRUBIN URINE: NEGATIVE
GLUCOSE, UA: NEGATIVE mg/dL
Hgb urine dipstick: NEGATIVE
KETONES UR: NEGATIVE mg/dL
NITRITE: NEGATIVE
PROTEIN: NEGATIVE mg/dL
Specific Gravity, Urine: 1.025 (ref 1.005–1.030)
pH: 7 (ref 5.0–8.0)

## 2016-06-30 LAB — POC URINE PREG, ED: PREG TEST UR: NEGATIVE

## 2016-06-30 MED ORDER — KETOROLAC TROMETHAMINE 30 MG/ML IJ SOLN
30.0000 mg | Freq: Once | INTRAMUSCULAR | Status: AC
  Administered 2016-06-30: 30 mg via INTRAMUSCULAR
  Filled 2016-06-30: qty 1

## 2016-06-30 NOTE — ED Provider Notes (Signed)
[facility] DEPT Provider Note   CSN: [id number] Arrival date & time: 06/30/16  1655  First Provider Contact:  First MD Initiated Contact with Patient 06/30/16 1857        History   Chief Complaint Chief Complaint  Patient presents with  . Insect Bite  . Flank Pain    HPI Jillian Bishop is a 20 y.o. female.  Patient is a 20 year old female with no pertinent past medical history presents the ED with complaint of abdominal pain, onset one week. Patient reports having intermittent waxing and waning "cramping" pain to the sides of her abdomen. Denies any aggravating or alleviating factors. Patient also reports having nausea and NBNB vomiting earlier in the week. Denies taking any medications at home. Denies fever, chills, cough, shortness of breath, chest pain, hematemesis, diarrhea, urinary symptoms, hematuria, flank pain, vaginal bleeding, vaginal discharge. Endorses history of appendectomy. LMP 05/17/16. Pt reports she was scheduled to have her period 2 weeks ago but denies having any menstrual cycle since June. Patient also reports having a "spider bite" to her right lateral thigh which she noticed a week ago. She reports the wound began draining purulent drainage at home and has reduced in size and swelling over the past few days. Denies fever or surrounding swelling, redness or warmth. Patient states she has been cleaning the wound at home with soap and water.    Flank Pain  Associated symptoms include abdominal pain, nausea and vomiting.    Past Medical History:  Diagnosis Date  . Asthma   . HPV (human papilloma virus) infection     Patient Active Problem List   Diagnosis Date Noted  . History of prior pregnancy with SGA newborn 12/09/2015    Past Surgical History:  Procedure Laterality Date  . APPENDECTOMY      OB History    Gravida Para Term Preterm AB Living   2 2 2     2    SAB TAB Ectopic Multiple Live Births         0         Home Medications    Prior  to Admission medications   Medication Sig Start Date End Date Taking? Authorizing Provider  albuterol (PROVENTIL HFA;VENTOLIN HFA) 108 (90 BASE) MCG/ACT inhaler Inhale 2 puffs into the lungs every 6 (six) hours as needed for wheezing or shortness of breath. Reported on 03/12/2016   Yes Historical Provider, MD  Norethin Ace-Eth Estrad-FE (MINASTRIN 24 FE) 1-20 MG-MCG(24) CHEW Chew 1 tablet by mouth daily.   Yes Historical Provider, MD  ibuprofen (ADVIL,MOTRIN) 600 MG tablet Take 1 tablet (600 mg total) by mouth every 6 (six) hours. Patient not taking: Reported on 03/12/2016 01/21/16   Gwynne Edinger, MD  Norethin Ace-Eth Estrad-FE 1-20 MG-MCG(24) CHEW Chew 1 tablet by mouth daily. Patient not taking: Reported on 06/30/2016 03/12/16   Cecille Rubin A Clemmons, CNM  senna-docusate (SENOKOT-S) 8.6-50 MG tablet Take 2 tablets by mouth at bedtime as needed for mild constipation. Patient not taking: Reported on 03/12/2016 01/21/16   Gwynne Edinger, MD    Family History Family History  Problem Relation Age of Onset  . Hypertension Mother   . Hypertension Father   . Diabetes Father   . Hypertension Sister   . Heart disease Paternal Grandmother   . Hypertension Paternal Grandmother     Social History Social History  Substance Use Topics  . Smoking status: Never Smoker  . Smokeless tobacco: Never Used  . Alcohol use No  Allergies   Review of patient's allergies indicates no known allergies.   Review of Systems Review of Systems  Gastrointestinal: Positive for abdominal pain, nausea and vomiting.  Skin: Positive for wound.  All other systems reviewed and are negative.    Physical Exam Updated Vital Signs BP 124/84 (BP Location: Right Arm)   Pulse 68   Temp 98.8 F (37.1 C) (Oral)   Resp 18   LMP 05/17/2016   SpO2 100%   Physical Exam  Constitutional: She is oriented to person, place, and time. She appears well-developed and well-nourished. No distress.  HENT:  Head: Normocephalic  and atraumatic.  Mouth/Throat: Oropharynx is clear and moist. No oropharyngeal exudate.  Eyes: Conjunctivae and EOM are normal. Right eye exhibits no discharge. Left eye exhibits no discharge. No scleral icterus.  Neck: Normal range of motion. Neck supple.  Cardiovascular: Normal rate, regular rhythm, normal heart sounds and intact distal pulses.   Pulmonary/Chest: Effort normal and breath sounds normal. No respiratory distress. She has no wheezes. She has no rales. She exhibits no tenderness.  Abdominal: Soft. Bowel sounds are normal. She exhibits no distension and no mass. There is tenderness (mild tenderness to lateral aspect of abdomen, R>L). There is no rebound and no guarding. No hernia.  No CVA tenderness  Musculoskeletal: Normal range of motion. She exhibits no edema.  Neurological: She is alert and oriented to person, place, and time.  Skin: Skin is warm and dry. She is not diaphoretic.  Open 1 cm healing wound to right lateral proximal thigh; no surrounding swelling, erythema, warmth, induration or fluctuance. No drainage. Nontender.  Nursing note and vitals reviewed.    ED Treatments / Results  Labs (all labs ordered are listed, but only abnormal results are displayed) Labs Reviewed  URINALYSIS, ROUTINE W REFLEX MICROSCOPIC (NOT AT [facility]) - Abnormal; Notable for the following:       Result Value   Leukocytes, UA SMALL (*)    All other components within normal limits  URINE MICROSCOPIC-ADD ON - Abnormal; Notable for the following:    Squamous Epithelial / LPF 6-30 (*)    Bacteria, UA RARE (*)    All other components within normal limits  POC URINE PREG, ED    EKG  EKG Interpretation None       Radiology No results found.  Procedures Procedures (including critical care time)  Medications Ordered in ED Medications  ketorolac (TORADOL) 30 MG/ML injection 30 mg (30 mg Intramuscular Given 06/30/16 1956)     Initial Impression / Assessment and Plan / ED Course  I  have reviewed the triage vital signs and the nursing notes.  Pertinent labs & imaging results that were available during my care of the patient were reviewed by me and considered in my medical decision making (see chart for details).  Clinical Course    Patient presents with abdominal pain with associated nausea and vomiting. She also reports having a spider bite to her right thigh which has been draining at home. Denies fever. VSS. Exam revealed mild tenderness to lateral aspects of abdomen, right worse than left, no peritoneal signs, no CVA tenderness. Small healing wound noted to right lateral thigh without drainage, no signs of cellulitis. Patient reports wound has significantly decreased in size. Patient given Toradol in the ED. Pregnancy negative. UA results sent with contamination, I do not suspect urinary tract infection and do not feel that the patient warrants antibiotics at this time. I do not suspect an acute surgical abdomen  at this time and do not feel any further workup or imaging is warranted at this time. On reevaluation patient reports her pain has improved. Plan discharge patient home with symptomatic treatment and PCP follow-up. Discussed return precautions with patient.  Final Clinical Impressions(s) / ED Diagnoses   Final diagnoses:  Abdominal pain, unspecified abdominal location    New Prescriptions Discharge Medication List as of 06/30/2016  8:41 PM       Nona Dell, PA-C 07/01/16 [place], MD 07/01/16 [phone]

## 2016-06-30 NOTE — ED Triage Notes (Signed)
Pt here for insect bite to rt thigh.  Left flank pain and late on menstrual period.   Bite- started last week.  Open and draining.  Flank pain - started Sunday.  No change in urination.  Some vaginal discharge.  Pregnancy - ? 2 weeks late.

## 2016-06-30 NOTE — Discharge Instructions (Signed)
I recommend taking 600 mg ibuprofen every 6 hours as needed for pain relief. Please follow up with a primary care provider from the Resource Guide provided below in one week if your symptoms have not improved. Please return to the Emergency Department if symptoms worsen or new onset of fever, new/worsening abdominal pain, vomiting, flank pain, blood in urine, vaginal bleeding, vaginal discharge.

## 2016-07-10 ENCOUNTER — Ambulatory Visit: Payer: Medicaid Other | Admitting: Obstetrics & Gynecology

## 2016-07-22 ENCOUNTER — Encounter: Payer: Self-pay | Admitting: *Deleted

## 2016-08-03 ENCOUNTER — Emergency Department (HOSPITAL_BASED_OUTPATIENT_CLINIC_OR_DEPARTMENT_OTHER)
Admission: EM | Admit: 2016-08-03 | Discharge: 2016-08-03 | Disposition: A | Discharge status: Home / Self Care | Payer: Medicaid Other | Attending: Emergency Medicine | Admitting: Emergency Medicine

## 2016-08-03 ENCOUNTER — Encounter (HOSPITAL_BASED_OUTPATIENT_CLINIC_OR_DEPARTMENT_OTHER): Payer: Self-pay | Admitting: *Deleted

## 2016-08-03 DIAGNOSIS — Y999 Unspecified external cause status: Secondary | ICD-10-CM | POA: Diagnosis not present

## 2016-08-03 DIAGNOSIS — L03116 Cellulitis of left lower limb: Secondary | ICD-10-CM | POA: Insufficient documentation

## 2016-08-03 DIAGNOSIS — W57XXXA Bitten or stung by nonvenomous insect and other nonvenomous arthropods, initial encounter: Secondary | ICD-10-CM | POA: Diagnosis not present

## 2016-08-03 DIAGNOSIS — J45909 Unspecified asthma, uncomplicated: Secondary | ICD-10-CM | POA: Diagnosis not present

## 2016-08-03 DIAGNOSIS — S80862A Insect bite (nonvenomous), left lower leg, initial encounter: Secondary | ICD-10-CM | POA: Diagnosis present

## 2016-08-03 DIAGNOSIS — Z7951 Long term (current) use of inhaled steroids: Secondary | ICD-10-CM | POA: Diagnosis not present

## 2016-08-03 DIAGNOSIS — Z791 Long term (current) use of non-steroidal anti-inflammatories (NSAID): Secondary | ICD-10-CM | POA: Diagnosis not present

## 2016-08-03 DIAGNOSIS — L03119 Cellulitis of unspecified part of limb: Secondary | ICD-10-CM

## 2016-08-03 DIAGNOSIS — Y939 Activity, unspecified: Secondary | ICD-10-CM | POA: Insufficient documentation

## 2016-08-03 DIAGNOSIS — Y929 Unspecified place or not applicable: Secondary | ICD-10-CM | POA: Diagnosis not present

## 2016-08-03 MED ORDER — SULFAMETHOXAZOLE-TRIMETHOPRIM 800-160 MG PO TABS: 1.0000 | ORAL_TABLET | Freq: Two times a day (BID) | ORAL | 0 refills | Status: AC

## 2016-08-03 NOTE — ED Notes (Signed)
Pt was seen at Urgent Care and advised to come to ED to be seen. Pt reports bug bite x 2 weeks with no improvement.

## 2016-08-03 NOTE — ED Notes (Signed)
PA at bedside.

## 2016-08-03 NOTE — Discharge Instructions (Signed)
Take the antibiotic as prescribed and be sure to complete the entire 7 day course. Stopped this antibiotic if you experience rash and return immediately to the emergency department. Return to this emergency department or any [facility] department or urgent care in 2 days to have your wound rechecked.  Return to emergency department sooner if you experience fever, increased redness, swelling, warmth, red streaks, increased pain to the area, rash, or any other concerning symptoms.

## 2016-08-03 NOTE — ED Triage Notes (Signed)
States she has an insect bite to her left lower leg. Not getting better with time.

## 2016-08-03 NOTE — ED Provider Notes (Signed)
[facility] Provider Note   CSN: [id number] Arrival date & time: 08/03/16  1905  By signing my name below, I, Jillian Bishop, attest that this documentation has been prepared under the direction and in the presence of [name], PA-C. Electronically Signed: Irene Bishop, ED Scribe. 08/03/16. 8:58 PM.  History   Chief Complaint Chief Complaint  Patient presents with  . Insect Bite   The history is provided by the patient. No language interpreter was used.  HPI Comments: Jillian Bishop is a 20 y.o. female who presents to the Emergency Department complaining of a gradually worsening, non-itching insect bite to the medial left lower leg onset two weeks ago. Pt reports that there has been white-blood tinged drainage from the area that began this week. Pt states that a lot of purulent drainage came from the area yesterday.There is associated crusting, pain, warmth, and redness that is spreading from a central blackened area. Pt had another insect bite to the right upper thigh that has since resolved. She states that both bites looked the same in appearance when they first appeared, raised and red in appearance. However, the bite on her upper thigh did not turn black. Pt was seen by urgent care earlier today and was directed to the ED for the infection. Pt has been keeping the area covered. She denies fever, chills, trouble swallowing, SOB, nausea, vomiting, numbness, weakness, or calf swelling. Pt denies allergies to medications.   Past Medical History:  Diagnosis Date  . Asthma   . HPV (human papilloma virus) infection     Patient Active Problem List   Diagnosis Date Noted  . History of prior pregnancy with SGA newborn 12/09/2015    Past Surgical History:  Procedure Laterality Date  . APPENDECTOMY      OB History    Gravida Para Term Preterm AB Living   2 2 2     2    SAB TAB Ectopic Multiple Live Births         0 2      Home Medications    Prior to Admission  medications   Medication Sig Start Date End Date Taking? Authorizing Provider  albuterol (PROVENTIL HFA;VENTOLIN HFA) 108 (90 BASE) MCG/ACT inhaler Inhale 2 puffs into the lungs every 6 (six) hours as needed for wheezing or shortness of breath. Reported on 03/12/2016   Yes Historical Provider, MD  ibuprofen (ADVIL,MOTRIN) 600 MG tablet Take 1 tablet (600 mg total) by mouth every 6 (six) hours. 01/21/16  Yes Gwynne Edinger, MD  Norethin Ace-Eth Estrad-FE (MINASTRIN 24 FE) 1-20 MG-MCG(24) CHEW Chew 1 tablet by mouth daily.   Yes Historical Provider, MD  senna-docusate (SENOKOT-S) 8.6-50 MG tablet Take 2 tablets by mouth at bedtime as needed for mild constipation. 01/21/16  Yes Gwynne Edinger, MD  Norethin Ace-Eth Estrad-FE 1-20 MG-MCG(24) CHEW Chew 1 tablet by mouth daily. Patient not taking: Reported on 06/30/2016 03/12/16   Cecille Rubin A Clemmons, CNM  sulfamethoxazole-trimethoprim (BACTRIM DS,SEPTRA DS) 800-160 MG tablet Take 1 tablet by mouth 2 (two) times daily. 08/03/16 08/10/16  Kalman Drape, PA    Family History Family History  Problem Relation Age of Onset  . Hypertension Mother   . Hypertension Father   . Diabetes Father   . Hypertension Sister   . Heart disease Paternal Grandmother   . Hypertension Paternal Grandmother     Social History Social History  Substance Use Topics  . Smoking status: Never Smoker  . Smokeless tobacco: Never  Used  . Alcohol use No     Allergies   Review of patient's allergies indicates no known allergies.   Review of Systems Review of Systems  Constitutional: Negative for chills and fever.  HENT: Negative for trouble swallowing.   Respiratory: Negative for shortness of breath.   Cardiovascular: Negative for leg swelling.  Gastrointestinal: Negative for nausea and vomiting.  Musculoskeletal: Positive for arthralgias.  Skin: Positive for rash.  Neurological: Negative for weakness and numbness.   Physical Exam Updated Vital Signs BP 117/70    Pulse 70   Temp 98.1 F (36.7 C) (Oral)   Resp 16   Ht 5\' 3"  (1.6 m)   Wt 170 lb (77.1 kg)   LMP 07/18/2016   SpO2 100%   BMI 30.11 kg/m   Physical Exam  Constitutional: She appears well-developed and well-nourished. No distress.  HENT:  Head: Normocephalic and atraumatic.  Eyes: Conjunctivae are normal.  Pulmonary/Chest: Effort normal. No respiratory distress.  Musculoskeletal: Normal range of motion.  Neurological: She is alert. Coordination normal.  Skin: Skin is warm and dry. She is not diaphoretic.  Lesion noted to left lower leg; surrounding erythema and increased warmth; area of induration roughly 3 cm in diameter; no fluctuance noted; central crater with surrounding eschar; see photo below, patient neurovascularly intact distally.  Psychiatric: She has a normal mood and affect. Her behavior is normal.  Nursing note and vitals reviewed.     ED Treatments / Results  DIAGNOSTIC STUDIES: Oxygen Saturation is 100% on RA, normal by my interpretation.    COORDINATION OF CARE: 8:51 PM-Discussed treatment plan which includes anti-biotics and follow up with provider in two days with pt at bedside and pt agreed to plan.   Labs (all labs ordered are listed, but only abnormal results are displayed) Labs Reviewed - No data to display  EKG  EKG Interpretation None       Radiology No results found.  Procedures Procedures (including critical care time)  Medications Ordered in ED Medications - No data to display   Initial Impression / Assessment and Plan / ED Course  I have reviewed the triage vital signs and the nursing notes.  Pertinent labs & imaging results that were available during my care of the patient were reviewed by me and considered in my medical decision making (see chart for details).  Clinical Course   Patient presents with cellulitis to left lower leg. Patient states she had purulent drainage from her wound yesterday. Likely was an abscess that  drained. Area of induration, increased warmth and redness suggesting cellulitis. Will discharge patient on Bactrim. Instructed patient to follow-up either here or at the urgent care in 2 days to have wound rechecked. Discussed strict return precautions to include rash. Patient expressed understanding to the discharge instructions.  Patient case discussed with Dr. Tamera Punt who agrees with the above plan.  I personally performed the services described in this documentation, which was scribed in my presence. The recorded information has been reviewed and is accurate.     Final Clinical Impressions(s) / ED Diagnoses   Final diagnoses:  Cellulitis of lower extremity, unspecified laterality    New Prescriptions Discharge Medication List as of 08/03/2016  9:15 PM    START taking these medications   Details  sulfamethoxazole-trimethoprim (BACTRIM DS,SEPTRA DS) 800-160 MG tablet Take 1 tablet by mouth 2 (two) times daily., Starting Mon 08/03/2016, Until Mon 08/10/2016, [place], [place] 08/03/16 2130  Malvin Johns, MD 08/03/16 [phone]

## 2016-08-19 ENCOUNTER — Ambulatory Visit (INDEPENDENT_AMBULATORY_CARE_PROVIDER_SITE_OTHER): Payer: Medicaid Other

## 2016-08-19 DIAGNOSIS — Z3202 Encounter for pregnancy test, result negative: Secondary | ICD-10-CM

## 2016-08-19 DIAGNOSIS — Z3200 Encounter for pregnancy test, result unknown: Secondary | ICD-10-CM

## 2016-08-19 LAB — POCT PREGNANCY, URINE: Preg Test, Ur: NEGATIVE

## 2016-08-20 NOTE — Progress Notes (Signed)
Patient presented to office on today for possible pregnancy test. Test confirm at this time she is not pregnant. Patient stated she had a positive pregnancy test at home this am. I advised patient that the test here was negative at this time. I advised patient to wait for a couple days and take another UPT to see what the reading would be. Patient verbalizes understanding at this time.

## 2016-08-23 ENCOUNTER — Inpatient Hospital Stay (HOSPITAL_COMMUNITY): Payer: Medicaid Other

## 2016-08-23 ENCOUNTER — Inpatient Hospital Stay (HOSPITAL_COMMUNITY)
Admission: AD | Admit: 2016-08-23 | Discharge: 2016-08-23 | Disposition: A | Discharge status: Home / Self Care | Payer: Medicaid Other | Source: Ambulatory Visit | Attending: Obstetrics and Gynecology | Admitting: Obstetrics and Gynecology

## 2016-08-23 ENCOUNTER — Encounter (HOSPITAL_COMMUNITY): Payer: Self-pay

## 2016-08-23 DIAGNOSIS — Z3A01 Less than 8 weeks gestation of pregnancy: Secondary | ICD-10-CM | POA: Diagnosis not present

## 2016-08-23 DIAGNOSIS — R102 Pelvic and perineal pain: Secondary | ICD-10-CM | POA: Insufficient documentation

## 2016-08-23 DIAGNOSIS — O9989 Other specified diseases and conditions complicating pregnancy, childbirth and the puerperium: Secondary | ICD-10-CM | POA: Diagnosis not present

## 2016-08-23 DIAGNOSIS — R109 Unspecified abdominal pain: Secondary | ICD-10-CM

## 2016-08-23 DIAGNOSIS — O3481 Maternal care for other abnormalities of pelvic organs, first trimester: Secondary | ICD-10-CM | POA: Insufficient documentation

## 2016-08-23 DIAGNOSIS — O26891 Other specified pregnancy related conditions, first trimester: Secondary | ICD-10-CM | POA: Diagnosis not present

## 2016-08-23 DIAGNOSIS — O26899 Other specified pregnancy related conditions, unspecified trimester: Secondary | ICD-10-CM

## 2016-08-23 DIAGNOSIS — N8311 Corpus luteum cyst of right ovary: Secondary | ICD-10-CM | POA: Insufficient documentation

## 2016-08-23 LAB — WET PREP, GENITAL
CLUE CELLS WET PREP: NONE SEEN
TRICH WET PREP: NONE SEEN
YEAST WET PREP: NONE SEEN

## 2016-08-23 LAB — POCT PREGNANCY, URINE: Preg Test, Ur: POSITIVE — AB

## 2016-08-23 LAB — CBC
HCT: 32.8 % — ABNORMAL LOW (ref 36.0–46.0)
Hemoglobin: 10.6 g/dL — ABNORMAL LOW (ref 12.0–15.0)
MCH: 24.5 pg — ABNORMAL LOW (ref 26.0–34.0)
MCHC: 32.3 g/dL (ref 30.0–36.0)
MCV: 75.8 fL — ABNORMAL LOW (ref 78.0–100.0)
Platelets: 357 10*3/uL (ref 150–400)
RBC: 4.33 MIL/uL (ref 3.87–5.11)
RDW: 18.1 % — ABNORMAL HIGH (ref 11.5–15.5)
WBC: 9.9 10*3/uL (ref 4.0–10.5)

## 2016-08-23 LAB — URINALYSIS, ROUTINE W REFLEX MICROSCOPIC
Bilirubin Urine: NEGATIVE
GLUCOSE, UA: NEGATIVE mg/dL
Hgb urine dipstick: NEGATIVE
KETONES UR: NEGATIVE mg/dL
LEUKOCYTES UA: NEGATIVE
Nitrite: NEGATIVE
PH: 7 (ref 5.0–8.0)
Protein, ur: NEGATIVE mg/dL
SPECIFIC GRAVITY, URINE: 1.015 (ref 1.005–1.030)

## 2016-08-23 LAB — HCG, QUANTITATIVE, PREGNANCY: hCG, Beta Chain, Quant, S: 99 m[IU]/mL — ABNORMAL HIGH (ref <5)

## 2016-08-23 LAB — RAPID HIV SCREEN (HIV 1/2 AB+AG)
HIV 1/2 Antibodies: NONREACTIVE
HIV-1 P24 Antigen - HIV24: NONREACTIVE

## 2016-08-23 NOTE — MAU Note (Signed)
Had a positive home test, 5 days late for a period, needs confirmation of pregnancy, LMP 07/18/16. Right side pain for a few days. Nausea no vomiting.

## 2016-08-23 NOTE — Progress Notes (Signed)
Patient had faint positive UPT in MAU 

## 2016-08-23 NOTE — MAU Provider Note (Signed)
History     CSN: [id number]  Arrival date and time: 08/23/16 1310   First Provider Initiated Contact with Patient 08/23/16 1336      Chief Complaint  Patient presents with  . Possible Pregnancy   HPI Jillian Bishop if a 20 y.o. G3P2002 at [redacted]w[redacted]d. LMP 8/12, she has had one neg and one positive UPT at home. She is having right side pain off/on x2-3d, sharp, lasts a few minutes then resolves. She denies any vaginal bleeding, or changes in discharge, odor or itching. She has urinary frequency, no urgency or dysuria. She is nauseated, no vomiting. She conceived on OCP's, stopped taking them with + UPT 5 d ago.  Has an appt Oct 2 with [facility].   OB History    Gravida Para Term Preterm AB Living   3 2 2     2    SAB TAB Ectopic Multiple Live Births         0 2      Past Medical History:  Diagnosis Date  . Asthma   . HPV (human papilloma virus) infection     Past Surgical History:  Procedure Laterality Date  . APPENDECTOMY      Family History  Problem Relation Age of Onset  . Hypertension Mother   . Hypertension Father   . Diabetes Father   . Hypertension Sister   . Heart disease Paternal Grandmother   . Hypertension Paternal Grandmother     Social History  Substance Use Topics  . Smoking status: Never Smoker  . Smokeless tobacco: Never Used  . Alcohol use No    Allergies: No Known Allergies  Prescriptions Prior to Admission  Medication Sig Dispense Refill Last Dose  . albuterol (PROVENTIL HFA;VENTOLIN HFA) 108 (90 BASE) MCG/ACT inhaler Inhale 2 puffs into the lungs every 6 (six) hours as needed for wheezing or shortness of breath. Reported on 03/12/2016   Not Taking  . ibuprofen (ADVIL,MOTRIN) 600 MG tablet Take 1 tablet (600 mg total) by mouth every 6 (six) hours. (Patient not taking: Reported on 08/20/2016) 90 tablet 3 Not Taking  . Norethin Ace-Eth Estrad-FE (MINASTRIN 24 FE) 1-20 MG-MCG(24) CHEW Chew 1 tablet by mouth daily.   Not Taking  . Norethin Ace-Eth Estrad-FE  1-20 MG-MCG(24) CHEW Chew 1 tablet by mouth daily. (Patient not taking: Reported on 08/20/2016) 28 tablet 11 Not Taking  . senna-docusate (SENOKOT-S) 8.6-50 MG tablet Take 2 tablets by mouth at bedtime as needed for mild constipation. (Patient not taking: Reported on 08/20/2016) 30 tablet 1 Not Taking    Review of Systems  Gastrointestinal: Positive for abdominal pain and nausea. Negative for vomiting.  Genitourinary: Positive for frequency. Negative for dysuria and urgency.       No bleeding or spotting   Physical Exam   Blood pressure 130/70, temperature 98.5 F (36.9 C), temperature source Oral, resp. rate 16, last menstrual period 07/18/2016, not currently breastfeeding.  Physical Exam  Nursing note and vitals reviewed. Constitutional: She is oriented to person, place, and time. She appears well-developed and well-nourished.  GI: Soft. There is no tenderness.  Genitourinary:  Genitourinary Comments: Pelvic exam: Ext gen- Nl anatomy, skin intact Vagina- small amt white mucoid discharge Cx- purplish, parous Uterus- nl size, non tender And-non tender, no masses palp  Musculoskeletal: Normal range of motion.  Neurological: She is alert and oriented to person, place, and time.  Skin: Skin is warm and dry.  Psychiatric: She has a normal mood and affect. Her behavior is  normal.    MAU Course  Procedures  MDM Results for orders placed or performed during the hospital encounter of 08/23/16 (from the past 24 hour(s))  Urinalysis, Routine w reflex microscopic (not at [facility])     Status: None   Collection Time: 08/23/16  1:25 PM  Result Value Ref Range   Color, Urine YELLOW YELLOW   APPearance CLEAR CLEAR   Specific Gravity, Urine 1.015 1.005 - 1.030   pH 7.0 5.0 - 8.0   Glucose, UA NEGATIVE NEGATIVE mg/dL   Hgb urine dipstick NEGATIVE NEGATIVE   Bilirubin Urine NEGATIVE NEGATIVE   Ketones, ur NEGATIVE NEGATIVE mg/dL   Protein, ur NEGATIVE NEGATIVE mg/dL   Nitrite NEGATIVE  NEGATIVE   Leukocytes, UA NEGATIVE NEGATIVE  Pregnancy, urine POC     Status: Abnormal   Collection Time: 08/23/16  1:29 PM  Result Value Ref Range   Preg Test, Ur POSITIVE (A) NEGATIVE  CBC     Status: Abnormal   Collection Time: 08/23/16  1:41 PM  Result Value Ref Range   WBC 9.9 4.0 - 10.5 K/uL   RBC 4.33 3.87 - 5.11 MIL/uL   Hemoglobin 10.6 (L) 12.0 - 15.0 g/dL   HCT 32.8 (L) 36.0 - 46.0 %   MCV 75.8 (L) 78.0 - 100.0 fL   MCH 24.5 (L) 26.0 - 34.0 pg   MCHC 32.3 30.0 - 36.0 g/dL   RDW 18.1 (H) 11.5 - 15.5 %   Platelets 357 150 - 400 K/uL  hCG, quantitative, pregnancy     Status: Abnormal   Collection Time: 08/23/16  1:41 PM  Result Value Ref Range   hCG, Beta Chain, Quant, S 99 (H) <5 mIU/mL  Rapid HIV screen (HIV 1/2 Ab+Ag) ([facility] Only)     Status: None   Collection Time: 08/23/16  1:45 PM  Result Value Ref Range   HIV-1 P24 Antigen - HIV24 NON REACTIVE NON REACTIVE   HIV 1/2 Antibodies NON REACTIVE NON REACTIVE   Interpretation (HIV Ag Ab)      A non reactive test result means that HIV 1 or HIV 2 antibodies and HIV 1 p24 antigen were not detected in the specimen.  Wet prep, genital     Status: Abnormal   Collection Time: 08/23/16  1:50 PM  Result Value Ref Range   Yeast Wet Prep HPF POC NONE SEEN NONE SEEN   Trich, Wet Prep NONE SEEN NONE SEEN   Clue Cells Wet Prep HPF POC NONE SEEN NONE SEEN   WBC, Wet Prep HPF POC MANY (A) NONE SEEN   Sperm PRESENT    U/S- no IUGS or adnexal masses. Has right CLC and 2 cm dermoid.  Assessment and Plan  BHCG 99, no IUGS or adnexal masses Ectopic precations reviewed F/U 48 hr in [facility] outpatient clinic  [place], Mckinleigh Schuchart M. 08/23/2016, 1:42 PM

## 2016-08-23 NOTE — Discharge Instructions (Signed)
Return with increased low pelvic pain, dizziness/fainting. Follow up with clinic downstairs on Tuesday 9/19 at 11:00 am for repeaat blood work.

## 2016-08-24 LAB — GC/CHLAMYDIA PROBE AMP (~~LOC~~) NOT AT ARMC
Chlamydia: NEGATIVE
Neisseria Gonorrhea: NEGATIVE

## 2016-08-25 ENCOUNTER — Other Ambulatory Visit: Payer: Self-pay

## 2016-08-25 DIAGNOSIS — O3680X0 Pregnancy with inconclusive fetal viability, not applicable or unspecified: Secondary | ICD-10-CM

## 2016-08-25 LAB — HCG, QUANTITATIVE, PREGNANCY: HCG, BETA CHAIN, QUANT, S: 375 m[IU]/mL — AB (ref <5)

## 2016-08-25 NOTE — Progress Notes (Unsigned)
Patient presented to office today for stat beta. Patient currently has no bleeding or pain at this time. Patient was instructed to wait for her results. This will ensure patient receives and understanding results. Patient verbalizes understanding at this time.

## 2016-09-07 ENCOUNTER — Ambulatory Visit: Payer: Medicaid Other | Admitting: Certified Nurse Midwife

## 2016-09-07 ENCOUNTER — Ambulatory Visit: Payer: Medicaid Other | Admitting: Family Medicine

## 2016-09-08 ENCOUNTER — Ambulatory Visit: Payer: Medicaid Other | Admitting: *Deleted

## 2016-09-08 ENCOUNTER — Encounter: Payer: Self-pay | Admitting: *Deleted

## 2016-09-08 ENCOUNTER — Ambulatory Visit (HOSPITAL_COMMUNITY)
Admission: RE | Admit: 2016-09-08 | Discharge: 2016-09-08 | Disposition: A | Discharge status: Home / Self Care | Payer: Medicaid Other | Source: Ambulatory Visit | Attending: Obstetrics & Gynecology | Admitting: Obstetrics & Gynecology

## 2016-09-08 DIAGNOSIS — Z362 Encounter for other antenatal screening follow-up: Secondary | ICD-10-CM | POA: Insufficient documentation

## 2016-09-08 DIAGNOSIS — Z3A01 Less than 8 weeks gestation of pregnancy: Secondary | ICD-10-CM | POA: Insufficient documentation

## 2016-09-08 DIAGNOSIS — O3680X0 Pregnancy with inconclusive fetal viability, not applicable or unspecified: Secondary | ICD-10-CM

## 2016-09-08 DIAGNOSIS — Z3401 Encounter for supervision of normal first pregnancy, first trimester: Secondary | ICD-10-CM

## 2016-09-08 NOTE — Progress Notes (Signed)
Pt in for ultrasound results, reviewed ultrasound with Dr. Roselie Awkward who said that ultrasound looks good and patient should establish prenatal care. Pt given pregnancy verification letter.

## 2016-09-09 DIAGNOSIS — Z3490 Encounter for supervision of normal pregnancy, unspecified, unspecified trimester: Secondary | ICD-10-CM | POA: Insufficient documentation

## 2016-09-30 LAB — OB RESULTS CONSOLE RUBELLA ANTIBODY, IGM: Rubella: IMMUNE

## 2016-09-30 LAB — OB RESULTS CONSOLE VARICELLA ZOSTER ANTIBODY, IGG: Varicella: IMMUNE

## 2016-09-30 LAB — OB RESULTS CONSOLE RPR: RPR: NONREACTIVE

## 2016-09-30 LAB — OB RESULTS CONSOLE HEPATITIS B SURFACE ANTIGEN: HEP B S AG: NEGATIVE

## 2016-12-07 NOTE — L&D Delivery Note (Signed)
Delivery Note Pt rapidly progressed to complete dilation and pushed well for about 69min.  At 10:14 AM a healthy female was delivered via Vaginal, Spontaneous Delivery (Presentation: ;  ).  APGAR: 9, 9; weight pending .   Placenta status: delivered spontaneously .  Cord:  with the following complications: short .    Anesthesia:  None Episiotomy: None Lacerations: Abrasions, hemostatic Suture Repair: n/a Est. Blood Loss (mL): 131mL  Mom to postpartum.  Baby to Couplet care / Skin to Skin.  Logan Bores 04/23/2017, 10:29 AM

## 2017-01-07 ENCOUNTER — Inpatient Hospital Stay (HOSPITAL_COMMUNITY)
Admission: AD | Admit: 2017-01-07 | Discharge: 2017-01-07 | Disposition: A | Discharge status: Home / Self Care | Payer: Medicaid Other | Source: Ambulatory Visit | Attending: Obstetrics and Gynecology | Admitting: Obstetrics and Gynecology

## 2017-01-07 ENCOUNTER — Encounter (HOSPITAL_COMMUNITY): Payer: Self-pay | Admitting: Advanced Practice Midwife

## 2017-01-07 DIAGNOSIS — R0789 Other chest pain: Secondary | ICD-10-CM | POA: Insufficient documentation

## 2017-01-07 DIAGNOSIS — O99512 Diseases of the respiratory system complicating pregnancy, second trimester: Secondary | ICD-10-CM | POA: Insufficient documentation

## 2017-01-07 DIAGNOSIS — O26892 Other specified pregnancy related conditions, second trimester: Secondary | ICD-10-CM | POA: Insufficient documentation

## 2017-01-07 DIAGNOSIS — K209 Esophagitis, unspecified without bleeding: Secondary | ICD-10-CM

## 2017-01-07 DIAGNOSIS — A630 Anogenital (venereal) warts: Secondary | ICD-10-CM | POA: Diagnosis not present

## 2017-01-07 DIAGNOSIS — R102 Pelvic and perineal pain: Secondary | ICD-10-CM | POA: Insufficient documentation

## 2017-01-07 DIAGNOSIS — O99612 Diseases of the digestive system complicating pregnancy, second trimester: Secondary | ICD-10-CM | POA: Diagnosis not present

## 2017-01-07 DIAGNOSIS — J45909 Unspecified asthma, uncomplicated: Secondary | ICD-10-CM | POA: Diagnosis not present

## 2017-01-07 DIAGNOSIS — R198 Other specified symptoms and signs involving the digestive system and abdomen: Secondary | ICD-10-CM

## 2017-01-07 DIAGNOSIS — O9989 Other specified diseases and conditions complicating pregnancy, childbirth and the puerperium: Secondary | ICD-10-CM | POA: Insufficient documentation

## 2017-01-07 DIAGNOSIS — Z3A24 24 weeks gestation of pregnancy: Secondary | ICD-10-CM | POA: Diagnosis not present

## 2017-01-07 DIAGNOSIS — R072 Precordial pain: Secondary | ICD-10-CM

## 2017-01-07 DIAGNOSIS — O98312 Other infections with a predominantly sexual mode of transmission complicating pregnancy, second trimester: Secondary | ICD-10-CM | POA: Insufficient documentation

## 2017-01-07 LAB — URINALYSIS, ROUTINE W REFLEX MICROSCOPIC
BILIRUBIN URINE: NEGATIVE
Glucose, UA: NEGATIVE mg/dL
Ketones, ur: NEGATIVE mg/dL
Nitrite: NEGATIVE
PH: 6 (ref 5.0–8.0)
Protein, ur: NEGATIVE mg/dL
SPECIFIC GRAVITY, URINE: 1.027 (ref 1.005–1.030)

## 2017-01-07 MED ORDER — GI COCKTAIL ~~LOC~~
30.0000 mL | Freq: Once | ORAL | Status: AC
  Administered 2017-01-07: 30 mL via ORAL
  Filled 2017-01-07: qty 30

## 2017-01-07 MED ORDER — PANTOPRAZOLE SODIUM 20 MG PO TBEC: 20.0000 mg | DELAYED_RELEASE_TABLET | Freq: Every day | ORAL | 0 refills | Status: DC

## 2017-01-07 NOTE — MAU Note (Signed)
Pt reports she has been feeling a constant  "pressure in her but" x 3 days. Denies vaginal discharge or bleeding. Also c/o feeling pain in the middle of her chest when she takes a deep breath in.

## 2017-01-07 NOTE — Discharge Instructions (Signed)
Esophagitis Introduction Esophagitis is inflammation of the esophagus. The esophagus is the tube that carries food and liquids from your mouth to your stomach. Esophagitis can cause soreness or pain in the esophagus. This condition can make it difficult and painful to swallow. What are the causes? Most causes of esophagitis are not serious. Common causes of this condition include:  Gastroesophageal reflux disease (GERD). This is when stomach contents move back up into the esophagus (reflux).  Repeated vomiting.  An allergic-type reaction, especially caused by food allergies (eosinophilic esophagitis).  Injury to the esophagus by swallowing large pills with or without water, or swallowing certain types of medicines.  Swallowing (ingesting) harmful chemicals, such as household cleaning products.  Heavy alcohol use.  An infection of the esophagus.This most often occurs in people who have a weakened immune system.  Radiation or chemotherapy treatment for cancer.  Certain diseases such as sarcoidosis, Crohn disease, and scleroderma. What are the signs or symptoms? Symptoms of this condition include:  Difficult or painful swallowing.  Pain with swallowing acidic liquids, such as citrus juices.  Pain with burping.  Chest pain.  Difficulty breathing.  Nausea.  Vomiting.  Pain in the abdomen.  Weight loss.  Ulcers in the mouth.  Patches of white material in the mouth (candidiasis).  Fever.  Coughing up blood or vomiting blood.  Stool that is black, tarry, or bright red. How is this diagnosed? Your health care provider will take a medical history and perform a physical exam. You may also have other tests, including:  An endoscopy to examine your stomach and esophagus with a small camera.  A test that measures the acidity level in your esophagus.  A test that measures how much pressure is on your esophagus.  A barium swallow or modified barium swallow to show the  shape, size, and functioning of your esophagus.  Allergy tests. How is this treated? Treatment for this condition depends on the cause of your esophagitis. In some cases, steroids or other medicines may be given to help relieve your symptoms or to treat the underlying cause of your condition. You may have to make some lifestyle changes, such as:  Avoiding alcohol.  Quitting smoking.  Changing your diet.  Exercising.  Changing your sleep habits and your sleep environment. Follow these instructions at home: Take these actions to decrease your discomfort and to help avoid complications. Diet  Follow a diet as recommended by your health care provider. This may involve avoiding foods and drinks such as:  Coffee and tea (with or without caffeine).  Drinks that contain alcohol.  Energy drinks and sports drinks.  Carbonated drinks or sodas.  Chocolate and cocoa.  Peppermint and mint flavorings.  Garlic and onions.  Horseradish.  Spicy and acidic foods, including peppers, chili powder, curry powder, vinegar, hot sauces, and barbecue sauce.  Citrus fruit juices and citrus fruits, such as oranges, lemons, and limes.  Tomato-based foods, such as red sauce, chili, salsa, and pizza with red sauce.  Fried and fatty foods, such as donuts, french fries, potato chips, and high-fat dressings.  High-fat meats, such as hot dogs and fatty cuts of red and white meats, such as rib eye steak, sausage, ham, and bacon.  High-fat dairy items, such as whole milk, butter, and cream cheese.  Eat small, frequent meals instead of large meals.  Avoid drinking large amounts of liquid with your meals.  Avoid eating meals during the 2-3 hours before bedtime.  Avoid lying down right after you eat.  Do not exercise right after you eat.  Avoid foods and drinks that seem to make your symptoms worse. General instructions  Pay attention to any changes in your symptoms.  Take over-the-counter and  prescription medicines only as told by your health care provider. Do not take aspirin, ibuprofen, or other NSAIDs unless your health care provider told you to do so.  If you have trouble taking pills, use a pill splitter to decrease the size of the pill. This will decrease the chance of the pill getting stuck or injuring your esophagus on the way down. Also, drink water after you take a pill.  Do not use any tobacco products, including cigarettes, chewing tobacco, and e-cigarettes. If you need help quitting, ask your health care provider.  Wear loose-fitting clothing. Do not wear anything tight around your waist that causes pressure on your abdomen.  Raise (elevate) the head of your bed about 6 inches (15 cm).  Try to reduce your stress, such as with yoga or meditation. If you need help reducing stress, ask your health care provider.  If you are overweight, reduce your weight to an amount that is healthy for you. Ask your health care provider for guidance about a safe weight loss goal.  Keep all follow-up visits as told by your health care provider. This is important. Contact a health care provider if:  You have new symptoms.  You have unexplained weight loss.  You have difficulty swallowing, or it hurts to swallow.  You have wheezing or a persistent cough.  Your symptoms do not improve with treatment.  You have frequent heartburn for more than two weeks. Get help right away if:  You have severe pain in your arms, neck, jaw, teeth, or back.  You feel sweaty, dizzy, or light-headed.  You have chest pain or shortness of breath.  You vomit and your vomit looks like blood or coffee grounds.  Your stool is bloody or black.  You have a fever.  You cannot swallow, drink, or eat. This information is not intended to replace advice given to you by your health care provider. Make sure you discuss any questions you have with your health care provider. Document Released: 12/31/2004  Document Revised: 04/30/2016 Document Reviewed: 03/20/2015  2017 [employer]

## 2017-01-07 NOTE — MAU Provider Note (Signed)
Chief Complaint:  Pelvic Pain and Pleurisy   First Provider Initiated Contact with Patient 01/07/17 1816     HPI: Jillian Bishop is a 21 y.o. G3P2002 at 80w5dwho presents to maternity admissions reporting pressure in her rectum for 3 days.  Does not change with bowel movements.  Has had daily BMs.  Also feels pain in her sternal area whenever she breathes, coughs, stands at work or sometimes just sitting there. Did not call MD office until today because she thought it would go away.. She reports good fetal movement, denies LOF, vaginal bleeding, vaginal itching/burning, urinary symptoms, h/a, dizziness, n/v, diarrhea, constipation or fever/chills.    Pelvic Pain  The patient's primary symptoms include pelvic pain. The patient's pertinent negatives include no genital itching, genital lesions, genital odor, genital rash or vaginal bleeding. This is a new problem. The current episode started in the past 7 days. The problem occurs constantly. The problem has been unchanged. The pain is mild. The problem affects both sides. She is pregnant. Pertinent negatives include no back pain, chills, constipation, diarrhea, dysuria, fever, frequency, headaches, nausea or vomiting. Nothing aggravates the symptoms. She has tried nothing for the symptoms.   RN Note: Pt reports she has been feeling a constant  "pressure in her but" x 3 days. Denies vaginal discharge or bleeding. Also c/o feeling pain in the middle of her chest when she takes a deep breath in.  Past Medical History: Past Medical History:  Diagnosis Date  . Asthma   . HPV (human papilloma virus) infection     Past obstetric history: OB History  Gravida Para Term Preterm AB Living  3 2 2     2   SAB TAB Ectopic Multiple Live Births        0 2    # Outcome Date GA Lbr Len/2nd Weight Sex Delivery Anes PTL Lv  3 Current           2 Term 01/20/16 [redacted]w[redacted]d  7 lb 0.1 oz (3.177 kg) M Vag-Spont None  LIV  1 Term 10/03/10 [redacted]w[redacted]d  5 lb 4 oz (2.381 kg) M  Vag-Spont None N LIV      Past Surgical History: Past Surgical History:  Procedure Laterality Date  . APPENDECTOMY      Family History: Family History  Problem Relation Age of Onset  . Hypertension Mother   . Hypertension Father   . Diabetes Father   . Hypertension Sister   . Heart disease Paternal Grandmother   . Hypertension Paternal Grandmother     Social History: Social History  Substance Use Topics  . Smoking status: Never Smoker  . Smokeless tobacco: Never Used  . Alcohol use No    Allergies: No Known Allergies  Meds:  Prescriptions Prior to Admission  Medication Sig Dispense Refill Last Dose  . albuterol (PROVENTIL HFA;VENTOLIN HFA) 108 (90 BASE) MCG/ACT inhaler Inhale 2 puffs into the lungs every 6 (six) hours as needed for wheezing or shortness of breath. Reported on 03/12/2016   Taking    I have reviewed patient's Past Medical Hx, Surgical Hx, Family Hx, Social Hx, medications and allergies.   ROS:  Review of Systems  Constitutional: Negative for chills and fever.  Gastrointestinal: Negative for constipation, diarrhea, nausea and vomiting.  Genitourinary: Positive for pelvic pain. Negative for dysuria and frequency.  Musculoskeletal: Negative for back pain.  Neurological: Negative for headaches.   Other systems negative  Physical Exam  Patient Vitals for the past 24 hrs:  BP  Temp Temp src Pulse Resp Height Weight  01/07/17 1800 122/58 98.7 F (37.1 C) Oral 84 18 5\' 5"  (1.651 m) 185 lb 1.9 oz (84 kg)   Constitutional: Well-developed, well-nourished female in no acute distress.  Cardiovascular: normal rate and rhythm, no ectopy Respiratory: normal effort, clear to auscultation bilaterally, no wheezing GI: Abd soft, non-tender, gravid appropriate for gestational age.   No rebound or guarding. MS: Extremities nontender, no edema, normal ROM Neurologic: Alert and oriented x 4.  GU: Neg CVAT.  PELVIC EXAM: Cervix pink, visually closed, without lesion,  scant white creamy discharge, vaginal walls and external genitalia normal    No bleeding seen.  Bimanual exam: Cervix firm, posterior, neg CMT, uterus nontender, Fundal Height consistent with dates, adnexa without tenderness, enlargement, or mass    Cervix and presenting parts are high in vagina, no pressure or bulging noted No excess stool in vault.   FHT:  Baseline 150 , moderate variability, accelerations present, no decelerations Contractions: Rare   Labs: No results found for this or any previous visit (from the past 24 hour(s)). --/--/O POS (02/13 0245) Results for orders placed or performed during the hospital encounter of 01/07/17 (from the past 72 hour(s))  Urinalysis, Routine w reflex microscopic     Status: Abnormal   Collection Time: 01/07/17  6:00 PM  Result Value Ref Range   Color, Urine YELLOW YELLOW   APPearance HAZY (A) CLEAR   Specific Gravity, Urine 1.027 1.005 - 1.030   pH 6.0 5.0 - 8.0   Glucose, UA NEGATIVE NEGATIVE mg/dL   Hgb urine dipstick SMALL (A) NEGATIVE   Bilirubin Urine NEGATIVE NEGATIVE   Ketones, ur NEGATIVE NEGATIVE mg/dL   Protein, ur NEGATIVE NEGATIVE mg/dL   Nitrite NEGATIVE NEGATIVE   Leukocytes, UA LARGE (A) NEGATIVE   RBC / HPF 6-30 0 - 5 RBC/hpf   WBC, UA 6-30 0 - 5 WBC/hpf   Bacteria, UA RARE (A) NONE SEEN   Squamous Epithelial / LPF 6-30 (A) NONE SEEN   Mucous PRESENT     Imaging:  No results found.  MAU Course/MDM: I have ordered labs and reviewed results.  12 lead EKG ordered.  Normal sinus rhythm. NST reviewed  Tracing is reassuring for this gestational age Consult Dr Marvel Plan with presentation, exam findings and test results.  Treatments in MAU included GI cocktail with some relief with pain down to "3".    Assessment: Single IUP at [redacted]w[redacted]d Esophagitis Substernal chest pain with normal EKG, somewhat relieved by GI cocktail Rectal pressure, uncertain cause  Plan: Discharge home Rx Protonix for presumed GERD Preterm  Labor precautions and fetal kick counts Follow up in Office for prenatal visits and recheck of status  Encouraged to return here or to other Urgent Care/ED if she develops worsening of symptoms, increase in pain, fever, or other concerning symptoms.   Pt stable at time of discharge.  Hansel Feinstein CNM, MSN Certified Nurse-Midwife 01/07/2017 6:25 PM

## 2017-02-10 LAB — OB RESULTS CONSOLE HIV ANTIBODY (ROUTINE TESTING): HIV: NONREACTIVE

## 2017-02-20 ENCOUNTER — Encounter (HOSPITAL_COMMUNITY): Payer: Self-pay

## 2017-02-20 ENCOUNTER — Inpatient Hospital Stay (HOSPITAL_COMMUNITY)
Admission: AD | Admit: 2017-02-20 | Discharge: 2017-02-20 | Disposition: A | Discharge status: Home / Self Care | Payer: Medicaid Other | Source: Ambulatory Visit | Attending: Obstetrics and Gynecology | Admitting: Obstetrics and Gynecology

## 2017-02-20 DIAGNOSIS — R109 Unspecified abdominal pain: Secondary | ICD-10-CM | POA: Diagnosis present

## 2017-02-20 DIAGNOSIS — O26893 Other specified pregnancy related conditions, third trimester: Secondary | ICD-10-CM | POA: Diagnosis present

## 2017-02-20 DIAGNOSIS — N949 Unspecified condition associated with female genital organs and menstrual cycle: Secondary | ICD-10-CM | POA: Diagnosis not present

## 2017-02-20 DIAGNOSIS — O98313 Other infections with a predominantly sexual mode of transmission complicating pregnancy, third trimester: Secondary | ICD-10-CM | POA: Insufficient documentation

## 2017-02-20 DIAGNOSIS — J45909 Unspecified asthma, uncomplicated: Secondary | ICD-10-CM | POA: Insufficient documentation

## 2017-02-20 DIAGNOSIS — A630 Anogenital (venereal) warts: Secondary | ICD-10-CM | POA: Diagnosis not present

## 2017-02-20 DIAGNOSIS — O368130 Decreased fetal movements, third trimester, not applicable or unspecified: Secondary | ICD-10-CM | POA: Insufficient documentation

## 2017-02-20 DIAGNOSIS — Z3A31 31 weeks gestation of pregnancy: Secondary | ICD-10-CM | POA: Insufficient documentation

## 2017-02-20 DIAGNOSIS — O99513 Diseases of the respiratory system complicating pregnancy, third trimester: Secondary | ICD-10-CM | POA: Insufficient documentation

## 2017-02-20 LAB — URINALYSIS, ROUTINE W REFLEX MICROSCOPIC
BILIRUBIN URINE: NEGATIVE
GLUCOSE, UA: NEGATIVE mg/dL
Hgb urine dipstick: NEGATIVE
Ketones, ur: NEGATIVE mg/dL
Nitrite: NEGATIVE
PH: 7 (ref 5.0–8.0)
Protein, ur: NEGATIVE mg/dL
SPECIFIC GRAVITY, URINE: 1.025 (ref 1.005–1.030)

## 2017-02-20 NOTE — Discharge Instructions (Signed)
Round Ligament Pain The round ligament is a cord of muscle and tissue that helps to support the uterus. It can become a source of pain during pregnancy if it becomes stretched or twisted as the baby grows. The pain usually begins in the second trimester of pregnancy, and it can come and go until the baby is delivered. It is not a serious problem, and it does not cause harm to the baby. Round ligament pain is usually a short, sharp, and pinching pain, but it can also be a dull, lingering, and aching pain. The pain is felt in the lower side of the abdomen or in the groin. It usually starts deep in the groin and moves up to the outside of the hip area. Pain can occur with:  A sudden change in position.  Rolling over in bed.  Coughing or sneezing.  Physical activity. Follow these instructions at home: Watch your condition for any changes. Take these steps to help with your pain:  When the pain starts, relax. Then try:  Sitting down.  Flexing your knees up to your abdomen.  Lying on your side with one pillow under your abdomen and another pillow between your legs.  Sitting in a warm bath for 15-20 minutes or until the pain goes away.  Take over-the-counter and prescription medicines only as told by your health care provider.  Move slowly when you sit and stand.  Avoid long walks if they cause pain.  Stop or lessen your physical activities if they cause pain. Contact a health care provider if:  Your pain does not go away with treatment.  You feel pain in your back that you did not have before.  Your medicine is not helping. Get help right away if:  You develop a fever or chills.  You develop uterine contractions.  You develop vaginal bleeding.  You develop nausea or vomiting.  You develop diarrhea.  You have pain when you urinate. This information is not intended to replace advice given to you by your health care provider. Make sure you discuss any questions you have with  your health care provider. Document Released: 09/01/2008 Document Revised: 04/30/2016 Document Reviewed: 01/30/2015 Elsevier Interactive Patient Education  2017 [employer].

## 2017-02-20 NOTE — MAU Provider Note (Signed)
History   G3P2002 @ 31 wks in with c/o bilat side pain for 3 days off and on.better when at reast. Denies ROM or vag bleeding.  CSN: [id number]  Arrival date & time 02/20/17  1346   None     Chief Complaint  Patient presents with  . Decreased Fetal Movement  . Abdominal Pain    HPI  Past Medical History:  Diagnosis Date  . Asthma   . HPV (human papilloma virus) infection     Past Surgical History:  Procedure Laterality Date  . APPENDECTOMY      Family History  Problem Relation Age of Onset  . Hypertension Mother   . Hypertension Father   . Diabetes Father   . Hypertension Sister   . Heart disease Paternal Grandmother   . Hypertension Paternal Grandmother     Social History  Substance Use Topics  . Smoking status: Never Smoker  . Smokeless tobacco: Never Used  . Alcohol use No    OB History    Gravida Para Term Preterm AB Living   3 2 2     2    SAB TAB Ectopic Multiple Live Births         0 2      Review of Systems  Constitutional: Negative.   HENT: Negative.   Eyes: Negative.   Respiratory: Negative.   Cardiovascular: Negative.   Gastrointestinal: Positive for abdominal pain.  Endocrine: Negative.   Genitourinary: Negative.   Musculoskeletal: Negative.   Skin: Negative.     Allergies  Patient has no known allergies.  Home Medications    BP (!) 116/55   Pulse 80   Temp 98.5 F (36.9 C)   Resp 18   Ht 5\' 4"  (1.626 m)   Wt 190 lb (86.2 kg)   LMP 07/18/2016   BMI 32.61 kg/m   Physical Exam  Constitutional: She is oriented to person, place, and time. She appears well-developed and well-nourished.  HENT:  Head: Normocephalic.  Eyes: Pupils are equal, round, and reactive to light.  Neck: Normal range of motion.  Cardiovascular: Normal rate, regular rhythm, normal heart sounds and intact distal pulses.   Pulmonary/Chest: Effort normal and breath sounds normal.  Abdominal: Soft. Bowel sounds are normal.  Genitourinary: Vagina normal  and uterus normal.  Musculoskeletal: Normal range of motion.  Neurological: She is alert and oriented to person, place, and time. She has normal reflexes.  Skin: Skin is warm and dry.  Psychiatric: She has a normal mood and affect. Her behavior is normal. Judgment and thought content normal.    MAU Course  Procedures (including critical care time)  Labs Reviewed  URINALYSIS, ROUTINE W REFLEX MICROSCOPIC   No results found.   No diagnosis found.    MDM  FHR pattern reassuring. VSS, Sve cl/firm/th/post high. POC discussed with Dr. Terri Piedra pt to be d/c home.

## 2017-02-20 NOTE — MAU Note (Signed)
Pt presents to MAU with complaints of lower abdominal cramping radiating into both of her sides with a decrease in fetal movement for 3 days. Denies any vaginal bleeding or abnormal discharge

## 2017-03-15 IMAGING — US US MFM OB COMP +14 WKS
1 series · 14 of 28 positions shown · non-contrast
Comparison: none

OBSTETRICS REPORT
(Signed Final 09/04/2015 [DATE])

Date:
Service(s) Provided
Indications
Basic anatomic survey                                  Z36
20 weeks gestation of pregnancy
Fetal Evaluation
Num Of             1
Fetuses:
Fetal Heart        143                          bpm
Rate:
Cardiac Activity:  Observed
Presentation:      Cephalic
Placenta:          Posterior, above cervical
os
P. Cord            Visualized
Insertion:
Amniotic Fluid
AFI FV:      Subjectively within normal limits
Larg Pckt:      5.5  cm
Biometry
BPD:     48.3   m    G. Age:   20w 4d                 CI:        75.18   70 - 86
m
FL/HC:      18.2   16.8 -
19.8
HC:     176.7   m    G. Age:   20w 1d        43  %    HC/AC:      1.16   1.09 -
AC:     152.9   m    G. Age:   20w 3d        56  %    FL/BPD
m                                     :
FL:      32.2   m    G. Age:   20w 0d        39  %    FL/AC:      21.1   20 - 24
HUM:     32.3   m    G. Age:   20w 6d        71  %
Est.         344   gm   0 lb 12 oz      51   %
FW:
Gestational Age
LMP:           21w 2d        Date:  04/08/15                  EDD:   01/13/16
U/S Today:     20w 2d                                         EDD:   01/20/16
Best:          20w 1d    Det. By:   Early Ultrasound          EDD:   01/21/16
(06/03/15)
Anatomy
Cranium:          Appears normal         Aortic Arch:       Appears normal
Fetal Cavum:      Appears normal         Ductal Arch:       Appears normal
Ventricles:       Appears normal         Diaphragm:         Appears normal
Choroid Plexus:   Appears normal         Stomach:           Appears normal,
left sided
Cerebellum:       Appears normal         Abdomen:           Appears normal
Posterior         Appears normal         Abdominal          Appears nml (cord
Fossa:                                   Wall:              insert, abd wall)
Nuchal Fold:      Appears normal         Cord Vessels:      Appears normal (3
vessel cord)
Face:             Appears normal         Kidneys:           Appear normal
(orbits and profile)
Lips:             Appears normal         Bladder:           Appears normal
Heart:            Appears normal         Spine:             Appears normal
(4CH, axis, and
situs)
RVOT:             Appears normal         Lower              Appears normal
Extremities:
LVOT:             Appears normal         Upper              Appears normal
Other:   Fetus appears to be a male. Heels appears normal.
Cervix Uterus Adnexa
Cervical Length:    4.4       cm
Cervix:       Normal appearance by transabdominal scan.
Appears closed, without funnelling.
Left Ovary:    Within normal limits.
Right Ovary:   Within normal limits.
Impression
INDICATION: 19 yr old [id number] at 79w8d for fetal anatomic
survey. Remote read.

[Series 1: us mfm ob comp +14 wks · 105 acquisitions, 14 frames shown]
[im 4/105]
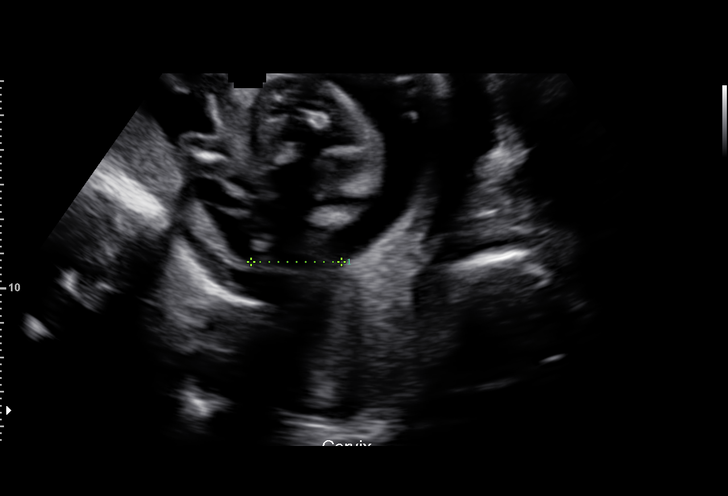
[im 12/105]
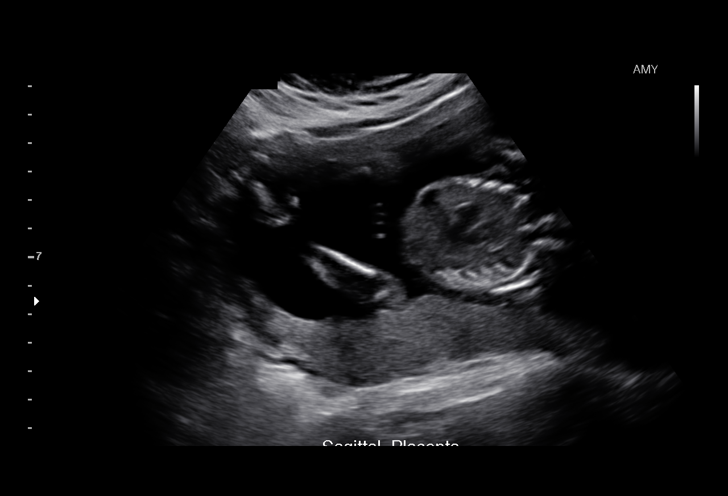
[im 20/105]
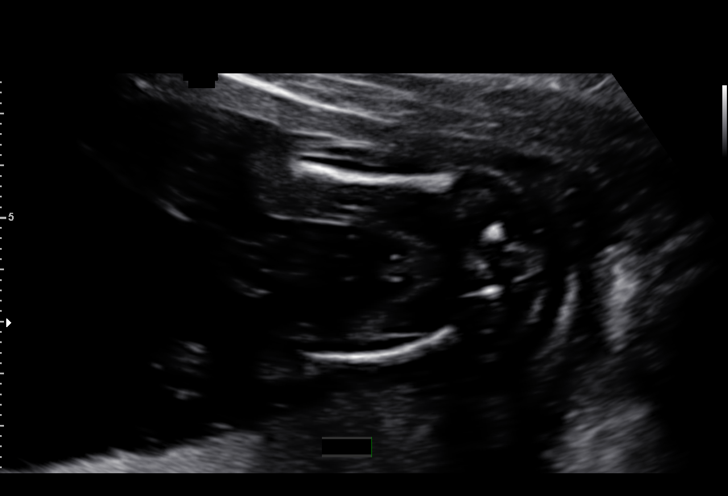
[im 27/105]
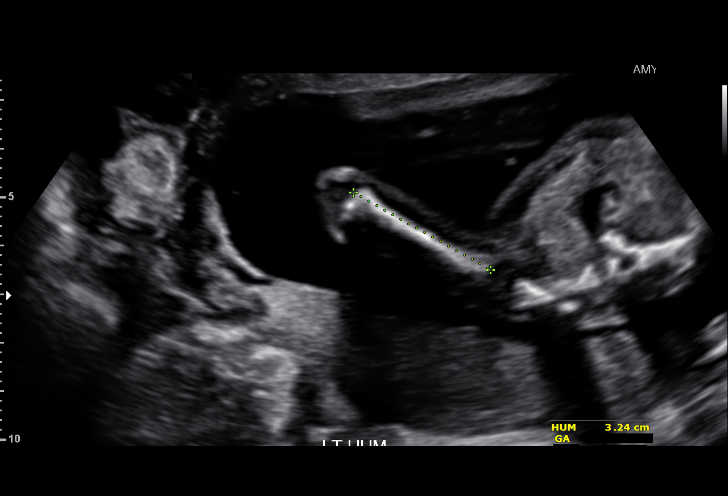
[im 35/105]
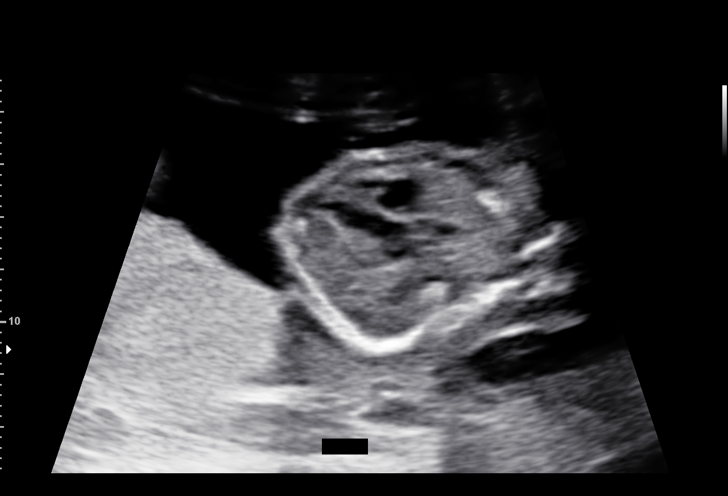
[im 43/105]
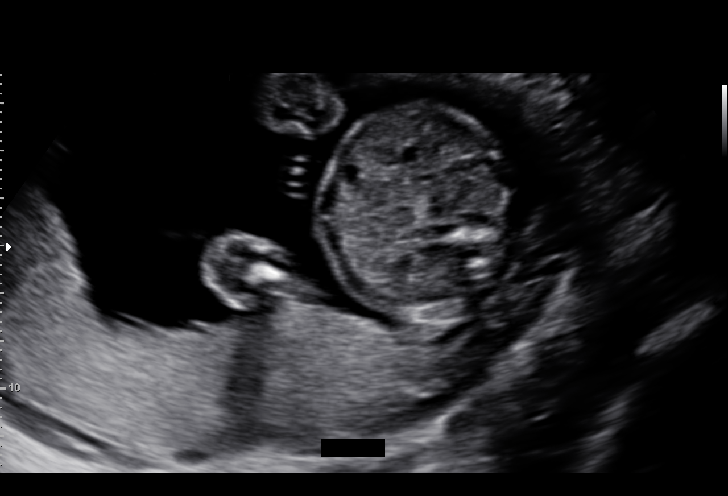
[im 51/105]
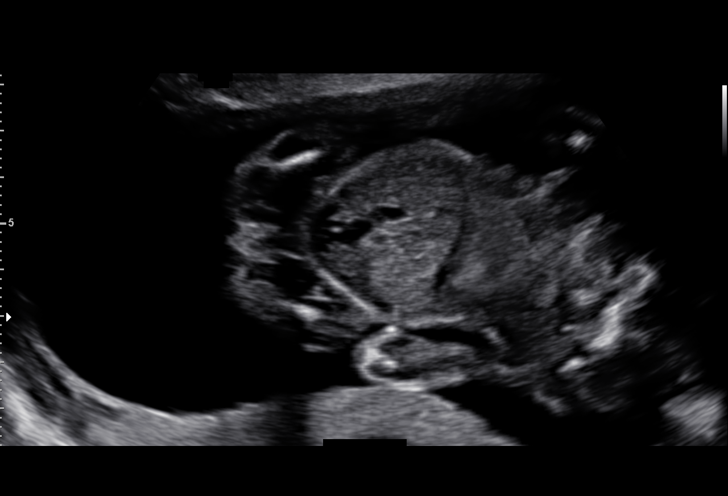
[im 58/105]
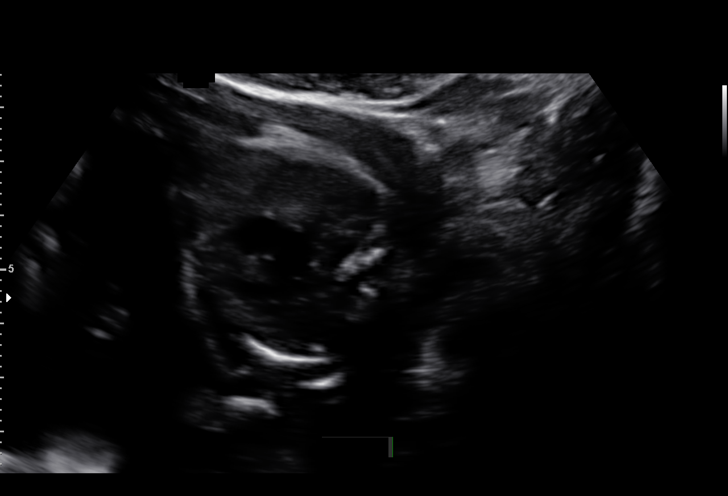
[im 66/105]
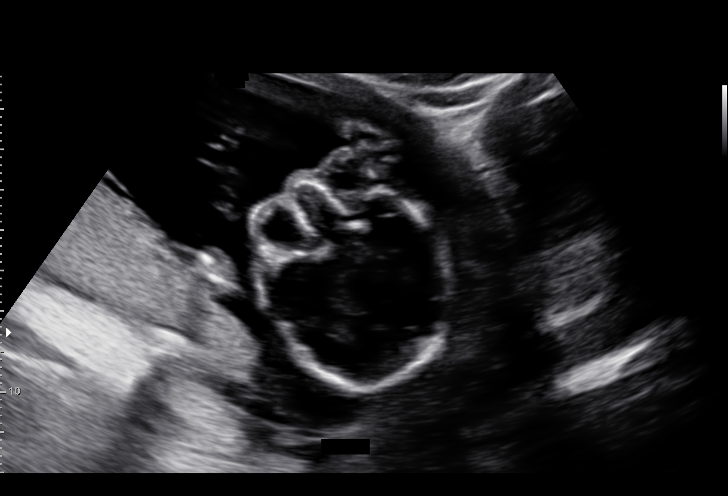
[im 74/105]
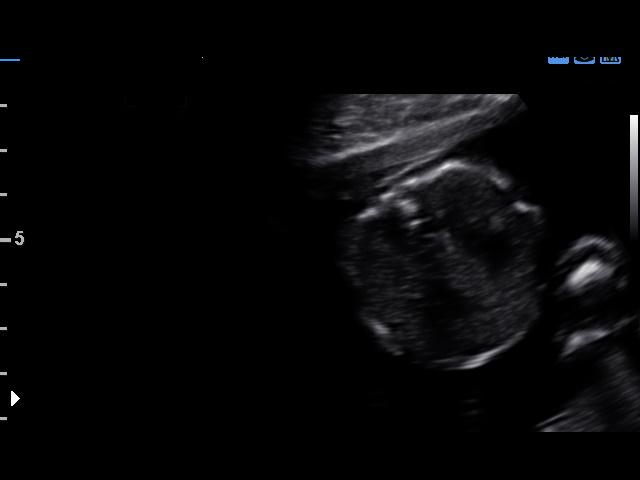
[im 81/105]
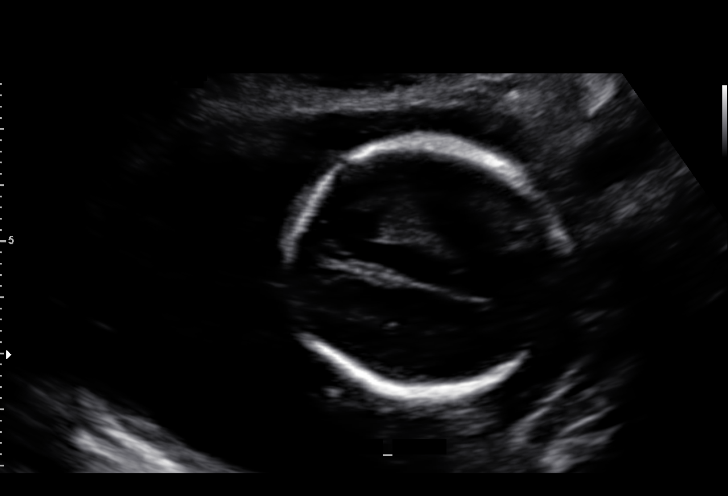
[im 89/105]
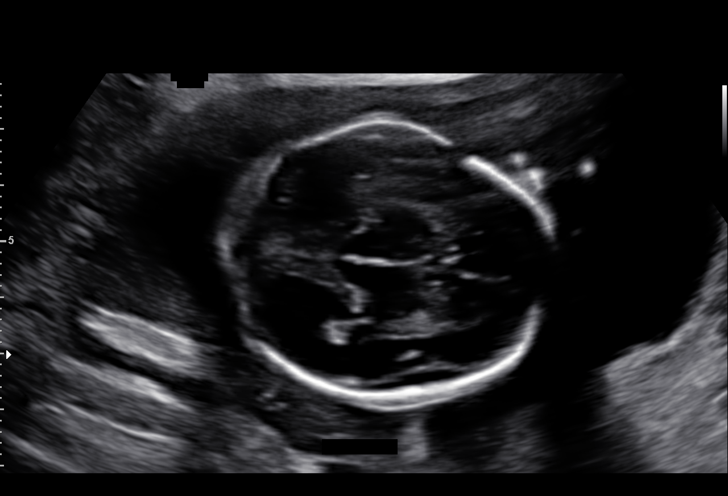
[im 97/105]
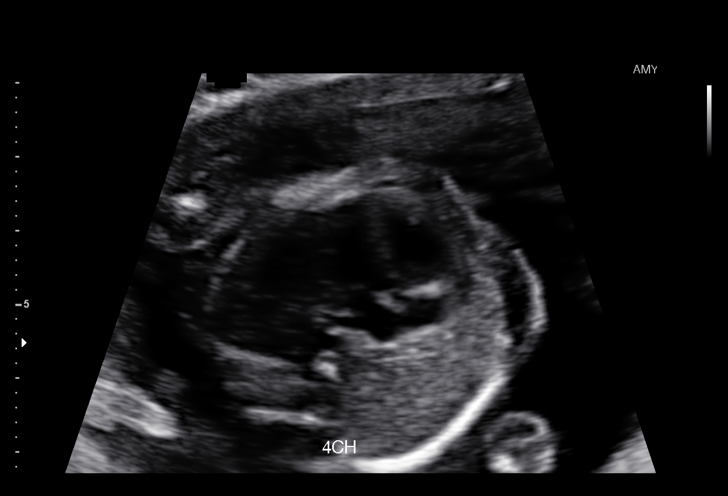
[im 105/105]
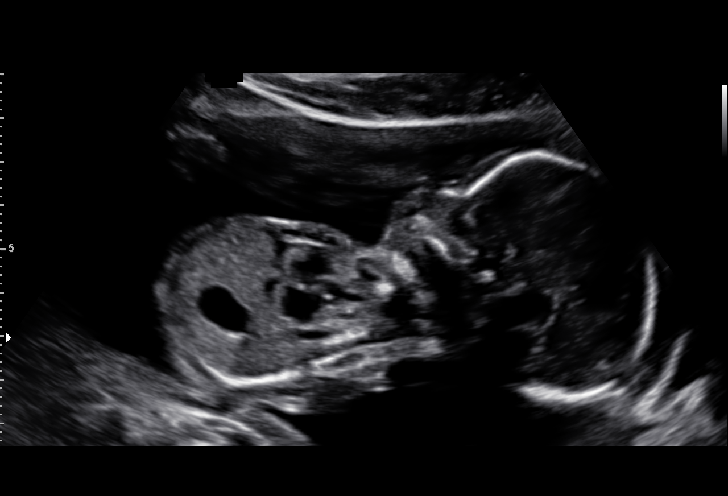

[14 of 28 positions shown; findings below may reference images not displayed]

FINDINGS: 1. Single intrauterine pregnancy.
2. Fetal biometry is consistent with dating.
3. Posterior placenta without evidence of previa.
4. Normal amniotic fluid volume.
5. Normal transabdominal cervical length.
6. Normal fetal anatomic survey.
Recommendations

1. Appropriate fetal growth.
2. Normal fetal anatomic survey.
3. Recommend follow up ultrasounds as clinically indicated.

## 2017-04-05 LAB — OB RESULTS CONSOLE GC/CHLAMYDIA
Chlamydia: NEGATIVE
Gonorrhea: NEGATIVE

## 2017-04-06 LAB — OB RESULTS CONSOLE GBS: STREP GROUP B AG: NEGATIVE

## 2017-04-22 ENCOUNTER — Encounter (HOSPITAL_COMMUNITY): Payer: Self-pay

## 2017-04-22 ENCOUNTER — Inpatient Hospital Stay (HOSPITAL_COMMUNITY)
Admission: AD | Admit: 2017-04-22 | Discharge: 2017-04-24 | DRG: 775 | Disposition: A | Discharge status: Home / Self Care | Payer: Medicaid Other | Source: Ambulatory Visit | Attending: Obstetrics and Gynecology | Admitting: Obstetrics and Gynecology

## 2017-04-22 DIAGNOSIS — J45909 Unspecified asthma, uncomplicated: Secondary | ICD-10-CM | POA: Diagnosis present

## 2017-04-22 DIAGNOSIS — O9952 Diseases of the respiratory system complicating childbirth: Secondary | ICD-10-CM | POA: Diagnosis present

## 2017-04-22 DIAGNOSIS — Z3A39 39 weeks gestation of pregnancy: Secondary | ICD-10-CM

## 2017-04-22 NOTE — MAU Note (Signed)
PT  SAYS SROM  AT  2300- CLEAR FLUID.     VE IN OFFICE  YESTERDAY    4 CM.   DENIES HSV AND  MRSA.  GBS- NEG  FEELS  SOME    UC'S.

## 2017-04-23 ENCOUNTER — Encounter (HOSPITAL_COMMUNITY): Payer: Self-pay | Admitting: *Deleted

## 2017-04-23 DIAGNOSIS — O9952 Diseases of the respiratory system complicating childbirth: Secondary | ICD-10-CM | POA: Diagnosis present

## 2017-04-23 DIAGNOSIS — Z3A39 39 weeks gestation of pregnancy: Secondary | ICD-10-CM | POA: Diagnosis not present

## 2017-04-23 DIAGNOSIS — J45909 Unspecified asthma, uncomplicated: Secondary | ICD-10-CM | POA: Diagnosis present

## 2017-04-23 DIAGNOSIS — Z3493 Encounter for supervision of normal pregnancy, unspecified, third trimester: Secondary | ICD-10-CM | POA: Diagnosis present

## 2017-04-23 LAB — CBC
HCT: 31.3 % — ABNORMAL LOW (ref 36.0–46.0)
Hemoglobin: 10.3 g/dL — ABNORMAL LOW (ref 12.0–15.0)
MCH: 25.8 pg — AB (ref 26.0–34.0)
MCHC: 32.9 g/dL (ref 30.0–36.0)
MCV: 78.3 fL (ref 78.0–100.0)
PLATELETS: 349 10*3/uL (ref 150–400)
RBC: 4 MIL/uL (ref 3.87–5.11)
RDW: 15.2 % (ref 11.5–15.5)
WBC: 12 10*3/uL — ABNORMAL HIGH (ref 4.0–10.5)

## 2017-04-23 LAB — TYPE AND SCREEN
ABO/RH(D): O POS
Antibody Screen: NEGATIVE

## 2017-04-23 LAB — POCT FERN TEST: POCT Fern Test: POSITIVE

## 2017-04-23 MED ORDER — OXYTOCIN 40 UNITS IN LACTATED RINGERS INFUSION - SIMPLE MED: 2.5000 [IU]/h | INTRAVENOUS | Status: DC

## 2017-04-23 MED ORDER — ONDANSETRON HCL 4 MG/2ML IJ SOLN: 4.0000 mg | Freq: Four times a day (QID) | INTRAMUSCULAR | Status: DC | PRN

## 2017-04-23 MED ORDER — WITCH HAZEL-GLYCERIN EX PADS
1.0000 | MEDICATED_PAD | CUTANEOUS | Status: DC | PRN
Start: 2017-04-23 — End: 2017-04-24

## 2017-04-23 MED ORDER — BENZOCAINE-MENTHOL 20-0.5 % EX AERO
1.0000 "application " | INHALATION_SPRAY | CUTANEOUS | Status: DC | PRN
  Administered 2017-04-23: 1 via TOPICAL
  Filled 2017-04-23: qty 56

## 2017-04-23 MED ORDER — DIBUCAINE 1 % RE OINT
1.0000 | TOPICAL_OINTMENT | RECTAL | Status: DC | PRN
Start: 2017-04-23 — End: 2017-04-24

## 2017-04-23 MED ORDER — LACTATED RINGERS IV SOLN: 500.0000 mL | INTRAVENOUS | Status: DC | PRN

## 2017-04-23 MED ORDER — PRENATAL MULTIVITAMIN CH
1.0000 | ORAL_TABLET | Freq: Every day | ORAL | Status: DC
  Administered 2017-04-23 – 2017-04-24 (×2): 1 via ORAL
  Filled 2017-04-23 (×2): qty 1

## 2017-04-23 MED ORDER — ACETAMINOPHEN 325 MG PO TABS: 650.0000 mg | ORAL_TABLET | ORAL | Status: DC | PRN

## 2017-04-23 MED ORDER — SENNOSIDES-DOCUSATE SODIUM 8.6-50 MG PO TABS
2.0000 | ORAL_TABLET | ORAL | Status: DC
  Administered 2017-04-23: 2 via ORAL
  Filled 2017-04-23: qty 2

## 2017-04-23 MED ORDER — OXYCODONE-ACETAMINOPHEN 5-325 MG PO TABS: 2.0000 | ORAL_TABLET | ORAL | Status: DC | PRN

## 2017-04-23 MED ORDER — BUTORPHANOL TARTRATE 1 MG/ML IJ SOLN
1.0000 mg | INTRAMUSCULAR | Status: DC | PRN
  Administered 2017-04-23 (×2): 1 mg via INTRAVENOUS
  Filled 2017-04-23 (×2): qty 1

## 2017-04-23 MED ORDER — LIDOCAINE HCL (PF) 1 % IJ SOLN
30.0000 mL | INTRAMUSCULAR | Status: DC | PRN
  Filled 2017-04-23: qty 30

## 2017-04-23 MED ORDER — ONDANSETRON HCL 4 MG PO TABS: 4.0000 mg | ORAL_TABLET | ORAL | Status: DC | PRN

## 2017-04-23 MED ORDER — IBUPROFEN 600 MG PO TABS
600.0000 mg | ORAL_TABLET | Freq: Four times a day (QID) | ORAL | Status: DC
  Administered 2017-04-23 – 2017-04-24 (×4): 600 mg via ORAL
  Filled 2017-04-23 (×4): qty 1

## 2017-04-23 MED ORDER — OXYTOCIN BOLUS FROM INFUSION
500.0000 mL | Freq: Once | INTRAVENOUS | Status: AC
  Administered 2017-04-23: 500 mL via INTRAVENOUS

## 2017-04-23 MED ORDER — FLEET ENEMA 7-19 GM/118ML RE ENEM: 1.0000 | ENEMA | RECTAL | Status: DC | PRN

## 2017-04-23 MED ORDER — SOD CITRATE-CITRIC ACID 500-334 MG/5ML PO SOLN: 30.0000 mL | ORAL | Status: DC | PRN

## 2017-04-23 MED ORDER — TETANUS-DIPHTH-ACELL PERTUSSIS 5-2.5-18.5 LF-MCG/0.5 IM SUSP: 0.5000 mL | Freq: Once | INTRAMUSCULAR | Status: DC

## 2017-04-23 MED ORDER — COCONUT OIL OIL: 1.0000 "application " | TOPICAL_OIL | Status: DC | PRN

## 2017-04-23 MED ORDER — DIPHENHYDRAMINE HCL 25 MG PO CAPS: 25.0000 mg | ORAL_CAPSULE | Freq: Four times a day (QID) | ORAL | Status: DC | PRN

## 2017-04-23 MED ORDER — ZOLPIDEM TARTRATE 5 MG PO TABS: 5.0000 mg | ORAL_TABLET | Freq: Every evening | ORAL | Status: DC | PRN

## 2017-04-23 MED ORDER — OXYTOCIN 40 UNITS IN LACTATED RINGERS INFUSION - SIMPLE MED
1.0000 m[IU]/min | INTRAVENOUS | Status: DC
  Administered 2017-04-23: 2 m[IU]/min via INTRAVENOUS
  Filled 2017-04-23: qty 1000

## 2017-04-23 MED ORDER — OXYCODONE-ACETAMINOPHEN 5-325 MG PO TABS: 1.0000 | ORAL_TABLET | ORAL | Status: DC | PRN

## 2017-04-23 MED ORDER — SIMETHICONE 80 MG PO CHEW: 80.0000 mg | CHEWABLE_TABLET | ORAL | Status: DC | PRN

## 2017-04-23 MED ORDER — ALBUTEROL SULFATE (2.5 MG/3ML) 0.083% IN NEBU: 3.0000 mL | INHALATION_SOLUTION | Freq: Four times a day (QID) | RESPIRATORY_TRACT | Status: DC | PRN

## 2017-04-23 MED ORDER — ONDANSETRON HCL 4 MG/2ML IJ SOLN
4.0000 mg | INTRAMUSCULAR | Status: DC | PRN
Start: 2017-04-23 — End: 2017-04-24

## 2017-04-23 MED ORDER — TERBUTALINE SULFATE 1 MG/ML IJ SOLN
0.2500 mg | Freq: Once | INTRAMUSCULAR | Status: DC | PRN
  Filled 2017-04-23: qty 1

## 2017-04-23 MED ORDER — LACTATED RINGERS IV SOLN
INTRAVENOUS | Status: DC
  Administered 2017-04-23 (×2): via INTRAVENOUS

## 2017-04-23 NOTE — Progress Notes (Signed)
Patient ID: Jillian Bishop, female   DOB: [birth year], 21 y.o.   MRN: [id number] Pt wondering why labor taking so long, d/w her probably has a forebag that needs   rupturing  Received stadol x 1  afeb VSS FHR Category 1prior to stadol, now with some decreased LTV  Bseline 115-120  70/5-6/-2 Forebag ruptured  Will follow progress Hopefully now will progress more rapidly

## 2017-04-23 NOTE — H&P (Signed)
TRANESHA LESSNER is a 21 y.o. female G3P2002 at 39+ with SROM.  ROM confirmed in MAU with + fern.  Relatively uncomplicated PNC.  Received Tdap and Flu in [facility].  Pregnancy dated by first trimester [place].  Essential panel and first trimester screen are negative.  Short intrapregnancy interval.    OB History    Gravida Para Term Preterm AB Living   3 2 2     2    SAB TAB Ectopic Multiple Live Births         0 2    no abn pap No STD G1 5# SVD female G2 7# SVD female G3 present  Past Medical History:  Diagnosis Date  . Asthma   . HPV (human papilloma virus) infection    Past Surgical History:  Procedure Laterality Date  . APPENDECTOMY     Family History: family history includes Diabetes in her father; Heart disease in her paternal grandmother; Hypertension in her father, mother, paternal grandmother, and sister. Social History:  reports that she has never smoked. She has never used smokeless tobacco. She reports that she does not drink alcohol or use drugs.  Meds PNV All: NKDA     Maternal Diabetes: No Genetic Screening: Normal Maternal Ultrasounds/Referrals: Normal Fetal Ultrasounds or other Referrals:  None Maternal Substance Abuse:  No Significant Maternal Medications:  None Significant Maternal Lab Results:  Lab values include: Group B Strep negative Other Comments:  None  Review of Systems  Constitutional: Negative.   HENT: Negative.   Eyes: Negative.   Respiratory: Negative.   Cardiovascular: Negative.   Gastrointestinal: Negative.   Genitourinary: Negative.   Musculoskeletal: Negative.   Skin: Negative.   Neurological: Negative.   Psychiatric/Behavioral: Negative.    Maternal Medical History:  Reason for admission: Rupture of membranes.   Contractions: Frequency: irregular.   Perceived severity is mild.    Fetal activity: Perceived fetal activity is normal.    Prenatal Complications - Diabetes: none.    Dilation: 5 Effacement (%): 60 Station: -3 Exam by::  J.Follmer,RNC Blood pressure (!) 118/59, pulse 84, temperature 98 F (36.7 C), temperature source Oral, resp. rate 18, height 5\' 3"  (1.6 m), weight 88.7 kg (195 lb 8 oz), last menstrual period 07/18/2016, SpO2 99 %, not currently breastfeeding. Maternal Exam:  Abdomen: Patient reports no abdominal tenderness. Fundal height is appropriate for gestation.   Estimated fetal weight is 7-8#.   Fetal presentation: vertex  Introitus: Normal vulva. Normal vagina.  Ferning test: positive.      Physical Exam  Constitutional: She is oriented to person, place, and time. She appears well-developed and well-nourished.  HENT:  Head: Normocephalic and atraumatic.  Cardiovascular: Normal rate and regular rhythm.   Respiratory: Effort normal and breath sounds normal. No respiratory distress. She has no wheezes.  GI: Soft. Bowel sounds are normal. She exhibits no distension. There is no tenderness.  Musculoskeletal: Normal range of motion.  Neurological: She is alert and oriented to person, place, and time.  Skin: Skin is warm and dry.  Psychiatric: She has a normal mood and affect. Her behavior is normal.    Prenatal labs: ABO, Rh: --/--/O POS (05/18 0020) Antibody: NEG (05/18 0020) Rubella: Immune (10/25 0000) RPR: Nonreactive (10/25 0000)  HBsAg: Negative (10/25 0000)  HIV: Non-reactive (03/07 0000)  GBS: Negative (05/01 0000)   Tdap 3/7, Flu 10/25  Hgb 12.3/Ur Cx neg/ GC neg/Chl neg/varicella immune/glucola 87/Hgb electro WNL First trimester screen WNL First trimester [place] dates pregnancy Nl anat, female  Assessment/Plan: 21yo G3P2002 at 39+ with SROM Declines epidural Augment with pitocin gbbs neg Expect SVD   Shauntia Levengood Bovard-Stuckert 04/23/2017, 7:37 AM

## 2017-04-23 NOTE — Lactation Note (Signed)
This note was copied from a baby's chart. Lactation Consultation Note  Patient Name: Jillian Bishop Date: 04/23/2017 Reason for consult: Initial assessment   Initial assessment with exp BF mom of < 1 hour old infant in [place]. Infant STS with mom and crying intermittently. Mom reports she BF her 21 yo for 3 months and he then stopped latching.   Mom with soft compressible breasts and semi compressible areola with short shaft everted nipples. Mom was able to demonstrate hand expression to right breast with colostrum easily expressible.   Assisted mom in attempting to latch infant to right breast in the laid back cross cradle hold, infant was not interested in suckling at this time. Infant left STS with mom. Enc mom to offer breast when infant ready to feed. Infant noted to have small amount clear oral secretions coming from mouth.  Enc mom to feed infant STS 8-12 x in 24 hours at first feeding cues. Reviewed BF basics, pillow support, hand expression before and after feedings. Colostrum, milk coming to volume and cluster feeding.   BF Resources Handout and [place] Brochure given, mom informed of IP/OP Services, BF Support Groups and [place] phone #. Mom is a [facility] client and is aware to call and make appt post d/c. Mom does not have a pump at home. Mom without questions/concerns at this time. Enc mom to call out for feeding assistance/questions as needed.    Maternal Data Formula Feeding for Exclusion: No Has patient been taught Hand Expression?: Yes Does the patient have breastfeeding experience prior to this delivery?: Yes  Feeding Feeding Type: Breast Fed  LATCH Score/Interventions Latch: Too sleepy or reluctant, no latch achieved, no sucking elicited. Intervention(s): Skin to skin;Teach feeding cues;Waking techniques  Audible Swallowing: None Intervention(s): Hand expression;Skin to skin  Type of Nipple: Everted at rest and after stimulation  Comfort (Breast/Nipple): Soft /  non-tender     Hold (Positioning): Assistance needed to correctly position infant at breast and maintain latch. Intervention(s): Breastfeeding basics reviewed;Support Pillows;Position options;Skin to skin  LATCH Score: 5  Lactation Tools Discussed/Used WIC Program: Yes   Consult Status Consult Status: Follow-up Date: 04/24/17 Follow-up type: In-patient    Jillian Bishop 04/23/2017, 10:52 AM

## 2017-04-23 NOTE — Progress Notes (Signed)
Nurse at bedside to assist mom w/latching infant.  Nurse noted pt to have short shafted nipples.  Pt able to express colostrum. Pt attempted to latch infant with football hold.  Infant unable to maintain latch.  Nurse encouraged pt to try laid back position.  Infant unable to sustain latch.  Pt informed Nurse that "I had to pump for my son because he wasn't able to maintain a latch."  Nurse obtained size 20 nipple shield, hand pump and shells. Pt instructed on use of nipple shield, and care of shield.  Pt inst to pump with hand pump prior and after each session to assist with stretching nipple and enticing infant to latch.  Pt vu.  Nurse will follow up with pt.

## 2017-04-24 LAB — CBC
HCT: 31 % — ABNORMAL LOW (ref 36.0–46.0)
Hemoglobin: 10.2 g/dL — ABNORMAL LOW (ref 12.0–15.0)
MCH: 26 pg (ref 26.0–34.0)
MCHC: 32.9 g/dL (ref 30.0–36.0)
MCV: 78.9 fL (ref 78.0–100.0)
PLATELETS: 321 10*3/uL (ref 150–400)
RBC: 3.93 MIL/uL (ref 3.87–5.11)
RDW: 15.3 % (ref 11.5–15.5)
WBC: 12.2 10*3/uL — AB (ref 4.0–10.5)

## 2017-04-24 LAB — RPR: RPR: NONREACTIVE

## 2017-04-24 MED ORDER — IBUPROFEN 600 MG PO TABS: 600.0000 mg | ORAL_TABLET | Freq: Four times a day (QID) | ORAL | 0 refills | Status: DC

## 2017-04-24 NOTE — Lactation Note (Signed)
This note was copied from a baby's chart. Lactation Consultation Note  Patient Name: Jillian Bishop [id number] Date: 04/24/2017  Mom states baby just finished a feeding without using a nipple shield.  Explained to mom that we need to do a latch score before she goes home.  Instructed mom to call out for lactation when baby starts to cue.   Maternal Data    Feeding Feeding Type: Breast Fed Length of feed: 5 min  LATCH Score/Interventions                      Lactation Tools Discussed/Used     Consult Status      Carla Drape S 04/24/2017, 12:00 PM

## 2017-04-24 NOTE — Progress Notes (Signed)
Post Partum Day 1 Subjective: no complaints, voiding and tolerating PO.  Requests early d/c  Objective: Blood pressure (!) 109/52, pulse 62, temperature 98.2 F (36.8 C), temperature source Oral, resp. rate 16, height 5\' 3"  (1.6 m), weight 88.7 kg (195 lb 8 oz), last menstrual period 07/18/2016, SpO2 99 %, unknown if currently breastfeeding.  Physical Exam:  General: alert and cooperative Lochia: appropriate Uterine Fundus: firm    Recent Labs  04/23/17 0020 04/24/17 0531  HGB 10.3* 10.2*  HCT 31.3* 31.0*    Assessment/Plan: Discharge home if baby able to go   LOS: 1 day   Logan Bores 04/24/2017, 10:54 AM

## 2017-04-24 NOTE — Lactation Note (Signed)
This note was copied from a baby's chart. Lactation Consultation Note  Patient Name: Jillian Bishop [id number] Date: 04/24/2017 Reason for consult: Follow-up assessment Mom independently latched baby well to breast.  Baby nursing actively with audible swallows. Discussed milk coming to volume and engorgement treatment.  Instructed on waking techniques and breast massage during feeding.  Reviewed feeding with any feeding cue.  Outpatient lactation services and support information reviewed and encouraged prn.  Maternal Data    Feeding Feeding Type: Breast Fed Length of feed: 10 min  LATCH Score/Interventions Latch: Grasps breast easily, tongue down, lips flanged, rhythmical sucking. Intervention(s): Teach feeding cues Intervention(s): Breast massage  Audible Swallowing: Spontaneous and intermittent  Type of Nipple: Everted at rest and after stimulation  Comfort (Breast/Nipple): Soft / non-tender     Hold (Positioning): No assistance needed to correctly position infant at breast. Intervention(s): Breastfeeding basics reviewed;Support Pillows  LATCH Score: 10  Lactation Tools Discussed/Used     Consult Status Consult Status: Complete    [name] 04/24/2017, 2:43 PM

## 2017-04-24 NOTE — Discharge Summary (Signed)
OB Discharge Summary     Patient Name: Jillian Bishop DOB: [birth year] MRN: [id number]  Date of admission: 04/22/2017 Delivering MD: Paula Compton   Date of discharge: 04/24/2017  Admitting diagnosis: 38.3 WEEKS ROM Intrauterine pregnancy: [redacted]w[redacted]d     Secondary diagnosis:  Active Problems:   Indication for care in labor or delivery   NSVD (normal spontaneous vaginal delivery)  Additional problems: none     Discharge diagnosis: Term Pregnancy Delivered                                                                                                Post partum procedures:none  Augmentation: Pitocin  Complications: None  Hospital course:  Onset of Labor With Vaginal Delivery     21 y.o. yo G3P3003 at [redacted]w[redacted]d was admitted in Active Labor on 04/22/2017. Patient had an uncomplicated labor course as follows:  Membrane Rupture Time/Date: 11:00 PM ,04/22/2017   Intrapartum Procedures: Episiotomy: None [1]                                         Lacerations:  None [1]  Patient had a delivery of a Viable infant. 04/23/2017  Information for the patient's newborn:  Jillian, Bishop [[id number]]  Delivery Method: Vaginal, Spontaneous Delivery (Filed from Delivery Summary)    Pateint had an uncomplicated postpartum course.  She is ambulating, tolerating a regular diet, passing flatus, and urinating well. Patient is discharged home in stable condition on 04/24/17.   Physical exam  Vitals:   04/23/17 1250 04/23/17 1650 04/24/17 0113 04/24/17 0549  BP: (!) 110/53 (!) 109/50 (!) 102/58 (!) 109/52  Pulse: 72 70 72 62  Resp: 18 16 16 16   Temp: 97.9 F (36.6 C) 98.2 F (36.8 C)  98.2 F (36.8 C)  TempSrc: Oral Oral  Oral  SpO2:      Weight:      Height:       General: alert and cooperative Lochia: appropriate Uterine Fundus: firm  Labs: Lab Results  Component Value Date   WBC 12.2 (H) 04/24/2017   HGB 10.2 (L) 04/24/2017   HCT 31.0 (L) 04/24/2017   MCV 78.9 04/24/2017   PLT  321 04/24/2017   CMP Latest Ref Rng & Units 11/13/2015  Glucose 65 - 99 mg/dL 80    Discharge instruction: per After Visit Summary and "Baby and Me Booklet".  After visit meds:  Allergies as of 04/24/2017   No Known Allergies     Medication List    TAKE these medications   albuterol 108 (90 Base) MCG/ACT inhaler Commonly known as:  PROVENTIL HFA;VENTOLIN HFA Inhale 2 puffs into the lungs every 6 (six) hours as needed for wheezing or shortness of breath. Reported on 03/12/2016   ibuprofen 600 MG tablet Commonly known as:  ADVIL,MOTRIN Take 1 tablet (600 mg total) by mouth every 6 (six) hours.   prenatal multivitamin Tabs tablet Take 1 tablet by mouth daily at 12 noon.  Diet: routine diet  Activity: Advance as tolerated. Pelvic rest for 6 weeks.   Outpatient follow up:6 weeks Follow up Appt:No future appointments. Follow up Visit:No Follow-up on file.  Postpartum contraception: Undecided  Newborn Data: Live born female  Birth Weight: 6 lb 8 oz (2948 g) APGAR: 9, 9  Baby Feeding: Breast Disposition:home with mother   04/24/2017 Jillian Bores, MD

## 2017-04-27 ENCOUNTER — Inpatient Hospital Stay (HOSPITAL_COMMUNITY): Payer: Medicaid Other

## 2017-07-04 IMAGING — US US MFM OB FOLLOW-UP
1 series · 13 of 28 positions shown · non-contrast
Comparison: none

[Series 1: us mfm ob follow-up · 0.23mm/px · 33 acquisitions, 13 frames shown]
[im 2/33]
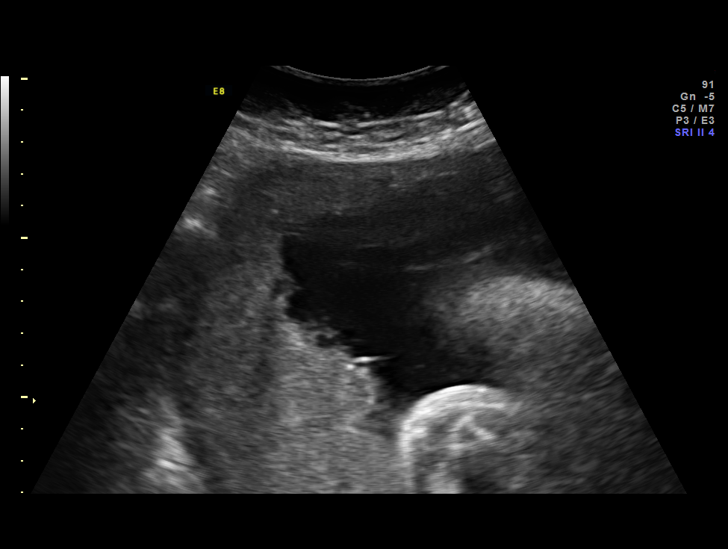
[im 4/33]
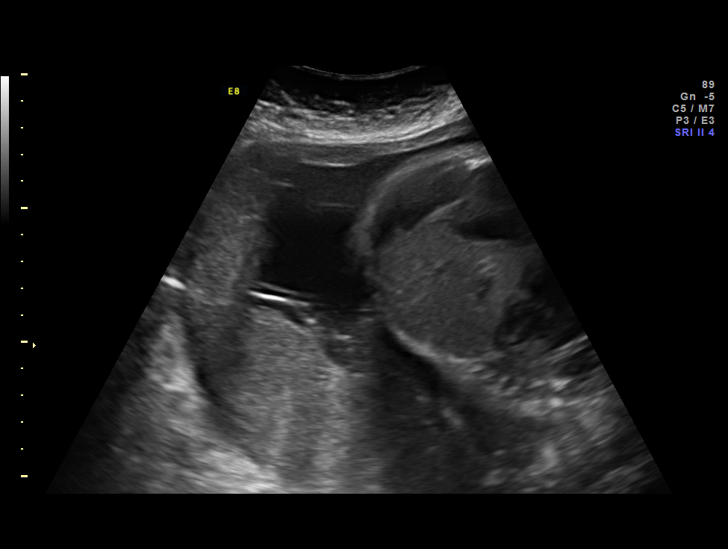
[im 6/33]
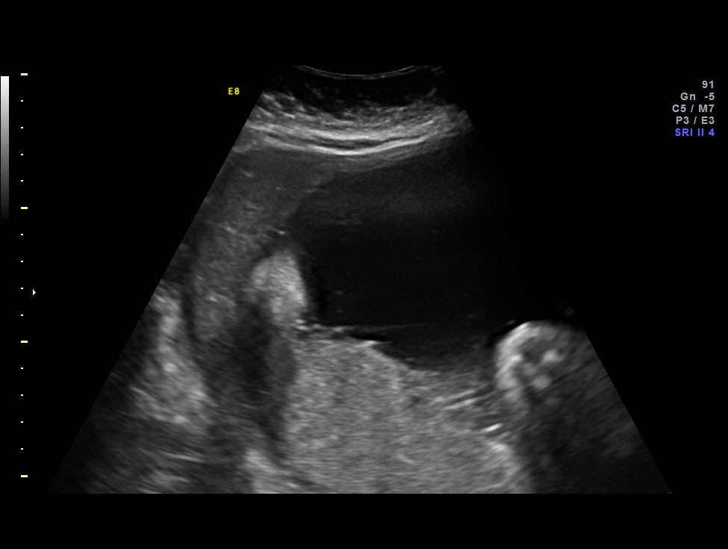
[im 9/33]
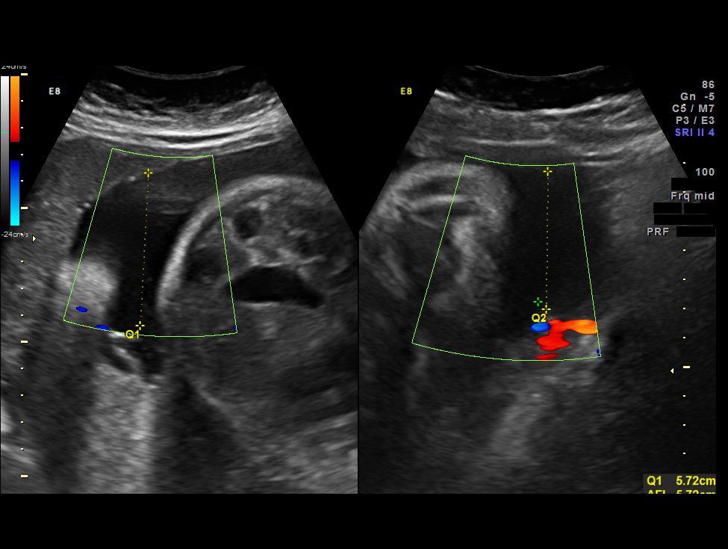
[im 11/33]
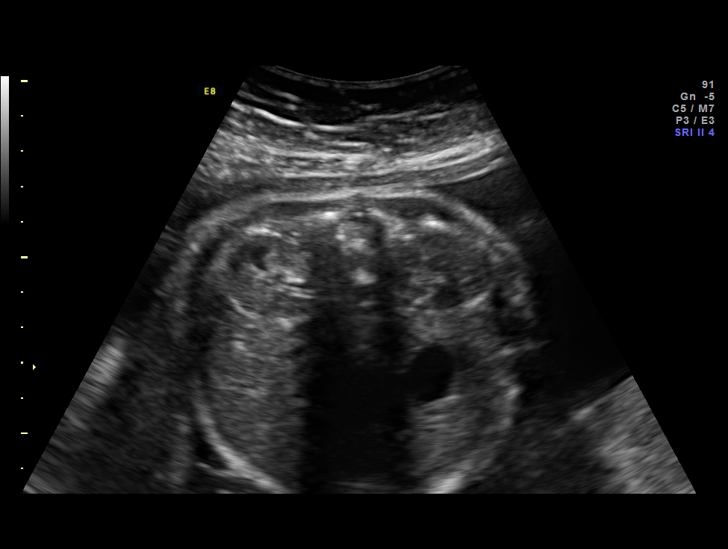
[im 14/33]
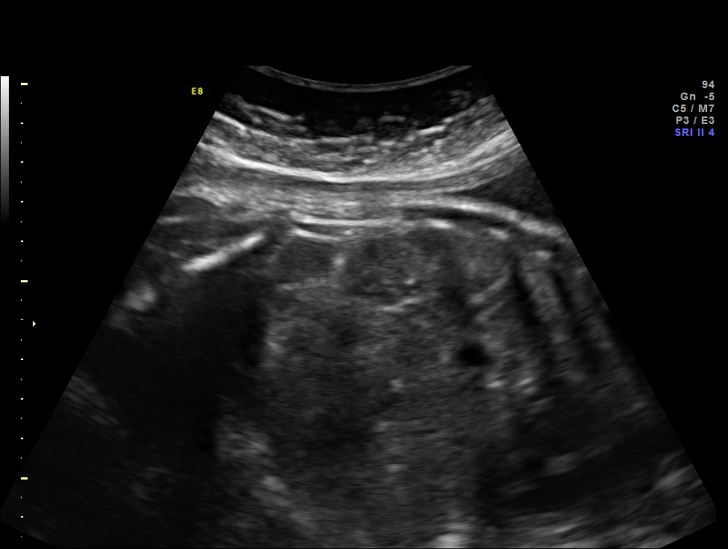
[im 17/33]
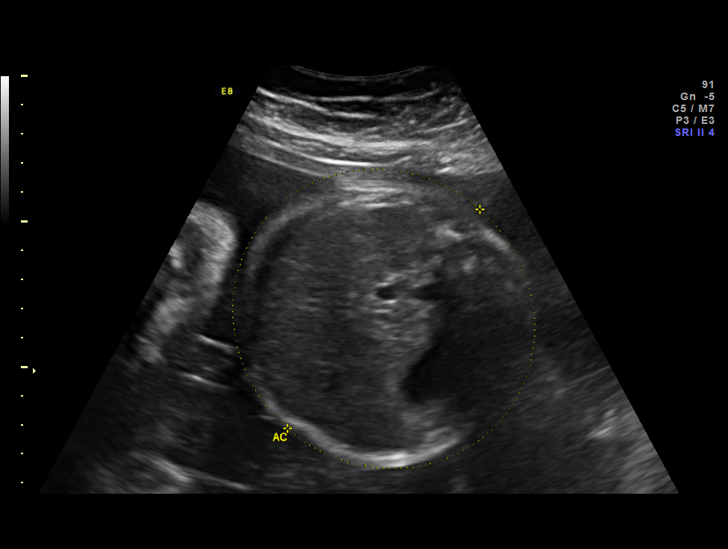
[im 19/33]
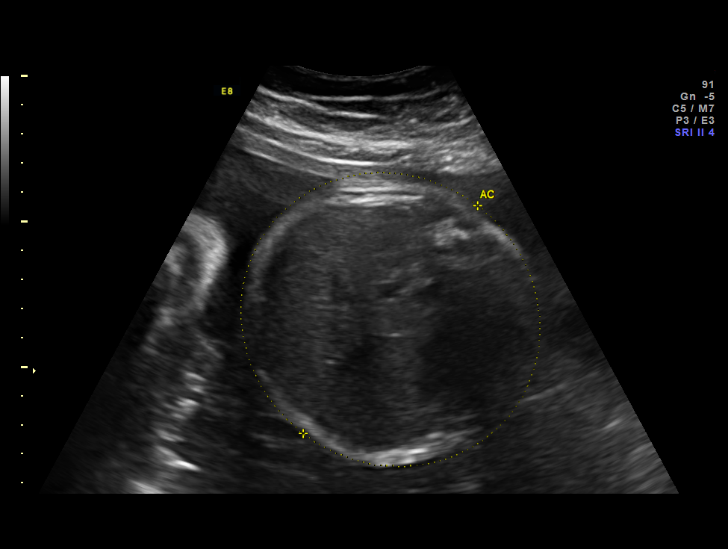
[im 22/33]
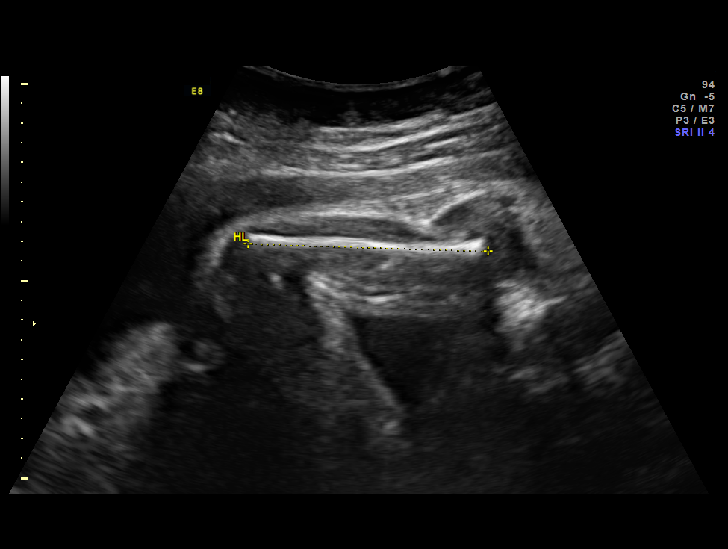
[im 24/33]
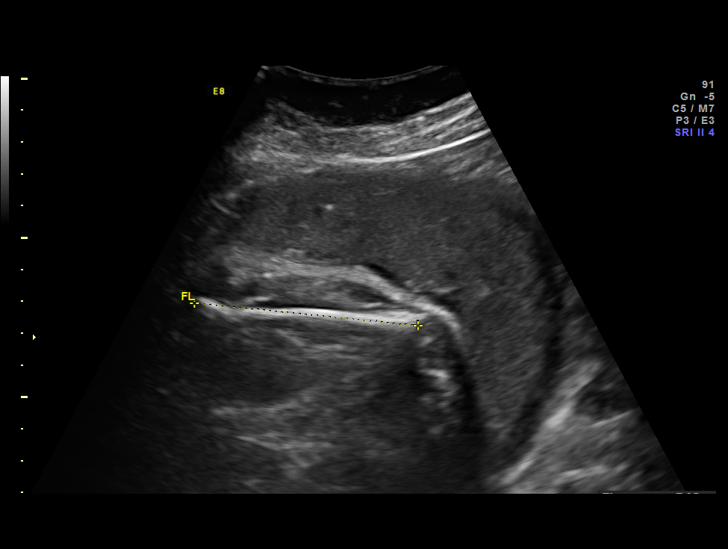
[im 27/33]
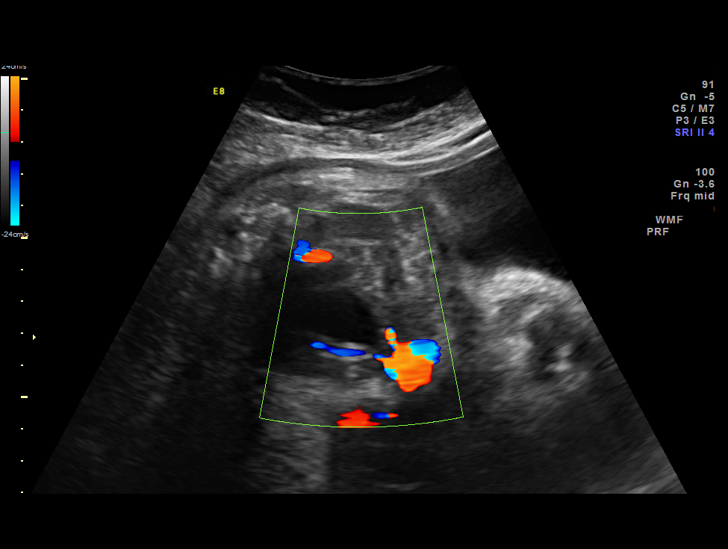
[im 29/33]
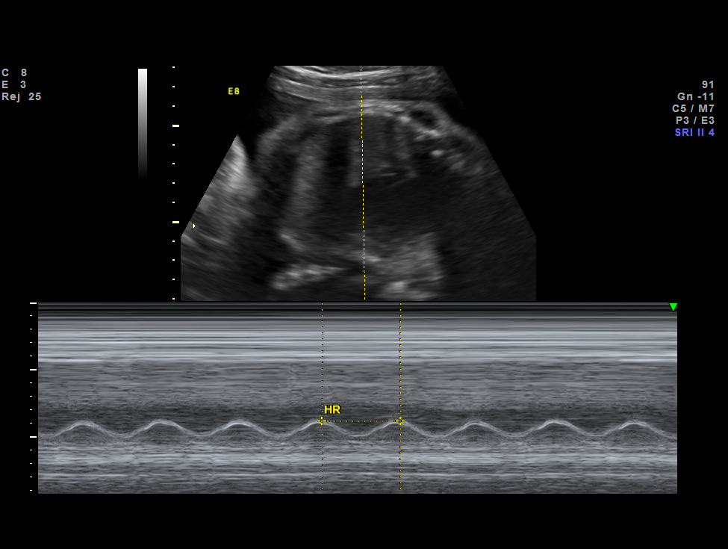
[im 31/33]
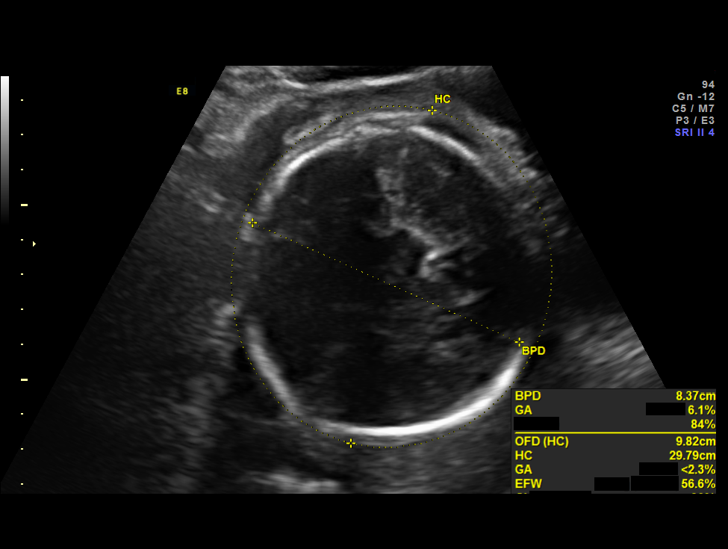

[13 of 28 positions shown; findings below may reference images not displayed]

1  ERICKJOSE NING             [id number]      [id number]     [id number]
Indications

36 weeks gestation of pregnancy
Poor obstetrical history (prior SGA baby)
OB History

Gravidity:    2         Term:   1        Prem:   0         SAB:   0
TOP:          0       Ectopic:  0        Living: 1
Fetal Evaluation

Num Of Fetuses:     1
Fetal Heart         129
Rate(bpm):
Cardiac Activity:   Observed
Presentation:       Cephalic
Placenta:           Posterior, above cervical os
P. Cord Insertion:  Previously Visualized

Amniotic Fluid
AFI FV:      Subjectively within normal limits
AFI Sum:     21.33    cm      81  %Tile      Larg Pckt:   5.72  cm
RUQ:   5.72    cm   RLQ:    5.59   cm    LUQ:   4.74    cm    LLQ:   5.28    cm
Biometry

BPD:      84.2  mm     G. Age:  33w 6d                  CI:          88.3  %    70 - 86
OFD:      95.4  mm                                      FL/HC:       24.5  %    20.1 -
HC:      291.8  mm     G. Age:  32w 1d        < 3  %    HC/AC:       0.91       0.93 -
AC:      320.8  mm     G. Age:  36w 0d         60  %    FL/BPD:      84.8  %    71 - 87
FL:       71.4  mm     G. Age:  36w 4d         60  %    FL/AC:       22.3  %    20 - 24
HUM:      61.4  mm     G. Age:  35w 4d         55  %
Est. FW:    7419   gm    5 lb 15 oz     53  %
Gestational Age

LMP:           37w 1d        Date:  04/08/15                 EDD:    01/13/16
U/S Today:     34w 5d                                        EDD:    01/30/16
Best:          36w 0d     Det. By:  Early Ultrasound         EDD:    01/21/16
(06/03/15)
Anatomy

Cranium:          Appears normal         Aortic Arch:      Previously seen
Fetal Cavum:      Previously seen        Ductal Arch:      Previously seen
Ventricles:       Appears normal         Diaphragm:        Appears normal
Choroid Plexus:   Previously seen        Stomach:          Appears normal, left
sided
Cerebellum:       Previously seen        Abdomen:          Appears normal
Posterior Fossa:  Previously seen        Abdominal Wall:   Previously seen
Nuchal Fold:      Previously seen        Cord Vessels:     Appears normal (3
vessel cord)
Face:             Orbits and profile     Kidneys:          Appear normal
previously seen
Lips:             Previously seen        Bladder:          Appears normal
Heart:            Previously seen        Spine:            Previously seen
RVOT:             Previously seen        Upper             Previously seen
Extremities:
LVOT:             Previously seen        Lower             Previously seen
Extremities:

Other:  Fetus appears to be a male. Heels prev seen
Impression

SIUP at 36+0 weeks
Cephalic presentation
Normal interval anatomy; anatomic survey complete
Normal amniotic fluid volume
Appropriate interval growth with EFW at the 53rd %tile
Recommendations

Follow-up as clinically indicated

## 2018-03-04 IMAGING — US US OB COMP LESS 14 WK
1 series · 15 of 28 positions shown · non-contrast
Comparison: 06/03/2015

CLINICAL DATA: Early pregnancy, RIGHT-side pain; quantitative beta
HCG = 99

EXAM:
OBSTETRIC <14 WK US AND TRANSVAGINAL OB US
TECHNIQUE: Both transabdominal and transvaginal ultrasound examinations were
performed for complete evaluation of the gestation as well as the
maternal uterus, adnexal regions, and pelvic cul-de-sac.
Transvaginal technique was performed to assess early pregnancy.

[Series 1: us ob comp less 14 wk · 50 acquisitions, 15 frames shown]
[im 1/50]
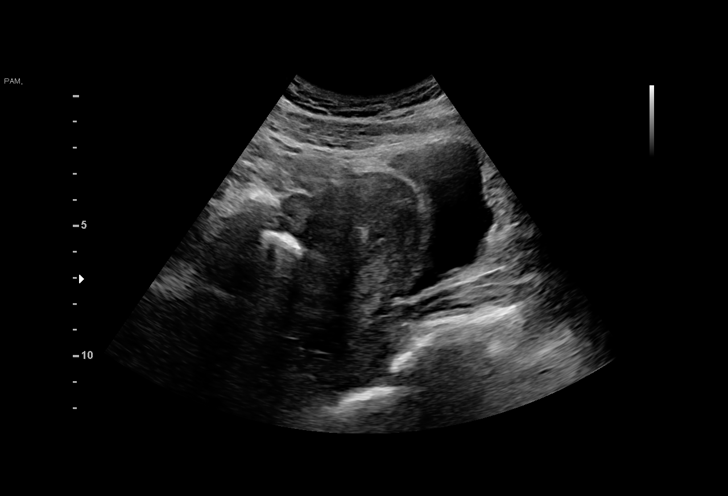
[im 4/50]
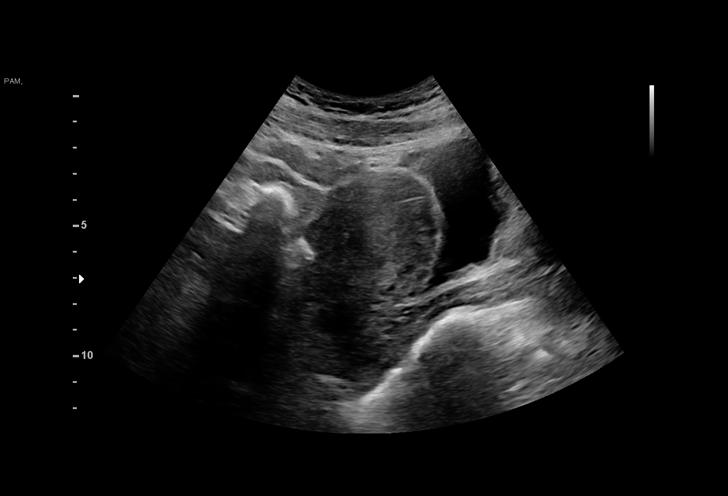
[im 8/50]
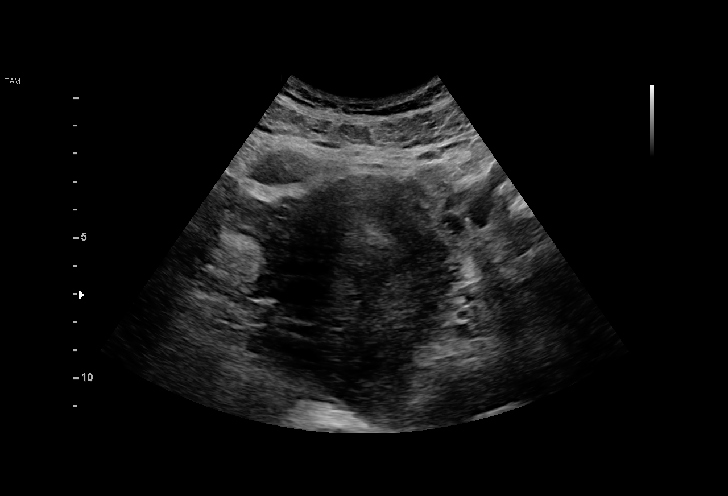
[im 11/50]
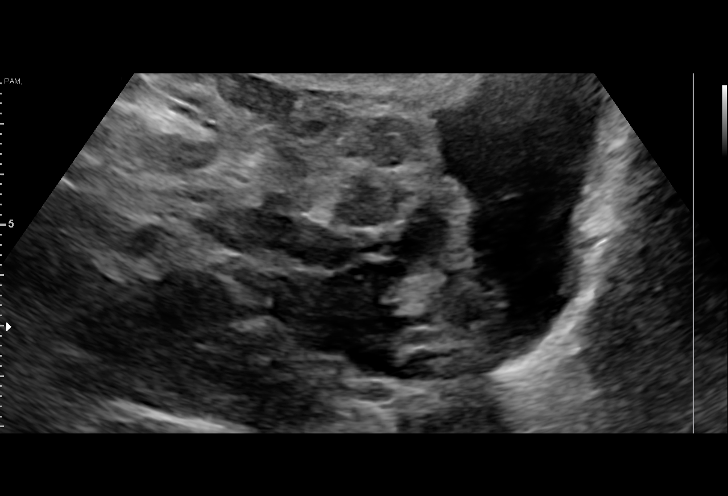
[im 15/50]
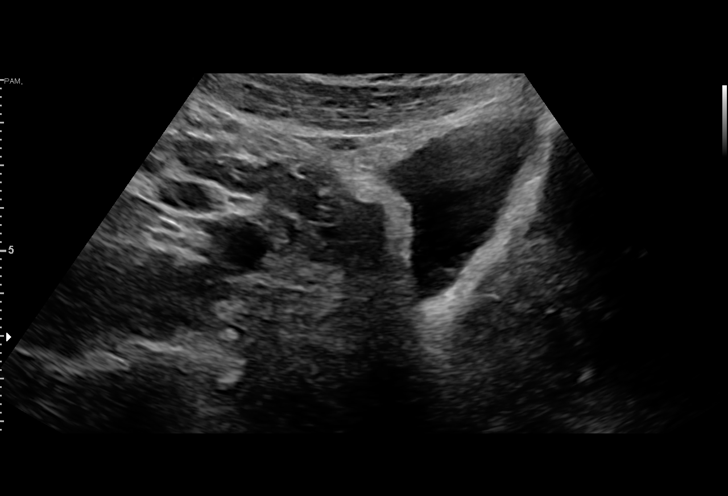
[im 19/50]
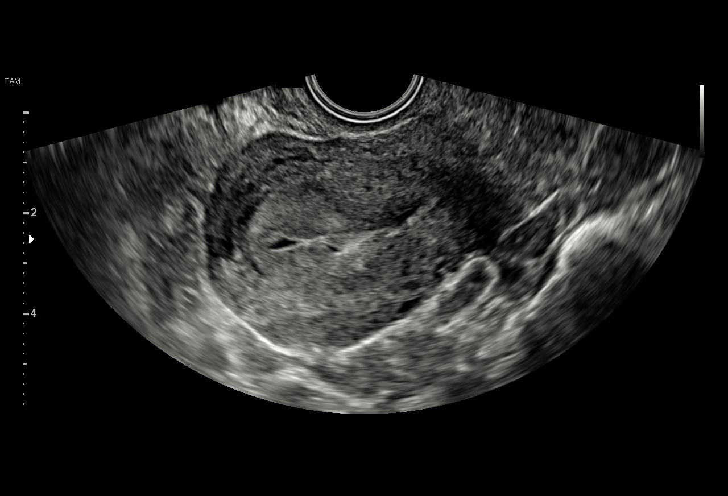
[im 22/50]
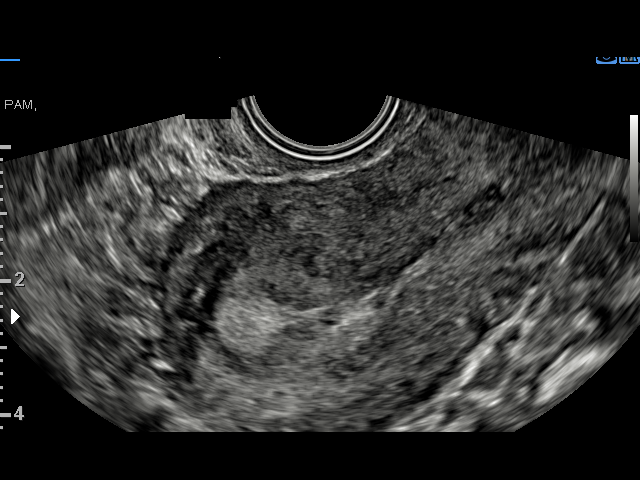
[im 26/50]
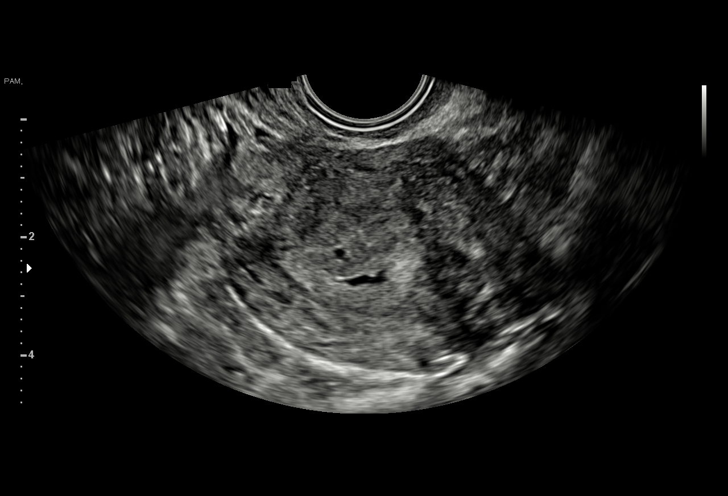
[im 28/50]
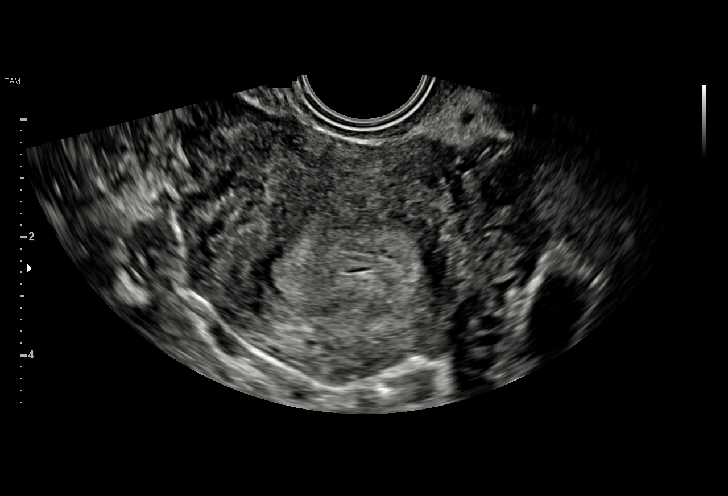
[im 31/50]
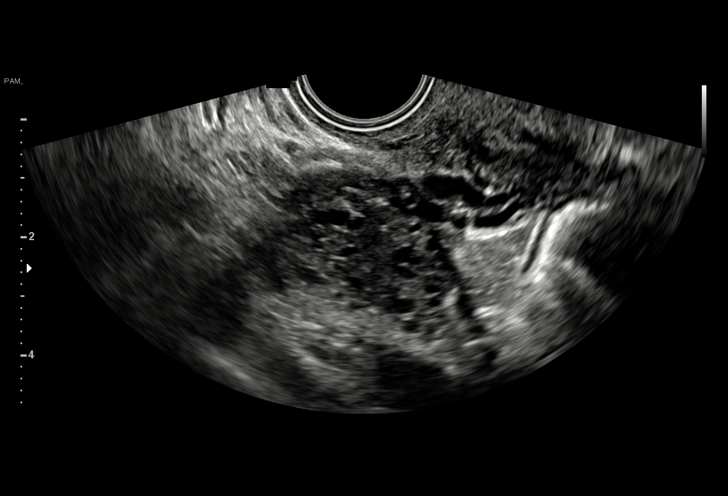
[im 35/50]
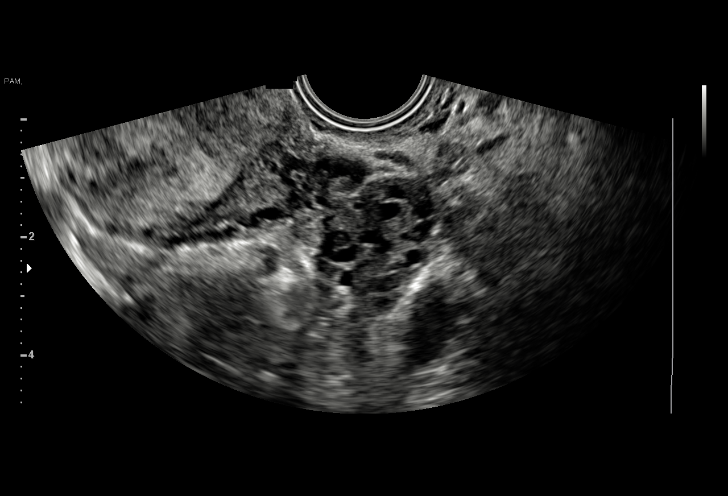
[im 39/50]
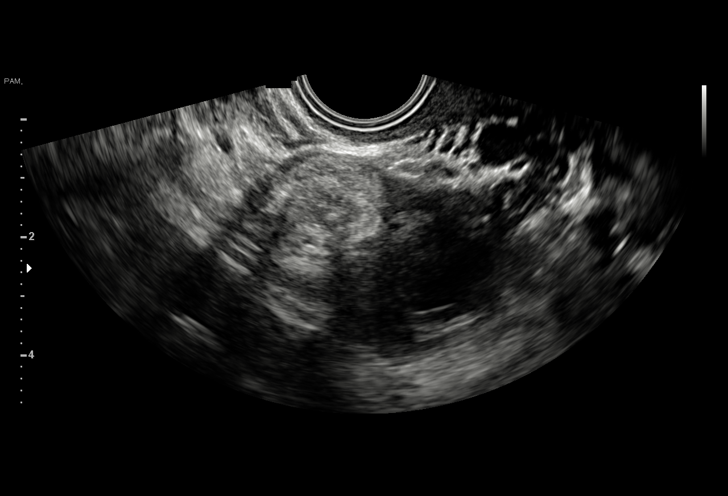
[im 42/50]
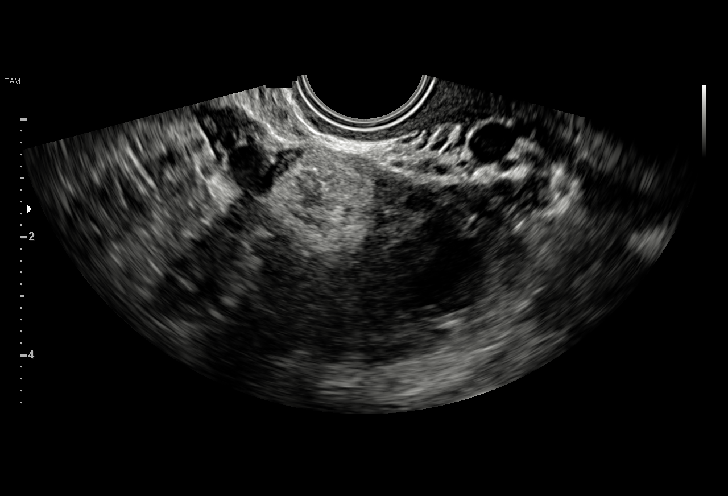
[im 46/50]
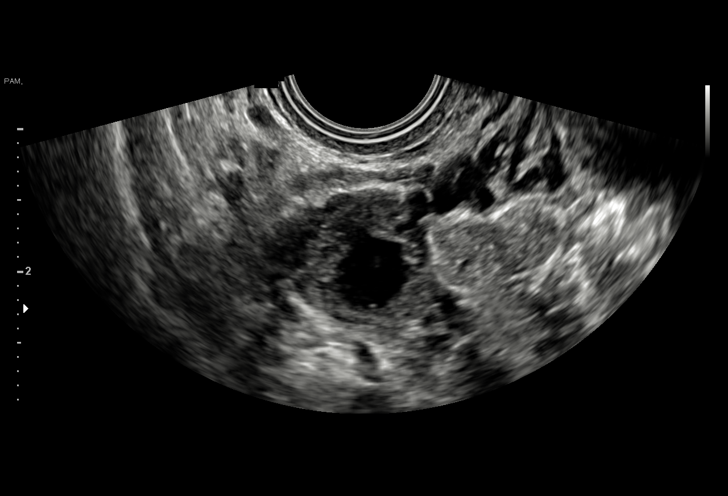
[im 50/50]
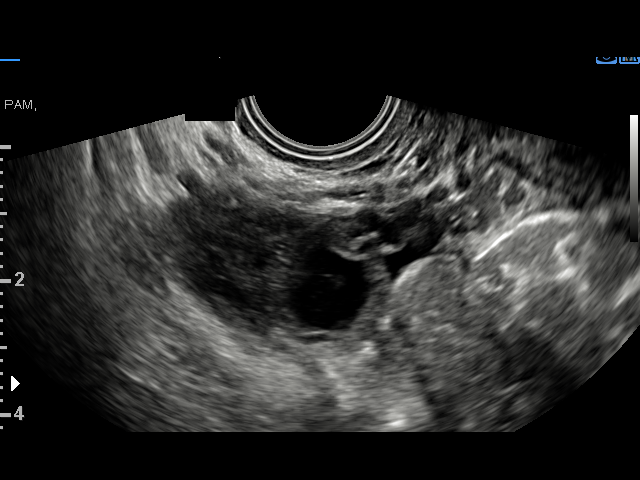

[15 of 28 positions shown; findings below may reference images not displayed]

FINDINGS: Intrauterine gestational sac: Questionably visualized, see below

Yolk sac:  N/A

Embryo:  N/A

Cardiac Activity: N/A

Heart Rate: N/A  bpm

Subchorionic hemorrhage:  N/A

Maternal uterus/adnexae:

Mildly thickened endometrial complex with minimal endometrial fluid.

Tiny focal fluid collection seen at the junction of the endometrium
and myometrium, cannot exclude tiny gestational sac, 5 x 2 x 3 mm.

Several tiny questionable fluid collections or cysts are seen

LEFT ovary normal size and morphology, 2.8 x 3.4 x 2.0 cm.

RIGHT ovary measures 4.6 x 3.2 x 3.0 cm and contains a small corpus
luteal cyst as well as a non shadowing hyperechoic nodule 2.1 x
x 2.4 cm likely a small dermoid tumor, seen on prior study.

Trace free pelvic fluid.
IMPRESSION: Questionable tiny gestational sac versus fluid collection or cyst at
the upper uterine segment at junction of the endometrium and
myometrium.

Consider followup ultrasound in 14 days to reassess if indicated.

Hyperechoic nodule RIGHT ovary 2.4 cm likely small dermoid tumor,
unchanged.

No other pelvic sonographic abnormalities identified.

## 2018-03-20 IMAGING — US US OB TRANSVAGINAL
1 series · 15 of 28 positions shown · non-contrast
Comparison: 08/23/2016

CLINICAL DATA: Follow-up viability

EXAM:
TRANSVAGINAL OB ULTRASOUND
TECHNIQUE: Transvaginal ultrasound was performed for complete evaluation of the
gestation as well as the maternal uterus, adnexal regions, and
pelvic cul-de-sac.

[Series 1: us ob transvaginal · 40 acquisitions, 15 frames shown]
[im 1/40]
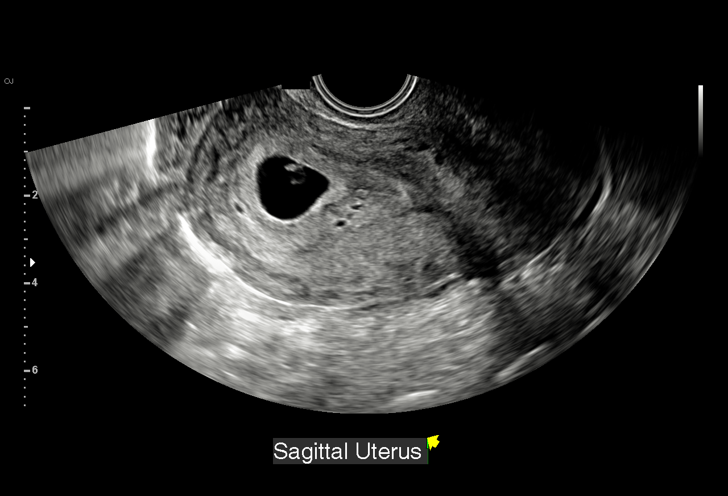
[im 3/40]
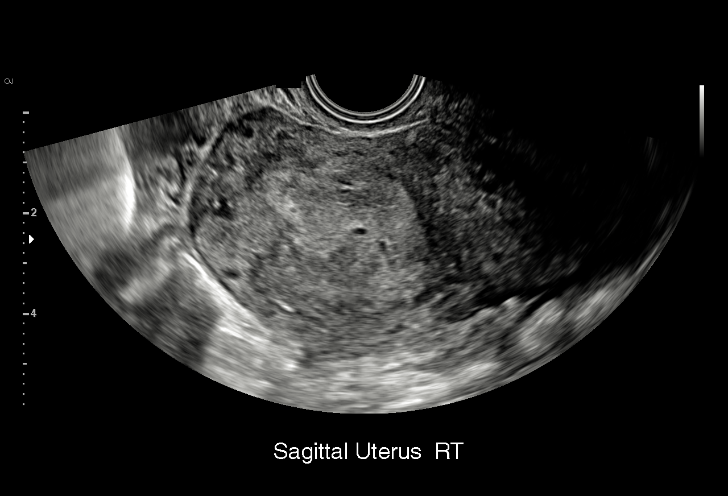
[im 6/40]
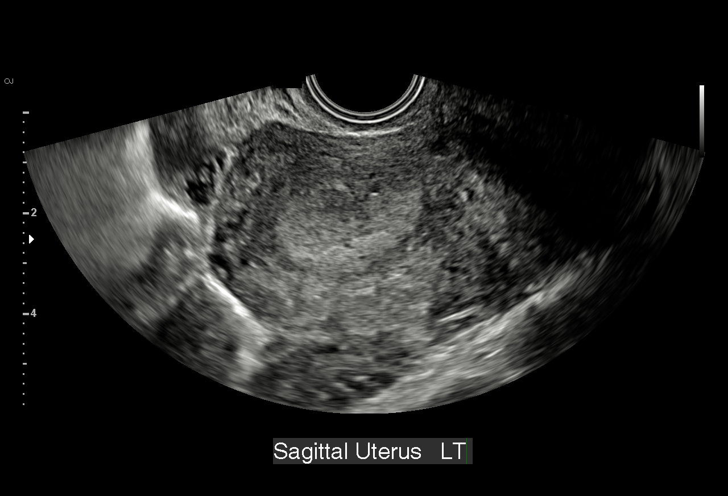
[im 9/40]
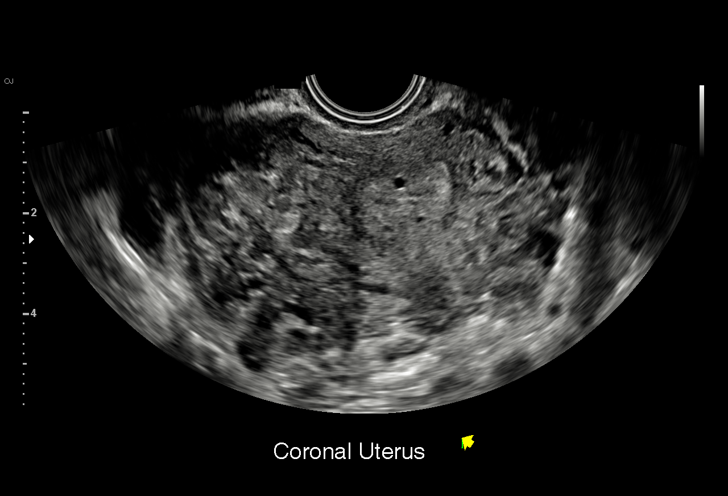
[im 12/40]
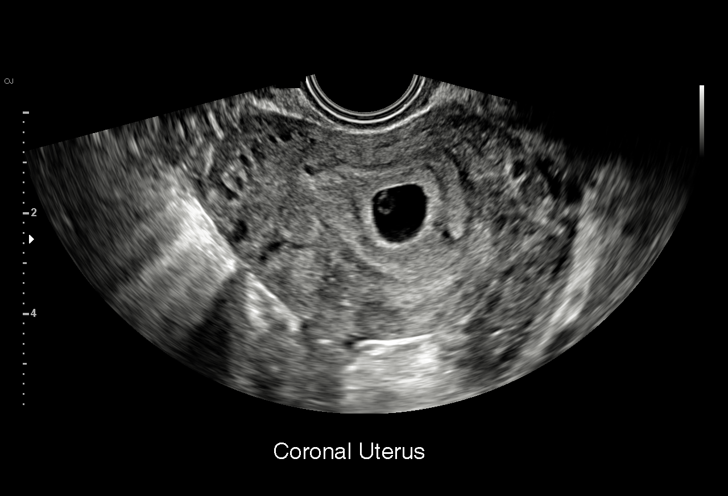
[im 15/40]
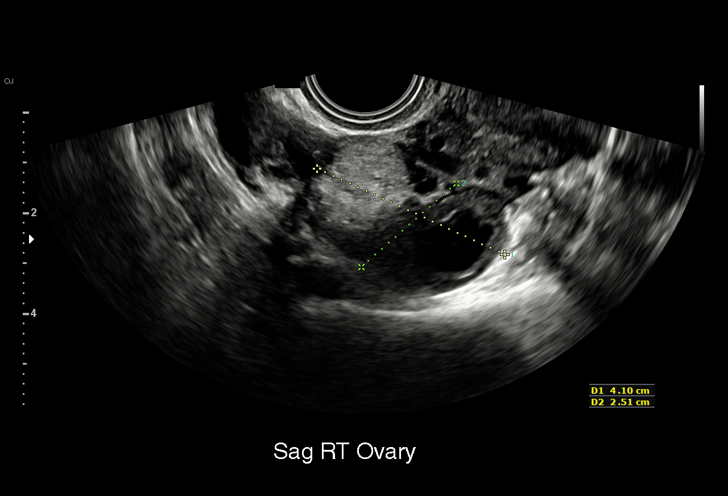
[im 18/40]
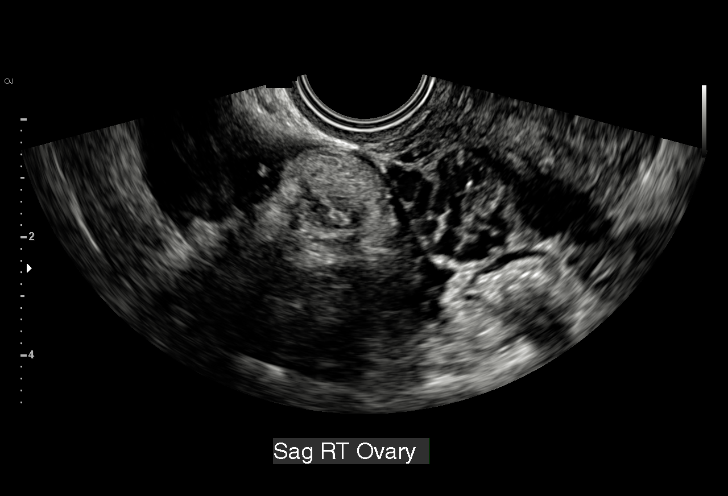
[im 21/40]
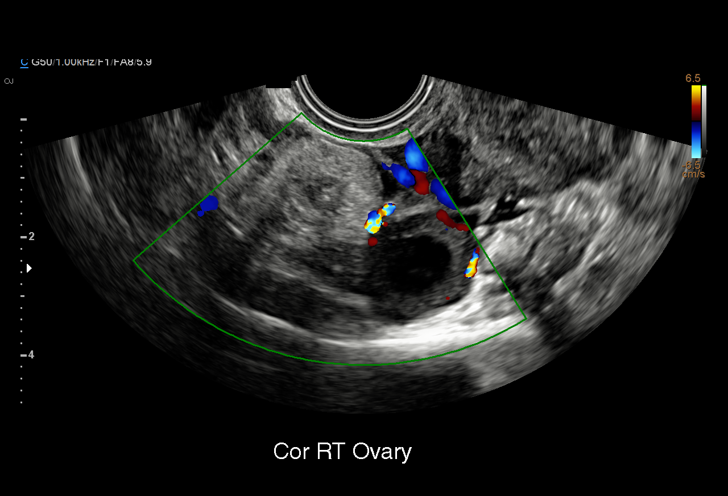
[im 22/40]
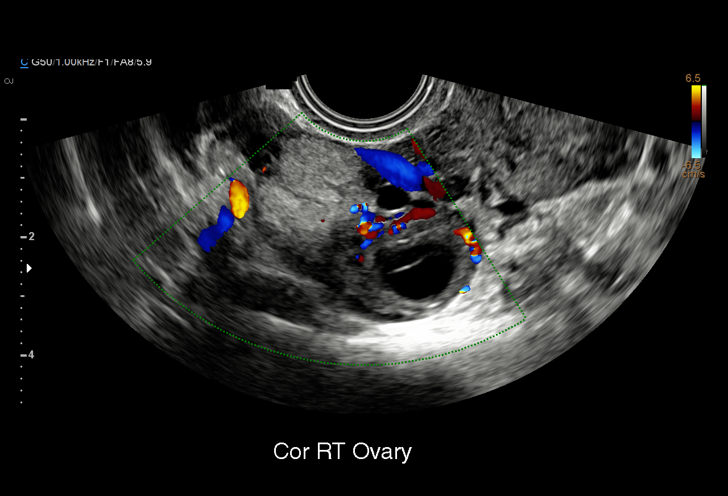
[im 25/40]
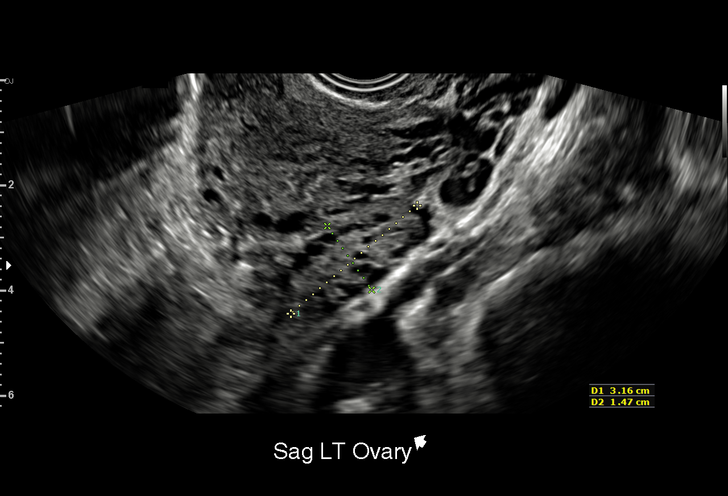
[im 28/40]
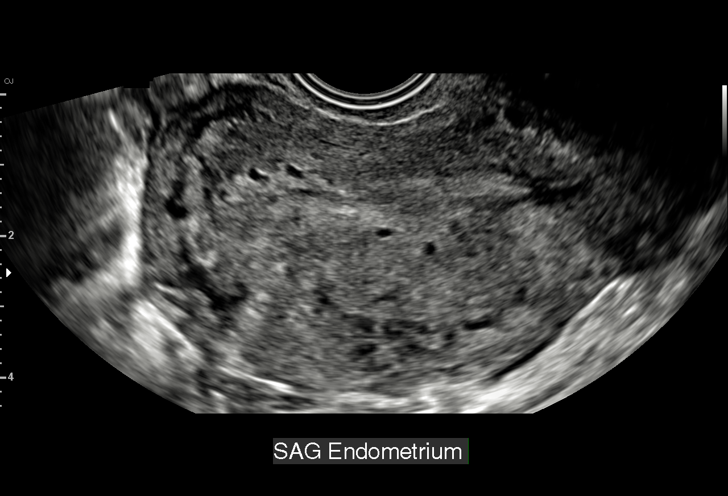
[im 31/40]
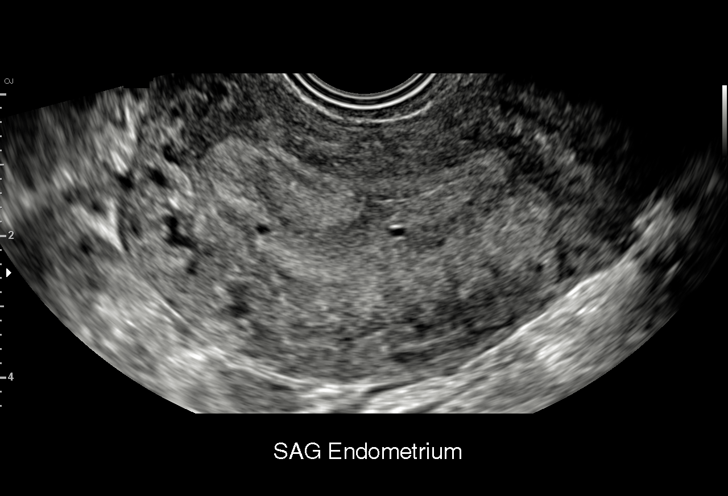
[im 34/40]
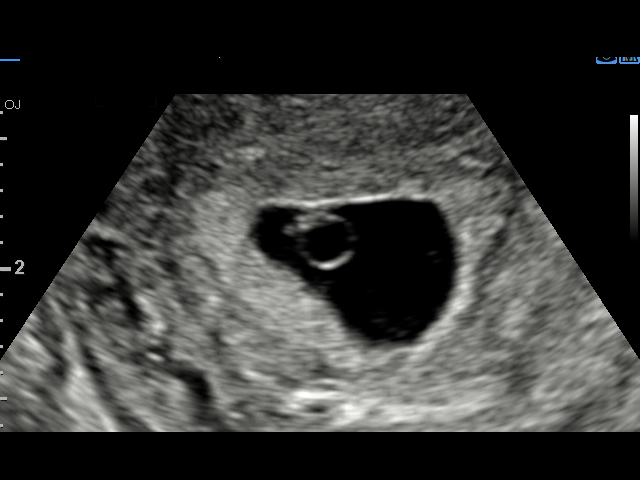
[im 37/40]
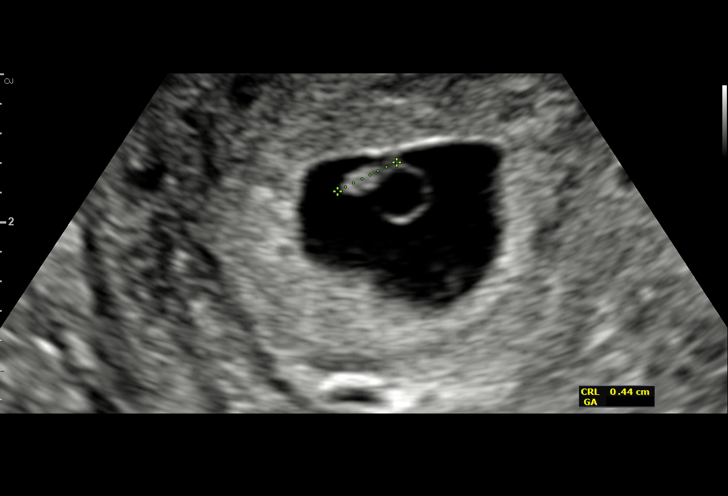
[im 40/40]
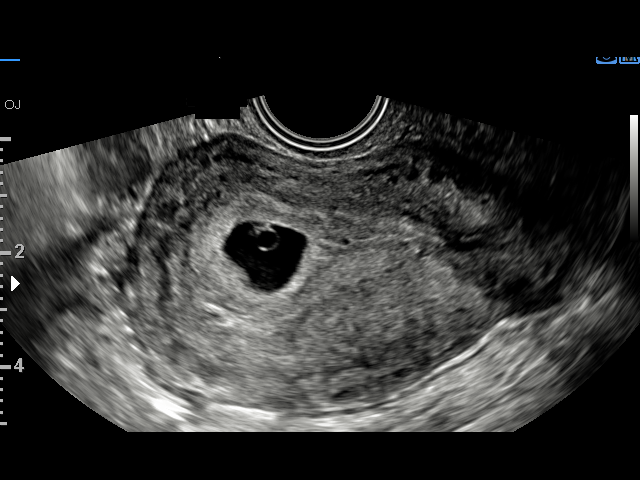

[15 of 28 positions shown; findings below may reference images not displayed]

FINDINGS: Intrauterine gestational sac: Single

Yolk sac:  Visualized

Embryo:  Visualized

Cardiac Activity: Visualized

Heart Rate: 111 bpm

MSD:   mm    w     d

CRL:   4.0  mm   6 w 1 d                  US EDC: 05/03/2017

Subchorionic hemorrhage:  None visualized.

Maternal uterus/adnexae: Hyperechoic area within the right ovary
measuring up to 2.5 cm most compatible with dermoid. No free fluid.
IMPRESSION: Six week 1 day intrauterine pregnancy. Fetal heart rate 111 beats
per minute. No acute maternal findings.

2.5 cm echogenic area within the right ovary most compatible with
dermoid.

## 2018-10-09 ENCOUNTER — Inpatient Hospital Stay (HOSPITAL_COMMUNITY): Payer: Managed Care, Other (non HMO)

## 2018-10-09 ENCOUNTER — Inpatient Hospital Stay (HOSPITAL_COMMUNITY)
Admission: AD | Admit: 2018-10-09 | Discharge: 2018-10-09 | Disposition: A | Discharge status: Home / Self Care | Payer: Managed Care, Other (non HMO) | Source: Ambulatory Visit | Attending: Obstetrics and Gynecology | Admitting: Obstetrics and Gynecology

## 2018-10-09 ENCOUNTER — Encounter (HOSPITAL_COMMUNITY): Payer: Self-pay

## 2018-10-09 DIAGNOSIS — O26891 Other specified pregnancy related conditions, first trimester: Secondary | ICD-10-CM

## 2018-10-09 DIAGNOSIS — R109 Unspecified abdominal pain: Secondary | ICD-10-CM

## 2018-10-09 DIAGNOSIS — Z3A01 Less than 8 weeks gestation of pregnancy: Secondary | ICD-10-CM | POA: Diagnosis not present

## 2018-10-09 DIAGNOSIS — O210 Mild hyperemesis gravidarum: Secondary | ICD-10-CM | POA: Diagnosis present

## 2018-10-09 DIAGNOSIS — Z3490 Encounter for supervision of normal pregnancy, unspecified, unspecified trimester: Secondary | ICD-10-CM

## 2018-10-09 LAB — CBC
HEMATOCRIT: 36.8 % (ref 36.0–46.0)
Hemoglobin: 11.9 g/dL — ABNORMAL LOW (ref 12.0–15.0)
MCH: 27.2 pg (ref 26.0–34.0)
MCHC: 32.3 g/dL (ref 30.0–36.0)
MCV: 84.2 fL (ref 80.0–100.0)
NRBC: 0 % (ref 0.0–0.2)
PLATELETS: 323 10*3/uL (ref 150–400)
RBC: 4.37 MIL/uL (ref 3.87–5.11)
RDW: 14.4 % (ref 11.5–15.5)
WBC: 10.6 10*3/uL — ABNORMAL HIGH (ref 4.0–10.5)

## 2018-10-09 LAB — HCG, QUANTITATIVE, PREGNANCY: hCG, Beta Chain, Quant, S: 27093 m[IU]/mL — ABNORMAL HIGH (ref <5)

## 2018-10-09 LAB — URINALYSIS, ROUTINE W REFLEX MICROSCOPIC
Bilirubin Urine: NEGATIVE
GLUCOSE, UA: NEGATIVE mg/dL
Hgb urine dipstick: NEGATIVE
Ketones, ur: 20 mg/dL — AB
NITRITE: NEGATIVE
PH: 6 (ref 5.0–8.0)
Protein, ur: NEGATIVE mg/dL
SPECIFIC GRAVITY, URINE: 1.028 (ref 1.005–1.030)

## 2018-10-09 LAB — POCT PREGNANCY, URINE: Preg Test, Ur: POSITIVE — AB

## 2018-10-09 MED ORDER — ALBUTEROL SULFATE HFA 108 (90 BASE) MCG/ACT IN AERS
2.0000 | INHALATION_SPRAY | Freq: Four times a day (QID) | RESPIRATORY_TRACT | 1 refills | Status: DC | PRN
Start: 2018-10-09 — End: 2020-08-30

## 2018-10-09 MED ORDER — M.V.I. ADULT IV INJ
Freq: Once | INTRAVENOUS | Status: AC
  Administered 2018-10-09: 13:00:00 via INTRAVENOUS
  Filled 2018-10-09: qty 1000

## 2018-10-09 MED ORDER — METOCLOPRAMIDE HCL 10 MG PO TABS: 10.0000 mg | ORAL_TABLET | Freq: Four times a day (QID) | ORAL | 0 refills | Status: DC

## 2018-10-09 MED ORDER — RANITIDINE HCL 150 MG PO TABS: 150.0000 mg | ORAL_TABLET | Freq: Two times a day (BID) | ORAL | 0 refills | Status: DC

## 2018-10-09 MED ORDER — SODIUM CHLORIDE 0.9 % IV SOLN
25.0000 mg | Freq: Once | INTRAVENOUS | Status: AC
  Administered 2018-10-09: 25 mg via INTRAVENOUS
  Filled 2018-10-09: qty 1

## 2018-10-09 MED ORDER — PROMETHAZINE HCL 25 MG PO TABS
25.0000 mg | ORAL_TABLET | Freq: Four times a day (QID) | ORAL | 1 refills | Status: DC | PRN
Start: 2018-10-09 — End: 2018-11-19

## 2018-10-09 NOTE — MAU Note (Signed)
N/V, "cant keep anything down", states there is blood in vomit  Having cramps, no vaginal bleeding  Emesis x8 in the past 24 hours  No diarrhea

## 2018-10-09 NOTE — Discharge Instructions (Signed)

## 2018-10-09 NOTE — MAU Provider Note (Addendum)
History     CSN: [id number]  Arrival date and time: 10/09/18 0865   First Provider Initiated Contact with Patient 10/09/18 1115      Chief Complaint  Patient presents with  . Abdominal Pain  . Nausea  . Emesis   HPI  Ms.  Jillian Bishop is a 22 y.o. year old G41P3003 female at [redacted]w[redacted]d weeks gestation who presents to MAU reporting N/V and being "unable to keep anything down," blood in vomit, and abdominal cramps. She last ate grilled chicken, mashed potatoes and broccoli last night about 1930. She "immediately threw it all up after eating." She denies diarrhea, VB or abnormal vaginal d/c. She starts [facility] with [facility] on 10/28/18.  Past Medical History:  Diagnosis Date  . Asthma   . HPV (human papilloma virus) infection     Past Surgical History:  Procedure Laterality Date  . APPENDECTOMY      Family History  Problem Relation Age of Onset  . Hypertension Mother   . Hypertension Father   . Diabetes Father   . Hypertension Sister   . Heart disease Paternal Grandmother   . Hypertension Paternal Grandmother     Social History   Tobacco Use  . Smoking status: Never Smoker  . Smokeless tobacco: Never Used  Substance Use Topics  . Alcohol use: No  . Drug use: No    Allergies: No Known Allergies  Medications Prior to Admission  Medication Sig Dispense Refill Last Dose  . ibuprofen (ADVIL,MOTRIN) 600 MG tablet Take 1 tablet (600 mg total) by mouth every 6 (six) hours. 30 tablet 0   . Prenatal Vit-Fe Fumarate-FA (PRENATAL MULTIVITAMIN) TABS tablet Take 1 tablet by mouth daily at 12 noon.   04/22/2017 at Unknown time  . [DISCONTINUED] albuterol (PROVENTIL HFA;VENTOLIN HFA) 108 (90 BASE) MCG/ACT inhaler Inhale 2 puffs into the lungs every 6 (six) hours as needed for wheezing or shortness of breath. Reported on 03/12/2016   Past Month at Unknown time    Review of Systems  Constitutional: Negative.   HENT: Negative.   Eyes: Negative.   Respiratory: Negative.    Cardiovascular: Negative.   Gastrointestinal: Positive for abdominal pain, nausea and vomiting. Negative for diarrhea.  Endocrine: Negative.   Genitourinary: Negative.   Musculoskeletal: Negative.   Skin: Negative.   Allergic/Immunologic: Negative.   Neurological: Negative.   Hematological: Negative.   Psychiatric/Behavioral: Negative.    Physical Exam   Blood pressure (!) 113/57, pulse 77, temperature 98.1 F (36.7 C), temperature source Oral, resp. rate 18, height 5\' 4"  (1.626 m), weight 85.2 kg, last menstrual period 08/23/2018.  Physical Exam  Nursing note and vitals reviewed. Constitutional: She is oriented to person, place, and time. She appears well-developed and well-nourished.  HENT:  Head: Normocephalic and atraumatic.  Eyes: Pupils are equal, round, and reactive to light.  Neck: Normal range of motion.  Cardiovascular: Normal rate, regular rhythm, normal heart sounds and intact distal pulses.  Respiratory: Effort normal and breath sounds normal.  GI: Soft. Bowel sounds are decreased.  Genitourinary:  Genitourinary Comments: Pelvic deferred  Musculoskeletal: Normal range of motion.  Neurological: She is alert and oriented to person, place, and time. She has normal reflexes.  Skin: Skin is warm and dry.  Psychiatric: She has a normal mood and affect. Her behavior is normal. Judgment and thought content normal.    MAU Course  Procedures  MDM CCUA UPT CBC HCG OB U/S < 14 wks w/TV: IUP with CRL measuring 5.6 wks,  FHR 122 bpm  IVF's Phenergan 25 mg in LR 1000 ml @ 999 ml/hr; then MVI in LR 1000 ml @ 500 ml/hr -- improved N/V PO challenge - tolerated well, no N/V  Results for orders placed or performed during the hospital encounter of 10/09/18 (from the past 24 hour(s))  Urinalysis, Routine w reflex microscopic     Status: Abnormal   Collection Time: 10/09/18 10:29 AM  Result Value Ref Range   Color, Urine YELLOW YELLOW   APPearance HAZY (A) CLEAR   Specific  Gravity, Urine 1.028 1.005 - 1.030   pH 6.0 5.0 - 8.0   Glucose, UA NEGATIVE NEGATIVE mg/dL   Hgb urine dipstick NEGATIVE NEGATIVE   Bilirubin Urine NEGATIVE NEGATIVE   Ketones, ur 20 (A) NEGATIVE mg/dL   Protein, ur NEGATIVE NEGATIVE mg/dL   Nitrite NEGATIVE NEGATIVE   Leukocytes, UA LARGE (A) NEGATIVE   RBC / HPF 0-5 0 - 5 RBC/hpf   WBC, UA 11-20 0 - 5 WBC/hpf   Bacteria, UA RARE (A) NONE SEEN   Squamous Epithelial / LPF 11-20 0 - 5   Mucus PRESENT   Pregnancy, urine POC     Status: Abnormal   Collection Time: 10/09/18 10:33 AM  Result Value Ref Range   Preg Test, Ur POSITIVE (A) NEGATIVE  CBC     Status: Abnormal   Collection Time: 10/09/18 11:43 AM  Result Value Ref Range   WBC 10.6 (H) 4.0 - 10.5 K/uL   RBC 4.37 3.87 - 5.11 MIL/uL   Hemoglobin 11.9 (L) 12.0 - 15.0 g/dL   HCT 36.8 36.0 - 46.0 %   MCV 84.2 80.0 - 100.0 fL   MCH 27.2 26.0 - 34.0 pg   MCHC 32.3 30.0 - 36.0 g/dL   RDW 14.4 11.5 - 15.5 %   Platelets 323 150 - 400 K/uL   nRBC 0.0 0.0 - 0.2 %  hCG, quantitative, pregnancy     Status: Abnormal   Collection Time: 10/09/18 11:43 AM  Result Value Ref Range   hCG, Beta Chain, Quant, S 27,093 (H) <5 mIU/mL   US Ob Less Than 14 Weeks With Ob Transvaginal  Result Date: 10/09/2018 CLINICAL DATA:  Abdominal pain. Estimated gestational age of [redacted] weeks, 5 days by LMP. EXAM: OBSTETRIC <14 WK [place] AND TRANSVAGINAL OB US TECHNIQUE: Both transabdominal and transvaginal ultrasound examinations were performed for complete evaluation of the gestation as well as the maternal uterus, adnexal regions, and pelvic cul-de-sac. Transvaginal technique was performed to assess early pregnancy. COMPARISON:  Pelvic ultrasound dated September 08, 2016. FINDINGS: Intrauterine gestational sac: Single Yolk sac:  Visualized. Embryo:  Visualized. Cardiac Activity: Visualized. Heart Rate: 122 bpm CRL:  3.3 mm   5 w   6 d                 [place] EDC: 06/05/2019 Subchorionic hemorrhage:  None visualized. Maternal  uterus/adnexae: Unremarkable. IMPRESSION: 1. Single, live intrauterine pregnancy with estimated gestational age of [redacted] weeks, 6 days. No acute abnormality. S<D, EDC changed to 06/04/2018. Electronically Signed   By: Titus Dubin M.D.   On: 10/09/2018 14:36    Assessment and Plan  Morning sickness - Plan: Discharge patient - Rx for Phenergan 25 mg hs, Reglan 10 mg every 6 hrs during daytime & Zantac 150 mg BID - Information provided on morning sickness  Abdominal pain during pregnancy in first trimester - Information provided on abd pain in pregnancy   Intrauterine pregnancy  - Keep scheduled NOB  appt with [facility]  - Discharge patient - Patient verbalized an understanding of the plan of care and agrees.   Laury Deep, MSN, CNM 10/09/2018, 11:22 AM

## 2018-10-12 ENCOUNTER — Encounter (HOSPITAL_COMMUNITY): Payer: Self-pay

## 2018-10-12 ENCOUNTER — Inpatient Hospital Stay (HOSPITAL_COMMUNITY)
Admission: AD | Admit: 2018-10-12 | Discharge: 2018-10-12 | Disposition: A | Discharge status: Home / Self Care | Payer: Managed Care, Other (non HMO) | Source: Ambulatory Visit | Attending: Obstetrics and Gynecology | Admitting: Obstetrics and Gynecology

## 2018-10-12 DIAGNOSIS — O210 Mild hyperemesis gravidarum: Secondary | ICD-10-CM | POA: Insufficient documentation

## 2018-10-12 DIAGNOSIS — O219 Vomiting of pregnancy, unspecified: Secondary | ICD-10-CM | POA: Diagnosis not present

## 2018-10-12 DIAGNOSIS — Z3490 Encounter for supervision of normal pregnancy, unspecified, unspecified trimester: Secondary | ICD-10-CM

## 2018-10-12 DIAGNOSIS — Z3A01 Less than 8 weeks gestation of pregnancy: Secondary | ICD-10-CM | POA: Diagnosis not present

## 2018-10-12 LAB — URINALYSIS, ROUTINE W REFLEX MICROSCOPIC
BACTERIA UA: NONE SEEN
BILIRUBIN URINE: NEGATIVE
Glucose, UA: NEGATIVE mg/dL
HGB URINE DIPSTICK: NEGATIVE
Ketones, ur: 80 mg/dL — AB
Leukocytes, UA: NEGATIVE
NITRITE: NEGATIVE
PROTEIN: 30 mg/dL — AB
SPECIFIC GRAVITY, URINE: 1.032 — AB (ref 1.005–1.030)
pH: 5 (ref 5.0–8.0)

## 2018-10-12 MED ORDER — PROMETHAZINE HCL 25 MG/ML IJ SOLN
25.0000 mg | Freq: Once | INTRAVENOUS | Status: AC
  Administered 2018-10-12: 25 mg via INTRAVENOUS
  Filled 2018-10-12: qty 1

## 2018-10-12 MED ORDER — ONDANSETRON 8 MG PO TBDP: 8.0000 mg | ORAL_TABLET | Freq: Three times a day (TID) | ORAL | 1 refills | Status: DC | PRN

## 2018-10-12 NOTE — Discharge Instructions (Signed)

## 2018-10-12 NOTE — MAU Note (Signed)
Pt has had n/v even with meds. Called the office and was sent here.

## 2018-10-12 NOTE — MAU Provider Note (Signed)
History     CSN: [id number]  Arrival date and time: 10/12/18 1412   First Provider Initiated Contact with Patient 10/12/18 1535      Chief Complaint  Patient presents with  . Emesis  . Nausea   Jillian Bishop is a 22 y.o. [phone] at [redacted]w[redacted]d who presents today with nausea and vomiting. She was seen for this 3 days ago. She states that she has been taking phenergan and reglan, but this is not helping. She last took phenergan last night before bed, and took reglan today around 1145.   Emesis   This is a new problem. The current episode started in the past 7 days. The problem occurs more than 10 times per day. The problem has been unchanged. The emesis has an appearance of stomach contents. There has been no fever. Pertinent negatives include no chills, diarrhea or fever. Risk factors: pregnancy  Treatments tried: phenergan last night, and reglan around 1145 today  The treatment provided no relief.    OB History    Gravida  4   Para  3   Term  3   Preterm      AB      Living  3     SAB      TAB      Ectopic      Multiple  0   Live Births  3           Past Medical History:  Diagnosis Date  . Asthma   . HPV (human papilloma virus) infection     Past Surgical History:  Procedure Laterality Date  . APPENDECTOMY      Family History  Problem Relation Age of Onset  . Hypertension Mother   . Hypertension Father   . Diabetes Father   . Hypertension Sister   . Heart disease Paternal Grandmother   . Hypertension Paternal Grandmother     Social History   Tobacco Use  . Smoking status: Never Smoker  . Smokeless tobacco: Never Used  Substance Use Topics  . Alcohol use: No  . Drug use: No    Allergies: No Known Allergies  Medications Prior to Admission  Medication Sig Dispense Refill Last Dose  . metoCLOPramide (REGLAN) 10 MG tablet Take 1 tablet (10 mg total) by mouth every 6 (six) hours. Take during the day 30 tablet 0 10/12/2018 at Unknown time  .  Prenatal Vit-Fe Fumarate-FA (PRENATAL MULTIVITAMIN) TABS tablet Take 1 tablet by mouth daily at 12 noon.   10/12/2018 at Unknown time  . promethazine (PHENERGAN) 25 MG tablet Take 1 tablet (25 mg total) by mouth every 6 (six) hours as needed for nausea or vomiting. Take at bedtime (Patient taking differently: Take 25 mg by mouth at bedtime. Take at bedtime) 30 tablet 1 10/11/2018 at Unknown time  . albuterol (PROVENTIL HFA;VENTOLIN HFA) 108 (90 Base) MCG/ACT inhaler Inhale 2 puffs into the lungs every 6 (six) hours as needed for wheezing or shortness of breath. Reported on 03/12/2016 1 Inhaler 1 emergncy  . ranitidine (ZANTAC) 150 MG tablet Take 1 tablet (150 mg total) by mouth 2 (two) times daily. (Patient not taking: Reported on 10/12/2018) 60 tablet 0 Not Taking at Unknown time    Review of Systems  Constitutional: Negative for chills and fever.  Gastrointestinal: Positive for constipation (last BM 11/1/9) and vomiting. Negative for diarrhea.  Genitourinary: Negative for dysuria, pelvic pain, vaginal bleeding and vaginal discharge.   Physical Exam   Blood pressure Marland Kitchen)  114/50, pulse 74, temperature 98.1 F (36.7 C), temperature source Oral, resp. rate 16, weight 84.4 kg, last menstrual period 08/23/2018, SpO2 99 %, unknown if currently breastfeeding.  Physical Exam  Nursing note and vitals reviewed. Constitutional: She is oriented to person, place, and time. She appears well-developed and well-nourished. No distress.  HENT:  Head: Normocephalic.  Cardiovascular: Normal rate.  Respiratory: Effort normal.  GI: Soft. There is no tenderness. There is no rebound.  Neurological: She is alert and oriented to person, place, and time.  Skin: Skin is warm and dry.  Psychiatric: She has a normal mood and affect.   Results for orders placed or performed during the hospital encounter of 10/12/18 (from the past 24 hour(s))  Urinalysis, Routine w reflex microscopic     Status: Abnormal   Collection Time:  10/12/18  3:14 PM  Result Value Ref Range   Color, Urine YELLOW YELLOW   APPearance HAZY (A) CLEAR   Specific Gravity, Urine 1.032 (H) 1.005 - 1.030   pH 5.0 5.0 - 8.0   Glucose, UA NEGATIVE NEGATIVE mg/dL   Hgb urine dipstick NEGATIVE NEGATIVE   Bilirubin Urine NEGATIVE NEGATIVE   Ketones, ur 80 (A) NEGATIVE mg/dL   Protein, ur 30 (A) NEGATIVE mg/dL   Nitrite NEGATIVE NEGATIVE   Leukocytes, UA NEGATIVE NEGATIVE   RBC / HPF 0-5 0 - 5 RBC/hpf   WBC, UA 0-5 0 - 5 WBC/hpf   Bacteria, UA NONE SEEN NONE SEEN   Squamous Epithelial / LPF 0-5 0 - 5   Mucus PRESENT      MAU Course  Procedures  MDM Patient has had 1L of LR with 25mg  phenergan. She is tolerating PO. She has not had any emesis since arrival here.   Assessment and Plan   1. Nausea/vomiting in pregnancy   2. Intrauterine pregnancy   3. Morning sickness   4. [redacted] weeks gestation of pregnancy    DC home Comfort measures reviewed  1st Trimester precautions RX: zofran PRN #[id number]  Return to MAU as needed FU with OB as planned  Follow-up Information    [facility] Follow up.   Contact information: [address]  [address] [place] [address] [phone]            Marcille Buffy 10/12/2018, 3:39 PM

## 2018-11-19 ENCOUNTER — Inpatient Hospital Stay (HOSPITAL_COMMUNITY): Payer: Managed Care, Other (non HMO) | Admitting: Anesthesiology

## 2018-11-19 ENCOUNTER — Encounter (HOSPITAL_COMMUNITY): Payer: Self-pay

## 2018-11-19 ENCOUNTER — Ambulatory Visit (HOSPITAL_COMMUNITY)
Admission: AD | Admit: 2018-11-19 | Discharge: 2018-11-19 | Disposition: A | Discharge status: Home / Self Care | Payer: Managed Care, Other (non HMO) | Attending: Obstetrics and Gynecology | Admitting: Obstetrics and Gynecology

## 2018-11-19 ENCOUNTER — Encounter (HOSPITAL_COMMUNITY)
Admission: AD | Disposition: A | Discharge status: Home / Self Care | Payer: Self-pay | Source: Home / Self Care | Attending: Obstetrics and Gynecology

## 2018-11-19 DIAGNOSIS — O021 Missed abortion: Secondary | ICD-10-CM | POA: Diagnosis not present

## 2018-11-19 DIAGNOSIS — J45909 Unspecified asthma, uncomplicated: Secondary | ICD-10-CM | POA: Diagnosis not present

## 2018-11-19 DIAGNOSIS — Z79899 Other long term (current) drug therapy: Secondary | ICD-10-CM | POA: Diagnosis not present

## 2018-11-19 HISTORY — PX: DILATION AND EVACUATION: SHX1459

## 2018-11-19 LAB — CBC
HEMATOCRIT: 37.8 % (ref 36.0–46.0)
Hemoglobin: 12.4 g/dL (ref 12.0–15.0)
MCH: 27.4 pg (ref 26.0–34.0)
MCHC: 32.8 g/dL (ref 30.0–36.0)
MCV: 83.4 fL (ref 80.0–100.0)
NRBC: 0 % (ref 0.0–0.2)
Platelets: 323 10*3/uL (ref 150–400)
RBC: 4.53 MIL/uL (ref 3.87–5.11)
RDW: 14.1 % (ref 11.5–15.5)
WBC: 7.4 10*3/uL (ref 4.0–10.5)

## 2018-11-19 LAB — TYPE AND SCREEN
ABO/RH(D): O POS
ANTIBODY SCREEN: NEGATIVE

## 2018-11-19 LAB — RPR: RPR Ser Ql: NONREACTIVE

## 2018-11-19 SURGERY — DILATION AND EVACUATION, UTERUS
Anesthesia: General | Site: Vagina

## 2018-11-19 MED ORDER — OXYCODONE HCL 5 MG/5ML PO SOLN: 5.0000 mg | Freq: Once | ORAL | Status: DC | PRN

## 2018-11-19 MED ORDER — ONDANSETRON HCL 4 MG/2ML IJ SOLN
INTRAMUSCULAR | Status: DC | PRN
  Administered 2018-11-19: 4 mg via INTRAVENOUS

## 2018-11-19 MED ORDER — ACETAMINOPHEN 325 MG PO TABS: 325.0000 mg | ORAL_TABLET | ORAL | Status: DC | PRN

## 2018-11-19 MED ORDER — ONDANSETRON HCL 4 MG/2ML IJ SOLN
INTRAMUSCULAR | Status: AC
  Filled 2018-11-19: qty 2

## 2018-11-19 MED ORDER — MIDAZOLAM HCL 2 MG/2ML IJ SOLN
INTRAMUSCULAR | Status: DC | PRN
  Administered 2018-11-19: 2 mg via INTRAVENOUS

## 2018-11-19 MED ORDER — FENTANYL CITRATE (PF) 100 MCG/2ML IJ SOLN
INTRAMUSCULAR | Status: AC
  Filled 2018-11-19: qty 2

## 2018-11-19 MED ORDER — ONDANSETRON HCL 4 MG/2ML IJ SOLN: 4.0000 mg | Freq: Once | INTRAMUSCULAR | Status: DC | PRN

## 2018-11-19 MED ORDER — OXYCODONE HCL 5 MG PO TABS: 5.0000 mg | ORAL_TABLET | Freq: Once | ORAL | Status: DC | PRN

## 2018-11-19 MED ORDER — LIDOCAINE HCL 1 % IJ SOLN
INTRAMUSCULAR | Status: AC
  Filled 2018-11-19: qty 20

## 2018-11-19 MED ORDER — FENTANYL CITRATE (PF) 100 MCG/2ML IJ SOLN: 25.0000 ug | INTRAMUSCULAR | Status: DC | PRN

## 2018-11-19 MED ORDER — DEXAMETHASONE SODIUM PHOSPHATE 4 MG/ML IJ SOLN
INTRAMUSCULAR | Status: AC
  Filled 2018-11-19: qty 1

## 2018-11-19 MED ORDER — DEXAMETHASONE SODIUM PHOSPHATE 10 MG/ML IJ SOLN
INTRAMUSCULAR | Status: DC | PRN
  Administered 2018-11-19: 4 mg via INTRAVENOUS

## 2018-11-19 MED ORDER — SCOPOLAMINE 1 MG/3DAYS TD PT72
MEDICATED_PATCH | TRANSDERMAL | Status: AC
  Filled 2018-11-19: qty 1

## 2018-11-19 MED ORDER — PROPOFOL 10 MG/ML IV BOLUS
INTRAVENOUS | Status: AC
  Filled 2018-11-19: qty 20

## 2018-11-19 MED ORDER — SCOPOLAMINE 1 MG/3DAYS TD PT72
MEDICATED_PATCH | TRANSDERMAL | Status: DC | PRN
  Administered 2018-11-19: 1 via TRANSDERMAL

## 2018-11-19 MED ORDER — FENTANYL CITRATE (PF) 100 MCG/2ML IJ SOLN
INTRAMUSCULAR | Status: DC | PRN
  Administered 2018-11-19 (×2): 50 ug via INTRAVENOUS

## 2018-11-19 MED ORDER — MIDAZOLAM HCL 2 MG/2ML IJ SOLN
INTRAMUSCULAR | Status: AC
  Filled 2018-11-19: qty 2

## 2018-11-19 MED ORDER — LACTATED RINGERS IV SOLN
INTRAVENOUS | Status: DC | PRN
  Administered 2018-11-19: 10:00:00 via INTRAVENOUS

## 2018-11-19 MED ORDER — ACETAMINOPHEN 160 MG/5ML PO SOLN: 325.0000 mg | ORAL | Status: DC | PRN

## 2018-11-19 MED ORDER — LIDOCAINE HCL (CARDIAC) PF 100 MG/5ML IV SOSY
PREFILLED_SYRINGE | INTRAVENOUS | Status: DC | PRN
  Administered 2018-11-19: 50 mg via INTRAVENOUS

## 2018-11-19 MED ORDER — LACTATED RINGERS IV BOLUS
1000.0000 mL | Freq: Once | INTRAVENOUS | Status: AC
  Administered 2018-11-19: 1000 mL via INTRAVENOUS

## 2018-11-19 MED ORDER — LIDOCAINE HCL 1 % IJ SOLN
INTRAMUSCULAR | Status: DC | PRN
  Administered 2018-11-19: 20 mL

## 2018-11-19 MED ORDER — LACTATED RINGERS IV SOLN
INTRAVENOUS | Status: DC
  Administered 2018-11-19: 09:00:00 via INTRAVENOUS

## 2018-11-19 MED ORDER — PROPOFOL 10 MG/ML IV BOLUS
INTRAVENOUS | Status: DC | PRN
  Administered 2018-11-19: 200 mg via INTRAVENOUS
  Administered 2018-11-19: 30 mg via INTRAVENOUS

## 2018-11-19 MED ORDER — LIDOCAINE HCL (CARDIAC) PF 100 MG/5ML IV SOSY
PREFILLED_SYRINGE | INTRAVENOUS | Status: AC
  Filled 2018-11-19: qty 5

## 2018-11-19 MED ORDER — FAMOTIDINE IN NACL 20-0.9 MG/50ML-% IV SOLN
20.0000 mg | Freq: Once | INTRAVENOUS | Status: AC
  Administered 2018-11-19: 20 mg via INTRAVENOUS
  Filled 2018-11-19: qty 50

## 2018-11-19 MED ORDER — MEPERIDINE HCL 25 MG/ML IJ SOLN: 6.2500 mg | INTRAMUSCULAR | Status: DC | PRN

## 2018-11-19 SURGICAL SUPPLY — 19 items
CATH ROBINSON RED A/P 16FR (CATHETERS) ×3 IMPLANT
DECANTER SPIKE VIAL GLASS SM (MISCELLANEOUS) ×3 IMPLANT
GLOVE BIO SURGEON STRL SZ 6.5 (GLOVE) ×2 IMPLANT
GLOVE BIO SURGEONS STRL SZ 6.5 (GLOVE) ×1
GLOVE BIOGEL PI IND STRL 7.0 (GLOVE) ×1 IMPLANT
GLOVE BIOGEL PI INDICATOR 7.0 (GLOVE) ×2
GOWN STRL REUS W/TWL LRG LVL3 (GOWN DISPOSABLE) ×6 IMPLANT
HIBICLENS CHG 4% 4OZ BTL (MISCELLANEOUS) ×3 IMPLANT
KIT BERKELEY 1ST TRIMESTER 3/8 (MISCELLANEOUS) ×3 IMPLANT
NS IRRIG 1000ML POUR BTL (IV SOLUTION) ×3 IMPLANT
PACK VAGINAL MINOR WOMEN LF (CUSTOM PROCEDURE TRAY) ×3 IMPLANT
PAD OB MATERNITY 4.3X12.25 (PERSONAL CARE ITEMS) ×3 IMPLANT
PAD PREP 24X48 CUFFED NSTRL (MISCELLANEOUS) ×3 IMPLANT
SET BERKELEY SUCTION TUBING (SUCTIONS) ×3 IMPLANT
TOWEL OR 17X24 6PK STRL BLUE (TOWEL DISPOSABLE) ×6 IMPLANT
VACURETTE 10 RIGID CVD (CANNULA) ×3 IMPLANT
VACURETTE 7MM CVD STRL WRAP (CANNULA) IMPLANT
VACURETTE 8 RIGID CVD (CANNULA) IMPLANT
VACURETTE 9 RIGID CVD (CANNULA) IMPLANT

## 2018-11-19 NOTE — Anesthesia Preprocedure Evaluation (Signed)
Anesthesia Evaluation  Patient identified by MRN, date of birth, ID band Patient awake    Reviewed: Allergy & Precautions, H&P , NPO status , Patient's Chart, lab work & pertinent test results, reviewed documented beta blocker date and time   Airway Mallampati: II  TM Distance: >3 FB Neck ROM: full    Dental no notable dental hx. (+) Teeth Intact   Pulmonary neg pulmonary ROS,    Pulmonary exam normal breath sounds clear to auscultation       Cardiovascular Exercise Tolerance: Good negative cardio ROS   Rhythm:regular Rate:Normal     Neuro/Psych negative neurological ROS  negative psych ROS   GI/Hepatic negative GI ROS, Neg liver ROS,   Endo/Other  negative endocrine ROS  Renal/GU negative Renal ROS  negative genitourinary   Musculoskeletal   Abdominal   Peds  Hematology negative hematology ROS (+)   Anesthesia Other Findings   Reproductive/Obstetrics negative OB ROS                             Anesthesia Physical Anesthesia Plan  ASA: II and emergent  Anesthesia Plan: General   Post-op Pain Management:    Induction: Intravenous  PONV Risk Score and Plan: 3 and Ondansetron, Treatment may vary due to age or medical condition, Dexamethasone and Midazolam  Airway Management Planned: LMA  Additional Equipment:   Intra-op Plan:   Post-operative Plan: Extubation in OR  Informed Consent: I have reviewed the patients History and Physical, chart, labs and discussed the procedure including the risks, benefits and alternatives for the proposed anesthesia with the patient or authorized representative who has indicated his/her understanding and acceptance.   Dental Advisory Given  Plan Discussed with: CRNA, Anesthesiologist and Surgeon  Anesthesia Plan Comments: ( )        Anesthesia Quick Evaluation

## 2018-11-19 NOTE — MAU Provider Note (Signed)
  History    CSN: [id number]  Arrival date and time: 11/19/18 8099   None     Chief Complaint  Patient presents with  . D&C   HPI Pt is a 21 y/o G5P3003 who presents with a known missed miscarriage for suction D&E today. Pt came in for her routine nuchal translucency screen at 12 weeks and was found to have a fetal demise measuring 10+ weeks.  She denies and bleeding or pain.    NSVD x 3 at term  Past Medical History:  Diagnosis Date  . Asthma   . HPV (human papilloma virus) infection     Past Surgical History:  Procedure Laterality Date  . APPENDECTOMY      Family History  Problem Relation Age of Onset  . Hypertension Mother   . Hypertension Father   . Diabetes Father   . Hypertension Sister   . Heart disease Paternal Grandmother   . Hypertension Paternal Grandmother     Social History   Tobacco Use  . Smoking status: Never Smoker  . Smokeless tobacco: Never Used  Substance Use Topics  . Alcohol use: No  . Drug use: No    Allergies: No Known Allergies  Medications Prior to Admission  Medication Sig Dispense Refill Last Dose  . albuterol (PROVENTIL HFA;VENTOLIN HFA) 108 (90 Base) MCG/ACT inhaler Inhale 2 puffs into the lungs every 6 (six) hours as needed for wheezing or shortness of breath. Reported on 03/12/2016 1 Inhaler 1 emergncy  . metoCLOPramide (REGLAN) 10 MG tablet Take 1 tablet (10 mg total) by mouth every 6 (six) hours. Take during the day 30 tablet 0 10/12/2018 at Unknown time  . ondansetron (ZOFRAN ODT) 8 MG disintegrating tablet Take 1 tablet (8 mg total) by mouth every 8 (eight) hours as needed for nausea or vomiting. 20 tablet 1   . Prenatal Vit-Fe Fumarate-FA (PRENATAL MULTIVITAMIN) TABS tablet Take 1 tablet by mouth daily at 12 noon.   10/12/2018 at Unknown time  . promethazine (PHENERGAN) 25 MG tablet Take 1 tablet (25 mg total) by mouth every 6 (six) hours as needed for nausea or vomiting. Take at bedtime (Patient taking differently: Take 25 mg  by mouth at bedtime. Take at bedtime) 30 tablet 1 10/11/2018 at Unknown time  . ranitidine (ZANTAC) 150 MG tablet Take 1 tablet (150 mg total) by mouth 2 (two) times daily. (Patient not taking: Reported on 10/12/2018) 60 tablet 0 Not Taking at Unknown time    Review of Systems Physical Exam   Blood pressure (!) 110/56, pulse 87, temperature 98.4 F (36.9 C), temperature source Oral, resp. rate 17, height 5\' 4"  (1.626 m), weight 81.2 kg, last menstrual period 08/23/2018, SpO2 99 %, unknown if currently breastfeeding.  Physical Exam  Constitutional: She appears well-developed.  Cardiovascular: Normal rate and regular rhythm.  Respiratory: Effort normal and breath sounds normal.  GI: Soft.  Genitourinary:    Vagina normal.   Musculoskeletal: Normal range of motion.  Neurological: She is alert.  Psychiatric: She has a normal mood and affect.    MAU Course  Procedures    Assessment and Plan  Pt counseled on risks and benefits of D&C including bleeding, infection and possible uterine perforation.  She placed cytotec 460mcg 2-3 hours ago to help reduce that risk.  Blood type is O positive  Logan Bores 11/19/2018, 8:35 AM

## 2018-11-19 NOTE — MAU Note (Signed)
Jillian Bishop is a 22 y.o. at [redacted]w[redacted]d here in MAU reporting: for D&C  Pain score: denies NPO status: last night at 7pm Vitals:   11/19/18 0809  BP: (!) 110/56  Pulse: 87  Resp: 17  Temp: 98.4 F (36.9 C)  SpO2: 99%      Lab orders placed from triage: none

## 2018-11-19 NOTE — Transfer of Care (Signed)
Immediate Anesthesia Transfer of Care Note  Patient: Jillian Bishop  Procedure(s) Performed: DILATATION AND EVACUATION (N/A Vagina )  Patient Location: PACU  Anesthesia Type:General  Level of Consciousness: awake, alert  and patient cooperative  Airway & Oxygen Therapy: Patient Spontanous Breathing and Patient connected to nasal cannula oxygen  Post-op Assessment: Report given to RN, Post -op Vital signs reviewed and stable and Patient moving all extremities X 4  Post vital signs: Reviewed and stable  Last Vitals:  Vitals Value Taken Time  BP 119/69 11/19/2018 10:16 AM  Temp    Pulse 92 11/19/2018 10:17 AM  Resp 21 11/19/2018 10:17 AM  SpO2 100 % 11/19/2018 10:17 AM  Vitals shown include unvalidated device data.  Last Pain:  Vitals:   11/19/18 0810  TempSrc:   PainSc: 0-No pain         Complications: No apparent anesthesia complications

## 2018-11-19 NOTE — Op Note (Signed)
Operative Note    Preoperative Diagnosis Missed miscarriage at 12 weeks  Postoperative Diagnosis same  Procedure Suction Dilation and Evacuation  Surgeon Paula Compton, MD  Anesthesia General  Fluids: EBL 72mL UOP 73mL straight cath prior to procedure IVF 841mL  Findings Moderate amount of POC's  Specimen Products of conception  Procedure Note Pt was taken to operating room where LMA anesthesia was obtained without difficulty.  She was prepped and draped in the normal sterile fashion in the dorsal lithotomy position.  An appropriate timeout was performed.  A speculum was placed in the vagina and 2cc of 1% plain lidocaine injected into the anterior cervix.  An additional 9cc was placed both at 2 and 10 o'clock for a paracervical block.  A single tooth tenaculum was placed on the anterior lip of the cervix. The cervix was slightly dilated from the pt's cytotec treatment and  sound was easily introduced into the uterus and the uterus sounded to about 10 cm.  The 10 mm suction currette was obtained and introduced into the fundus.  With several passes, a moderate amount of tissue was obtained.  When no further tissue was noted, the suction was discontinued and a gentle currettage performed.  One additional pass revealed no further tissue, and the procedure was completed.  The tenaculum was removed and noted to be  hemostasic.  All instruments and sponges were removed from vagina and the patient awakened and taken to the PACU in good condition.

## 2018-11-19 NOTE — Anesthesia Postprocedure Evaluation (Signed)
Anesthesia Post Note  Patient: Jillian Bishop  Procedure(s) Performed: DILATATION AND EVACUATION (N/A Vagina )     Patient location during evaluation: PACU Anesthesia Type: General Level of consciousness: awake and alert Pain management: pain level controlled Vital Signs Assessment: post-procedure vital signs reviewed and stable Respiratory status: spontaneous breathing, nonlabored ventilation, respiratory function stable and patient connected to nasal cannula oxygen Cardiovascular status: blood pressure returned to baseline and stable Postop Assessment: no apparent nausea or vomiting Anesthetic complications: no    Last Vitals:  Vitals:   11/19/18 1100 11/19/18 1115  BP: 111/73 120/77  Pulse: 76 80  Resp: 15 16  Temp: 36.7 C   SpO2: 100% 100%    Last Pain:  Vitals:   11/19/18 1115  TempSrc:   PainSc: 0-No pain   Pain Goal:                 [name]

## 2018-11-19 NOTE — Anesthesia Procedure Notes (Signed)
Procedure Name: LMA Insertion Performed by: Sandrea Matte, CRNA Pre-anesthesia Checklist: Patient identified, Emergency Drugs available, Suction available and Patient being monitored Patient Re-evaluated:Patient Re-evaluated prior to induction Oxygen Delivery Method: Circle system utilized Preoxygenation: Pre-oxygenation with 100% oxygen Induction Type: IV induction LMA: LMA inserted LMA Size: 4.0 Number of attempts: 1 Airway Equipment and Method: Bite block Placement Confirmation: positive ETCO2 Dental Injury: Teeth and Oropharynx as per pre-operative assessment

## 2018-11-20 ENCOUNTER — Encounter (HOSPITAL_COMMUNITY): Payer: Self-pay | Admitting: Obstetrics and Gynecology

## 2019-07-13 DIAGNOSIS — Z3490 Encounter for supervision of normal pregnancy, unspecified, unspecified trimester: Secondary | ICD-10-CM | POA: Insufficient documentation

## 2019-08-03 LAB — OB RESULTS CONSOLE RPR: RPR: NONREACTIVE

## 2019-08-03 LAB — OB RESULTS CONSOLE HEPATITIS B SURFACE ANTIGEN: Hepatitis B Surface Ag: NEGATIVE

## 2019-08-03 LAB — OB RESULTS CONSOLE ABO/RH: RH Type: POSITIVE

## 2019-08-03 LAB — OB RESULTS CONSOLE ANTIBODY SCREEN: Antibody Screen: NEGATIVE

## 2019-08-03 LAB — OB RESULTS CONSOLE GC/CHLAMYDIA
Chlamydia: NEGATIVE
Gonorrhea: NEGATIVE

## 2019-08-03 LAB — OB RESULTS CONSOLE HIV ANTIBODY (ROUTINE TESTING): HIV: NONREACTIVE

## 2019-08-03 LAB — OB RESULTS CONSOLE RUBELLA ANTIBODY, IGM: Rubella: IMMUNE

## 2019-08-28 ENCOUNTER — Encounter (HOSPITAL_COMMUNITY): Payer: Self-pay

## 2019-10-28 ENCOUNTER — Other Ambulatory Visit: Payer: Self-pay

## 2019-10-28 DIAGNOSIS — Z20822 Contact with and (suspected) exposure to covid-19: Secondary | ICD-10-CM

## 2019-10-30 LAB — NOVEL CORONAVIRUS, NAA: SARS-CoV-2, NAA: NOT DETECTED

## 2019-11-01 ENCOUNTER — Other Ambulatory Visit: Payer: Self-pay

## 2019-11-01 DIAGNOSIS — Z20822 Contact with and (suspected) exposure to covid-19: Secondary | ICD-10-CM

## 2019-11-02 DIAGNOSIS — U071 COVID-19: Secondary | ICD-10-CM

## 2019-11-02 HISTORY — DX: COVID-19: U07.1

## 2019-11-02 LAB — NOVEL CORONAVIRUS, NAA: SARS-CoV-2, NAA: DETECTED — AB

## 2019-11-03 ENCOUNTER — Telehealth: Payer: Self-pay | Admitting: Unknown Physician Specialty

## 2019-11-03 NOTE — Telephone Encounter (Signed)
No answer

## 2019-11-04 ENCOUNTER — Ambulatory Visit: Payer: Self-pay

## 2019-11-04 NOTE — Telephone Encounter (Signed)
Pt given Covid-19 positive results. Discussed mild, moderate and severe symptoms. Advised pt to call 911 for any respiratory issues and/dehydration. Discussed non test criteria for ending self isolation. Pt advised of way to manage symptoms at home and review isolation precautions especially the importance of washing hands frequently and wearing a mask when around others. Pt verbalized understanding.  

## 2019-11-07 ENCOUNTER — Telehealth: Payer: Self-pay | Admitting: General Practice

## 2019-11-07 NOTE — Telephone Encounter (Signed)
Pt called back to report that she has missed calls from triage regarding her results. She already spoke to Judson Roch on 11/04/2019

## 2019-12-08 NOTE — L&D Delivery Note (Signed)
Delivery Note Pt labored quickly to 6-7 then to a rim. She pushed intermittently from 1115am and at 11:53 AM a viable female was delivered via Vaginal, Spontaneous (Presentation: ROA ). Shoulders delivered with mcroberts maneuver due to posr maternal pushing effort. No dystocia.  APGAR: 8 ,9 ; weight  pending.   Placenta status: delivered after 41mins, shultz, intact ,  .  Cord: 3vc  with the following complications: nuchal x1 .  Cord pH: n/a  Anesthesia: None Episiotomy:  none Lacerations:  none Suture Repair: n/a Est. Blood Loss (mL):    Mom to postpartum.  Baby to Couplet care / Skin to Skin  Baby girl [place].  Isaiah Serge 03/11/2020, 12:11 PM

## 2020-01-12 ENCOUNTER — Ambulatory Visit: Payer: Medicaid Other | Attending: Internal Medicine

## 2020-01-12 DIAGNOSIS — Z20822 Contact with and (suspected) exposure to covid-19: Secondary | ICD-10-CM

## 2020-01-14 LAB — NOVEL CORONAVIRUS, NAA: SARS-CoV-2, NAA: NOT DETECTED

## 2020-01-16 ENCOUNTER — Inpatient Hospital Stay (HOSPITAL_COMMUNITY)
Admission: AD | Admit: 2020-01-16 | Discharge: 2020-01-17 | Disposition: A | Discharge status: Home / Self Care | Payer: Medicaid Other | Attending: Obstetrics and Gynecology | Admitting: Obstetrics and Gynecology

## 2020-01-16 ENCOUNTER — Other Ambulatory Visit: Payer: Self-pay

## 2020-01-16 ENCOUNTER — Encounter (HOSPITAL_COMMUNITY): Payer: Self-pay | Admitting: Obstetrics and Gynecology

## 2020-01-16 DIAGNOSIS — H539 Unspecified visual disturbance: Secondary | ICD-10-CM

## 2020-01-16 DIAGNOSIS — Z3A31 31 weeks gestation of pregnancy: Secondary | ICD-10-CM | POA: Insufficient documentation

## 2020-01-16 DIAGNOSIS — Z8249 Family history of ischemic heart disease and other diseases of the circulatory system: Secondary | ICD-10-CM | POA: Insufficient documentation

## 2020-01-16 DIAGNOSIS — R42 Dizziness and giddiness: Secondary | ICD-10-CM

## 2020-01-16 DIAGNOSIS — R102 Pelvic and perineal pain: Secondary | ICD-10-CM | POA: Insufficient documentation

## 2020-01-16 DIAGNOSIS — O368130 Decreased fetal movements, third trimester, not applicable or unspecified: Secondary | ICD-10-CM | POA: Insufficient documentation

## 2020-01-16 DIAGNOSIS — O99513 Diseases of the respiratory system complicating pregnancy, third trimester: Secondary | ICD-10-CM | POA: Insufficient documentation

## 2020-01-16 DIAGNOSIS — O163 Unspecified maternal hypertension, third trimester: Secondary | ICD-10-CM | POA: Insufficient documentation

## 2020-01-16 DIAGNOSIS — O99891 Other specified diseases and conditions complicating pregnancy: Secondary | ICD-10-CM | POA: Insufficient documentation

## 2020-01-16 DIAGNOSIS — J45909 Unspecified asthma, uncomplicated: Secondary | ICD-10-CM | POA: Insufficient documentation

## 2020-01-16 DIAGNOSIS — Z833 Family history of diabetes mellitus: Secondary | ICD-10-CM | POA: Insufficient documentation

## 2020-01-16 LAB — URINALYSIS, ROUTINE W REFLEX MICROSCOPIC
Bilirubin Urine: NEGATIVE
Glucose, UA: NEGATIVE mg/dL
Hgb urine dipstick: NEGATIVE
Ketones, ur: NEGATIVE mg/dL
Nitrite: NEGATIVE
Protein, ur: NEGATIVE mg/dL
Specific Gravity, Urine: 1.017 (ref 1.005–1.030)
pH: 6 (ref 5.0–8.0)

## 2020-01-16 NOTE — MAU Note (Signed)
Patient reports to MAU c/o seeing white spots in her vision and being dizzy. Pt reports she purchased a BP cuff today and at home her BP was 161/95 she states she is unsure of how accurate it is though. Pt reports feeling pelvic pressure. NO FM since last night. No bleeding LOF or ctx. Pt denies a HA or RUQ pain.

## 2020-01-16 NOTE — MAU Provider Note (Signed)
Chief Complaint:  Pelvic Pain and Blurred Vision   First Provider Initiated Contact with Patient 01/16/20 2256     HPI: Jillian Bishop is a 24 y.o. G5P3003 at 75w5dwho presents to maternity admissions reporting hypertension at home.  Got a new wrist BP machine, not sure if it is accurate.   Also has been dizzy today and sees white spots when walking around.  Doesn't see spots at rest.  Feels some pelvic pressure at times this week.  Denies feeling contractions. States hasnt felt baby move tonight.  . She denies LOF, vaginal bleeding, vaginal itching/burning, urinary symptoms, h/a, n/v, diarrhea, constipation or fever/chills.  She denies headache, or RUQ abdominal pain.  Dizziness This is a new problem. The current episode started today. The problem occurs intermittently. Associated symptoms include a visual change. Pertinent negatives include no abdominal pain (just pelvic pressure), chest pain, chills, fever, myalgias, nausea, neck pain or vomiting. The symptoms are aggravated by walking. She has tried nothing for the symptoms.  Hypertension This is a new problem. The current episode started today. The problem is unchanged. Pertinent negatives include no blurred vision, chest pain, malaise/fatigue, neck pain, peripheral edema or shortness of breath. There are no associated agents to hypertension. There are no known risk factors for coronary artery disease. Past treatments include nothing. There are no compliance problems.    RN note: Patient reports to MAU c/o seeing white spots in her vision and being dizzy. Pt reports she purchased a BP cuff today and at home her BP was 161/95 she states she is unsure of how accurate it is though. Pt reports feeling pelvic pressure. NO FM since last night. No bleeding LOF or ctx. Pt denies a HA or RUQ pain.   Past Medical History: Past Medical History:  Diagnosis Date  . Asthma   . HPV (human papilloma virus) infection     Past obstetric history: OB History   Gravida Para Term Preterm AB Living  5 3 3   1 3   SAB TAB Ectopic Multiple Live Births  1     0 3    # Outcome Date GA Lbr Len/2nd Weight Sex Delivery Anes PTL Lv  5 Current           4 SAB 11/09/18 106w2d   U      3 Term 04/23/17 [redacted]w[redacted]d 03:22 / 00:22 2948 g F Vag-Spont None  LIV  2 Term 01/20/16 [redacted]w[redacted]d  3177 g M Vag-Spont None  LIV  1 Term 10/03/10 [redacted]w[redacted]d  2381 g M Vag-Spont None N LIV    Past Surgical History: Past Surgical History:  Procedure Laterality Date  . APPENDECTOMY    . DILATION AND EVACUATION N/A 11/19/2018   Procedure: DILATATION AND EVACUATION;  Surgeon: Paula Compton, MD;  Location: [facility];  Service: Gynecology;  Laterality: N/A;    Family History: Family History  Problem Relation Age of Onset  . Hypertension Mother   . Hypertension Father   . Diabetes Father   . Hypertension Sister   . Heart disease Paternal Grandmother   . Hypertension Paternal Grandmother     Social History: Social History   Tobacco Use  . Smoking status: Never Smoker  . Smokeless tobacco: Never Used  Substance Use Topics  . Alcohol use: No  . Drug use: No    Allergies: No Known Allergies  Meds:  No medications prior to admission.    I have reviewed patient's Past Medical Hx, Surgical Hx, Family Hx, Social Hx,  medications and allergies.   ROS:  Review of Systems  Constitutional: Negative for chills, fever and malaise/fatigue.  Eyes: Negative for blurred vision.  Respiratory: Negative for shortness of breath.   Cardiovascular: Negative for chest pain.  Gastrointestinal: Negative for abdominal pain (just pelvic pressure), nausea and vomiting.  Musculoskeletal: Negative for myalgias and neck pain.  Neurological: Positive for dizziness.   Other systems negative  Physical Exam   Patient Vitals for the past 24 hrs:  BP Temp Temp src Pulse Resp SpO2 Weight  01/17/20 0034 (!) 117/46 -- -- 82 -- -- --  01/16/20 2300 124/74 -- -- 88 -- 99 % --  01/16/20 2247 (!) 121/57  -- -- 84 -- -- --  01/16/20 2216 (!) 126/59 -- -- 93 -- -- --  01/16/20 2158 (!) 127/55 98.6 F (37 C) Oral 90 19 -- 90.1 kg  Orthostatic vital signs  Constitutional: Well-developed, well-nourished female in no acute distress.  Cardiovascular: normal rate and rhythm Respiratory: normal effort, clear to auscultation bilaterally GI: Abd soft, non-tender, gravid appropriate for gestational age.   No rebound or guarding. MS: Extremities nontender, no edema, normal ROM Neurologic: Alert and oriented x 4.  GU: Neg CVAT.  PELVIC EXAM:  Dilation: 1 Effacement (%): 30 Cervical Position: Posterior Station: -3 Exam by:: Hansel Feinstein, CNM  FHT:  Baseline 140 , moderate variability, accelerations present, no decelerations Contractions: Uterine irritability  No discrete contractions   Labs: Results for orders placed or performed during the hospital encounter of 01/16/20 (from the past 24 hour(s))  Urinalysis, Routine w reflex microscopic     Status: Abnormal   Collection Time: 01/16/20 10:57 PM  Result Value Ref Range   Color, Urine YELLOW YELLOW   APPearance CLEAR CLEAR   Specific Gravity, Urine 1.017 1.005 - 1.030   pH 6.0 5.0 - 8.0   Glucose, UA NEGATIVE NEGATIVE mg/dL   Hgb urine dipstick NEGATIVE NEGATIVE   Bilirubin Urine NEGATIVE NEGATIVE   Ketones, ur NEGATIVE NEGATIVE mg/dL   Protein, ur NEGATIVE NEGATIVE mg/dL   Nitrite NEGATIVE NEGATIVE   Leukocytes,Ua SMALL (A) NEGATIVE   RBC / HPF 0-5 0 - 5 RBC/hpf   WBC, UA 0-5 0 - 5 WBC/hpf   Bacteria, UA RARE (A) NONE SEEN   Squamous Epithelial / LPF 0-5 0 - 5   Mucus PRESENT     Imaging:  No results found.  MAU Course/MDM: I have ordered labs and reviewed results.   Urine is clear We did orthostatic vital signs which were normal and did not show changes Discussed this may be vertigo or lightheadedness from mild dehydration, Encouraged to push fluid and followup with office, as she may need ENT referral if persists.  NST  reviewed, reactive with good fetal movement throughout long monitoring period Reassured patient that this is a reassuring finding    Assessment: 1. Decreased fetal movements in third trimester, single or unspecified fetus   2. Visual changes   3. Dizziness   4      Single IUP at [redacted]w[redacted]d  Plan: Discharge home Monitor fetal activity PO hydration Preterm Labor precautions and fetal kick counts Follow up in Office for prenatal visits  Encouraged to return here or to other Urgent Care/ED if she develops worsening of symptoms, increase in pain, fever, or other concerning symptoms.   Follow-up Information    Shivaji, Melida Quitter, MD Follow up.   Specialty: Obstetrics and Gynecology Contact information: [address] [place] [address] [phone]  Pt stable at time of discharge.  Hansel Feinstein CNM, MSN Certified Nurse-Midwife 01/17/2020 3:33 AM

## 2020-01-17 ENCOUNTER — Encounter (HOSPITAL_COMMUNITY): Payer: Self-pay | Admitting: Obstetrics and Gynecology

## 2020-01-17 DIAGNOSIS — O163 Unspecified maternal hypertension, third trimester: Secondary | ICD-10-CM | POA: Diagnosis not present

## 2020-01-17 DIAGNOSIS — R102 Pelvic and perineal pain: Secondary | ICD-10-CM | POA: Diagnosis not present

## 2020-01-17 DIAGNOSIS — J45909 Unspecified asthma, uncomplicated: Secondary | ICD-10-CM | POA: Diagnosis not present

## 2020-01-17 DIAGNOSIS — Z833 Family history of diabetes mellitus: Secondary | ICD-10-CM | POA: Diagnosis not present

## 2020-01-17 DIAGNOSIS — O99891 Other specified diseases and conditions complicating pregnancy: Secondary | ICD-10-CM | POA: Diagnosis not present

## 2020-01-17 DIAGNOSIS — H539 Unspecified visual disturbance: Secondary | ICD-10-CM | POA: Diagnosis not present

## 2020-01-17 DIAGNOSIS — O368130 Decreased fetal movements, third trimester, not applicable or unspecified: Secondary | ICD-10-CM | POA: Diagnosis present

## 2020-01-17 DIAGNOSIS — R42 Dizziness and giddiness: Secondary | ICD-10-CM | POA: Diagnosis not present

## 2020-01-17 DIAGNOSIS — O99513 Diseases of the respiratory system complicating pregnancy, third trimester: Secondary | ICD-10-CM | POA: Diagnosis not present

## 2020-01-17 DIAGNOSIS — Z3A31 31 weeks gestation of pregnancy: Secondary | ICD-10-CM | POA: Diagnosis not present

## 2020-01-17 DIAGNOSIS — Z8249 Family history of ischemic heart disease and other diseases of the circulatory system: Secondary | ICD-10-CM | POA: Diagnosis not present

## 2020-01-17 NOTE — Discharge Instructions (Signed)
Dizziness Dizziness is a common problem. It makes you feel unsteady or light-headed. You may feel like you are about to pass out (faint). Dizziness can lead to getting hurt if you stumble or fall. Dizziness can be caused by many things, including:  Medicines.  Not having enough water in your body (dehydration).  Illness. Follow these instructions at home: Eating and drinking   Drink enough fluid to keep your pee (urine) clear or pale yellow. This helps to keep you from getting dehydrated. Try to drink more clear fluids, such as water.  Do not drink alcohol.  Limit how much caffeine you drink or eat, if your doctor tells you to do that.  Limit how much salt (sodium) you drink or eat, if your doctor tells you to do that. Activity   Avoid making quick movements. ? When you stand up from sitting in a chair, steady yourself until you feel okay. ? In the morning, first sit up on the side of the bed. When you feel okay, stand slowly while you hold onto something. Do this until you know that your balance is fine.  If you need to stand in one place for a long time, move your legs often. Tighten and relax the muscles in your legs while you are standing.  Do not drive or use heavy machinery if you feel dizzy.  Avoid bending down if you feel dizzy. Place items in your home so you can reach them easily without leaning over. Lifestyle  Do not use any products that contain nicotine or tobacco, such as cigarettes and e-cigarettes. If you need help quitting, ask your doctor.  Try to lower your stress level. You can do this by using methods such as yoga or meditation. Talk with your doctor if you need help. General instructions  Watch your dizziness for any changes.  Take over-the-counter and prescription medicines only as told by your doctor. Talk with your doctor if you think that you are dizzy because of a medicine that you are taking.  Tell a friend or a family member that you are feeling  dizzy. If he or she notices any changes in your behavior, have this person call your doctor.  Keep all follow-up visits as told by your doctor. This is important. Contact a doctor if:  Your dizziness does not go away.  Your dizziness or light-headedness gets worse.  You feel sick to your stomach (nauseous).  You have trouble hearing.  You have new symptoms.  You are unsteady on your feet.  You feel like the room is spinning. Get help right away if:  You throw up (vomit) or have watery poop (diarrhea), and you cannot eat or drink anything.  You have trouble: ? Talking. ? Walking. ? Swallowing. ? Using your arms, hands, or legs.  You feel generally weak.  You are not thinking clearly, or you have trouble forming sentences. A friend or family member may notice this.  You have: ? Chest pain. ? Pain in your belly (abdomen). ? Shortness of breath. ? Sweating.  Your vision changes.  You are bleeding.  You have a very bad headache.  You have neck pain or a stiff neck.  You have a fever. These symptoms may be an emergency. Do not wait to see if the symptoms will go away. Get medical help right away. Call your local emergency services (911 in the [place] Do not drive yourself to the hospital. Summary  Dizziness makes you feel unsteady or light-headed. You   may feel like you are about to pass out (faint).  Drink enough fluid to keep your pee (urine) clear or pale yellow. Do not drink alcohol.  Avoid making quick movements if you feel dizzy.  Watch your dizziness for any changes. This information is not intended to replace advice given to you by your health care provider. Make sure you discuss any questions you have with your health care provider. Document Revised: 11/26/2017 Document Reviewed: 12/10/2016 Elsevier Patient Education  [place].   Visual Disturbances  A visual disturbance is any problem that interferes with your normal vision. This can affect  one eye or both eyes. Some types of visual disturbances come and go without treatment and do not cause a permanent problem. Other visual disturbances may be a sign of a medical emergency. Visual disturbances include:  Blurred vision.  Being unable to see certain colors.  Being sensitive to light.  Double vision.  Partial vision loss (visual field deficit).  Being unaware of objects on one side of the body (visual spatial inattention).  Rhythmic eye movements that you cannot control (nystagmus).  Short-term or long-term blindness.  Seeing: ? Floating spots or lines (floaters). ? Flashing or shimmering lights. ? Zigzagging lines or stars. ? The floor as tilted (visual midline shift). ? Things that are not really there (hallucinations). Causes of visual disturbances include:  Eye infection.  The thin membrane at the back of the eye separating from the eyeball (retinal detachment).  High blood pressure.  Migraine.  Glaucoma.  Ischemic stroke.  Cerebral aneurysm. It is important to get your eyes checked by a health care provider or eye specialist (ophthalmologist or optometrist) as soon as possible to determine the cause of your visual disturbance. Follow these instructions at home:  Take over-the-counter and prescription medicines only as told by your health care provider.  Do not use any products that contain nicotine or tobacco, such as cigarettes and e-cigarettes. If you need help quitting, ask your health care provider.  To lower your risk of the problems that can lead to visual disturbances: ? Eat a balanced diet that includes fruits and vegetables, whole grains, lean meat, and low-fat dairy. ? Maintain a healthy weight. Work with your health care provider to lose weight if you need to. ? Exercise regularly. Ask your health care provider what activities are safe for you.  Do not drive if you have trouble seeing. Ask your health care provider for guidance about  when it is and is not safe for you to drive.  Keep all follow-up visits as told by your health care provider. This is important. Contact a health care provider if:  Your visual disturbance changes or becomes worse. Get help right away if you:   Have new visual disturbances.  Suddenly see flashing lights or floaters.  Suddenly have a dark area in your field of vision, especially in the lower part. This can lead to a loss of central vision.  Lose vision in one or both eyes.  Have any symptoms of a stroke. "BE FAST" is an easy way to remember the main warning signs of a stroke: ? B - Balance. Signs are dizziness, sudden trouble walking, or loss of balance. ? E - Eyes. Signs are trouble seeing or a sudden change in vision. ? F - Face. Signs are sudden weakness or numbness of the face, or the face or eyelid drooping on one side. ? A - Arms. Signs are weakness or numbness in an arm.  This happens suddenly and usually on one side of the body. ? S - Speech. Signs are sudden trouble speaking, slurred speech, or trouble understanding what people say. ? T - Time. Time to call emergency services. Write down what time symptoms started.  Have other signs of a stroke, such as: ? A sudden, severe headache with no known cause. ? Nausea or vomiting. ? Seizure. These symptoms may represent a serious problem that is an emergency. Do not wait to see if the symptoms will go away. Get medical help right away. Call your local emergency services (911 in the [place] Do not drive yourself to the hospital. Summary  A visual disturbance is any problem that interferes with your normal vision.  Some visual disturbances may be a sign of a medical emergency.  It is important to get your eyes checked by a health care provider or eye specialist to determine what kind of visual disturbance you have. This information is not intended to replace advice given to you by your health care provider. Make sure you discuss any  questions you have with your health care provider. Document Revised: 03/14/2019 Document Reviewed: 12/14/2017 Elsevier Patient Education  [place] of Pregnancy  The third trimester is from week 28 through week 40 (months 7 through 9). This trimester is when your unborn baby (fetus) is growing very fast. At the end of the ninth month, the unborn baby is about 20 inches in length. It weighs about 6-10 pounds. Follow these instructions at home: Medicines  Take over-the-counter and prescription medicines only as told by your doctor. Some medicines are safe and some medicines are not safe during pregnancy.  Take a prenatal vitamin that contains at least 600 micrograms (mcg) of folic acid.  If you have trouble pooping (constipation), take medicine that will make your stool soft (stool softener) if your doctor approves. Eating and drinking   Eat regular, healthy meals.  Avoid raw meat and uncooked cheese.  If you get low calcium from the food you eat, talk to your doctor about taking a daily calcium supplement.  Eat four or five small meals rather than three large meals a day.  Avoid foods that are high in fat and sugars, such as fried and sweet foods.  To prevent constipation: ? Eat foods that are high in fiber, like fresh fruits and vegetables, whole grains, and beans. ? Drink enough fluids to keep your pee (urine) clear or pale yellow. Activity  Exercise only as told by your doctor. Stop exercising if you start to have cramps.  Avoid heavy lifting, wear low heels, and sit up straight.  Do not exercise if it is too hot, too humid, or if you are in a place of great height (high altitude).  You may continue to have sex unless your doctor tells you not to. Relieving pain and discomfort  Wear a good support bra if your breasts are tender.  Take frequent breaks and rest with your legs raised if you have leg cramps or low back pain.  Take warm water  baths (sitz baths) to soothe pain or discomfort caused by hemorrhoids. Use hemorrhoid cream if your doctor approves.  If you develop puffy, bulging veins (varicose veins) in your legs: ? Wear support hose or compression stockings as told by your doctor. ? Raise (elevate) your feet for 15 minutes, 3-4 times a day. ? Limit salt in your food. Safety  Wear your seat belt when driving.  Make a  list of emergency phone numbers, including numbers for family, friends, the hospital, and police and fire departments. Preparing for your baby's arrival To prepare for the arrival of your baby:  Take prenatal classes.  Practice driving to the hospital.  Visit the hospital and tour the maternity area.  Talk to your work about taking leave once the baby comes.  Pack your hospital bag.  Prepare the baby's room.  Go to your doctor visits.  Buy a rear-facing car seat. Learn how to install it in your car. General instructions  Do not use hot tubs, steam rooms, or saunas.  Do not use any products that contain nicotine or tobacco, such as cigarettes and e-cigarettes. If you need help quitting, ask your doctor.  Do not drink alcohol.  Do not douche or use tampons or scented sanitary pads.  Do not cross your legs for long periods of time.  Do not travel for long distances unless you must. Only do so if your doctor says it is okay.  Visit your dentist if you have not gone during your pregnancy. Use a soft toothbrush to brush your teeth. Be gentle when you floss.  Avoid cat litter boxes and soil used by cats. These carry germs that can cause birth defects in the baby and can cause a loss of your baby (miscarriage) or stillbirth.  Keep all your prenatal visits as told by your doctor. This is important. Contact a doctor if:  You are not sure if you are in labor or if your water has broken.  You are dizzy.  You have mild cramps or pressure in your lower belly.  You have a nagging pain in  your belly area.  You continue to feel sick to your stomach, you throw up, or you have watery poop.  You have bad smelling fluid coming from your vagina.  You have pain when you pee. Get help right away if:  You have a fever.  You are leaking fluid from your vagina.  You are spotting or bleeding from your vagina.  You have severe belly cramps or pain.  You lose or gain weight quickly.  You have trouble catching your breath and have chest pain.  You notice sudden or extreme puffiness (swelling) of your face, hands, ankles, feet, or legs.  You have not felt the baby move in over an hour.  You have severe headaches that do not go away with medicine.  You have trouble seeing.  You are leaking, or you are having a gush of fluid, from your vagina before you are 37 weeks.  You have regular belly spasms (contractions) before you are 37 weeks. Summary  The third trimester is from week 28 through week 40 (months 7 through 9). This time is when your unborn baby is growing very fast.  Follow your doctor's advice about medicine, food, and activity.  Get ready for the arrival of your baby by taking prenatal classes, getting all the baby items ready, preparing the baby's room, and visiting your doctor to be checked.  Get help right away if you are bleeding from your vagina, or you have chest pain and trouble catching your breath, or if you have not felt your baby move in over an hour. This information is not intended to replace advice given to you by your health care provider. Make sure you discuss any questions you have with your health care provider. Document Revised: 03/16/2019 Document Reviewed: 12/29/2016 Elsevier Patient Education  [place].

## 2020-02-22 LAB — OB RESULTS CONSOLE GBS: GBS: NEGATIVE

## 2020-03-06 ENCOUNTER — Encounter (HOSPITAL_COMMUNITY): Payer: Self-pay | Admitting: *Deleted

## 2020-03-06 ENCOUNTER — Telehealth (HOSPITAL_COMMUNITY): Payer: Self-pay | Admitting: *Deleted

## 2020-03-06 NOTE — Telephone Encounter (Signed)
Preadmission screen  

## 2020-03-07 ENCOUNTER — Encounter (HOSPITAL_COMMUNITY): Payer: Self-pay | Admitting: *Deleted

## 2020-03-07 ENCOUNTER — Telehealth (HOSPITAL_COMMUNITY): Payer: Self-pay | Admitting: *Deleted

## 2020-03-07 NOTE — Telephone Encounter (Signed)
Preadmission screen  

## 2020-03-09 ENCOUNTER — Other Ambulatory Visit (HOSPITAL_COMMUNITY)
Admission: RE | Admit: 2020-03-09 | Discharge: 2020-03-09 | Disposition: A | Discharge status: Home / Self Care | Payer: Medicaid Other | Source: Ambulatory Visit | Attending: Obstetrics and Gynecology | Admitting: Obstetrics and Gynecology

## 2020-03-09 DIAGNOSIS — Z01812 Encounter for preprocedural laboratory examination: Secondary | ICD-10-CM | POA: Insufficient documentation

## 2020-03-09 DIAGNOSIS — Z20822 Contact with and (suspected) exposure to covid-19: Secondary | ICD-10-CM | POA: Diagnosis not present

## 2020-03-09 LAB — SARS CORONAVIRUS 2 (TAT 6-24 HRS): SARS Coronavirus 2: NEGATIVE

## 2020-03-11 ENCOUNTER — Other Ambulatory Visit: Payer: Self-pay

## 2020-03-11 ENCOUNTER — Inpatient Hospital Stay (HOSPITAL_COMMUNITY): Payer: Medicaid Other

## 2020-03-11 ENCOUNTER — Encounter (HOSPITAL_COMMUNITY): Payer: Self-pay | Admitting: Obstetrics and Gynecology

## 2020-03-11 ENCOUNTER — Inpatient Hospital Stay (HOSPITAL_COMMUNITY)
Admission: AD | Admit: 2020-03-11 | Discharge: 2020-03-12 | DRG: 807 | Disposition: A | Discharge status: Home / Self Care | Payer: Medicaid Other | Attending: Obstetrics and Gynecology | Admitting: Obstetrics and Gynecology

## 2020-03-11 DIAGNOSIS — Z20822 Contact with and (suspected) exposure to covid-19: Secondary | ICD-10-CM | POA: Diagnosis present

## 2020-03-11 DIAGNOSIS — O9952 Diseases of the respiratory system complicating childbirth: Secondary | ICD-10-CM | POA: Diagnosis present

## 2020-03-11 DIAGNOSIS — Z3A39 39 weeks gestation of pregnancy: Secondary | ICD-10-CM

## 2020-03-11 DIAGNOSIS — O3483 Maternal care for other abnormalities of pelvic organs, third trimester: Secondary | ICD-10-CM | POA: Diagnosis present

## 2020-03-11 DIAGNOSIS — O26893 Other specified pregnancy related conditions, third trimester: Secondary | ICD-10-CM | POA: Diagnosis present

## 2020-03-11 DIAGNOSIS — J45909 Unspecified asthma, uncomplicated: Secondary | ICD-10-CM | POA: Diagnosis present

## 2020-03-11 DIAGNOSIS — D270 Benign neoplasm of right ovary: Secondary | ICD-10-CM | POA: Diagnosis present

## 2020-03-11 LAB — RPR: RPR Ser Ql: NONREACTIVE

## 2020-03-11 LAB — CBC
HCT: 36.9 % (ref 36.0–46.0)
Hemoglobin: 11.8 g/dL — ABNORMAL LOW (ref 12.0–15.0)
MCH: 26.5 pg (ref 26.0–34.0)
MCHC: 32 g/dL (ref 30.0–36.0)
MCV: 82.7 fL (ref 80.0–100.0)
Platelets: 380 10*3/uL (ref 150–400)
RBC: 4.46 MIL/uL (ref 3.87–5.11)
RDW: 14.4 % (ref 11.5–15.5)
WBC: 8.5 10*3/uL (ref 4.0–10.5)
nRBC: 0 % (ref 0.0–0.2)

## 2020-03-11 LAB — TYPE AND SCREEN
ABO/RH(D): O POS
Antibody Screen: NEGATIVE

## 2020-03-11 LAB — ABO/RH: ABO/RH(D): O POS

## 2020-03-11 MED ORDER — SIMETHICONE 80 MG PO CHEW: 80.0000 mg | CHEWABLE_TABLET | ORAL | Status: DC | PRN

## 2020-03-11 MED ORDER — TERBUTALINE SULFATE 1 MG/ML IJ SOLN: 0.2500 mg | Freq: Once | INTRAMUSCULAR | Status: DC | PRN

## 2020-03-11 MED ORDER — SENNOSIDES-DOCUSATE SODIUM 8.6-50 MG PO TABS
2.0000 | ORAL_TABLET | ORAL | Status: DC
  Administered 2020-03-11: 2 via ORAL
  Filled 2020-03-11: qty 2

## 2020-03-11 MED ORDER — ACETAMINOPHEN 325 MG PO TABS
650.0000 mg | ORAL_TABLET | ORAL | Status: DC | PRN
  Administered 2020-03-11: 650 mg via ORAL
  Filled 2020-03-11: qty 2

## 2020-03-11 MED ORDER — DIBUCAINE (PERIANAL) 1 % EX OINT: 1.0000 "application " | TOPICAL_OINTMENT | CUTANEOUS | Status: DC | PRN

## 2020-03-11 MED ORDER — METHYLERGONOVINE MALEATE 0.2 MG/ML IJ SOLN
0.2000 mg | Freq: Once | INTRAMUSCULAR | Status: AC
  Administered 2020-03-11: 0.2 mg via INTRAMUSCULAR

## 2020-03-11 MED ORDER — OXYTOCIN 10 UNIT/ML IJ SOLN
INTRAMUSCULAR | Status: AC
  Administered 2020-03-11: 10 [IU]
  Filled 2020-03-11: qty 1

## 2020-03-11 MED ORDER — OXYCODONE-ACETAMINOPHEN 5-325 MG PO TABS: 1.0000 | ORAL_TABLET | ORAL | Status: DC | PRN

## 2020-03-11 MED ORDER — WITCH HAZEL-GLYCERIN EX PADS: 1.0000 "application " | MEDICATED_PAD | CUTANEOUS | Status: DC | PRN

## 2020-03-11 MED ORDER — COCONUT OIL OIL: 1.0000 "application " | TOPICAL_OIL | Status: DC | PRN

## 2020-03-11 MED ORDER — SOD CITRATE-CITRIC ACID 500-334 MG/5ML PO SOLN: 30.0000 mL | ORAL | Status: DC | PRN

## 2020-03-11 MED ORDER — ONDANSETRON HCL 4 MG PO TABS: 4.0000 mg | ORAL_TABLET | ORAL | Status: DC | PRN

## 2020-03-11 MED ORDER — PRENATAL MULTIVITAMIN CH
1.0000 | ORAL_TABLET | Freq: Every day | ORAL | Status: DC
  Administered 2020-03-12: 1 via ORAL
  Filled 2020-03-11: qty 1

## 2020-03-11 MED ORDER — OXYCODONE HCL 5 MG PO TABS: 10.0000 mg | ORAL_TABLET | ORAL | Status: DC | PRN

## 2020-03-11 MED ORDER — OXYTOCIN BOLUS FROM INFUSION: 500.0000 mL | Freq: Once | INTRAVENOUS | Status: DC

## 2020-03-11 MED ORDER — FLEET ENEMA 7-19 GM/118ML RE ENEM: 1.0000 | ENEMA | RECTAL | Status: DC | PRN

## 2020-03-11 MED ORDER — LACTATED RINGERS IV SOLN: INTRAVENOUS | Status: DC

## 2020-03-11 MED ORDER — METHYLERGONOVINE MALEATE 0.2 MG/ML IJ SOLN
INTRAMUSCULAR | Status: AC
  Filled 2020-03-11: qty 1

## 2020-03-11 MED ORDER — IBUPROFEN 600 MG PO TABS
600.0000 mg | ORAL_TABLET | Freq: Four times a day (QID) | ORAL | Status: DC
  Administered 2020-03-11 – 2020-03-12 (×5): 600 mg via ORAL
  Filled 2020-03-11 (×5): qty 1

## 2020-03-11 MED ORDER — ONDANSETRON HCL 4 MG/2ML IJ SOLN: 4.0000 mg | Freq: Four times a day (QID) | INTRAMUSCULAR | Status: DC | PRN

## 2020-03-11 MED ORDER — TETANUS-DIPHTH-ACELL PERTUSSIS 5-2.5-18.5 LF-MCG/0.5 IM SUSP: 0.5000 mL | Freq: Once | INTRAMUSCULAR | Status: DC

## 2020-03-11 MED ORDER — ACETAMINOPHEN 325 MG PO TABS: 650.0000 mg | ORAL_TABLET | ORAL | Status: DC | PRN

## 2020-03-11 MED ORDER — BENZOCAINE-MENTHOL 20-0.5 % EX AERO: 1.0000 "application " | INHALATION_SPRAY | CUTANEOUS | Status: DC | PRN

## 2020-03-11 MED ORDER — OXYCODONE HCL 5 MG PO TABS: 5.0000 mg | ORAL_TABLET | ORAL | Status: DC | PRN

## 2020-03-11 MED ORDER — DIPHENHYDRAMINE HCL 25 MG PO CAPS: 25.0000 mg | ORAL_CAPSULE | Freq: Four times a day (QID) | ORAL | Status: DC | PRN

## 2020-03-11 MED ORDER — ONDANSETRON HCL 4 MG/2ML IJ SOLN: 4.0000 mg | INTRAMUSCULAR | Status: DC | PRN

## 2020-03-11 MED ORDER — LIDOCAINE HCL (PF) 1 % IJ SOLN: 30.0000 mL | INTRAMUSCULAR | Status: DC | PRN

## 2020-03-11 MED ORDER — ZOLPIDEM TARTRATE 5 MG PO TABS: 5.0000 mg | ORAL_TABLET | Freq: Every evening | ORAL | Status: DC | PRN

## 2020-03-11 MED ORDER — OXYTOCIN 40 UNITS IN NORMAL SALINE INFUSION - SIMPLE MED: 2.5000 [IU]/h | INTRAVENOUS | Status: DC

## 2020-03-11 MED ORDER — LACTATED RINGERS IV SOLN: 500.0000 mL | INTRAVENOUS | Status: DC | PRN

## 2020-03-11 MED ORDER — OXYTOCIN 40 UNITS IN NORMAL SALINE INFUSION - SIMPLE MED
1.0000 m[IU]/min | INTRAVENOUS | Status: DC
  Administered 2020-03-11: 2 m[IU]/min via INTRAVENOUS
  Filled 2020-03-11: qty 1000

## 2020-03-11 MED ORDER — OXYCODONE-ACETAMINOPHEN 5-325 MG PO TABS: 2.0000 | ORAL_TABLET | ORAL | Status: DC | PRN

## 2020-03-11 NOTE — H&P (Signed)
Jillian Bishop is a 24 y.o. [id number] female presenting for elective iol due to favorable cervix at 64 4/7wks. Pt is dated per an 8week [place]. 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 She has a history of asthma - controlled. She had a negative essential panel and panorama screen. GBS is negative. Sars covid screen negative OB History    Gravida  5   Para  3   Term  3   Preterm      AB  1   Living  3     SAB  1   TAB      Ectopic      Multiple  0   Live Births  3          Past Medical History:  Diagnosis Date  . Asthma   . Benign teratoma of ovary, right   . HPV (human papilloma virus) infection    Past Surgical History:  Procedure Laterality Date  . APPENDECTOMY    . DILATION AND EVACUATION N/A 11/19/2018   Procedure: DILATATION AND EVACUATION;  Surgeon: Paula Compton, MD;  Location: [facility];  Service: Gynecology;  Laterality: N/A;   Family History: family history includes Diabetes in her father; Heart disease in her paternal grandmother; Hypertension in her father, mother, paternal grandmother, and sister. Social History:  reports that she has never smoked. She has never used smokeless tobacco. She reports that she does not drink alcohol or use drugs.     Maternal Diabetes: No Genetic Screening: Normal Maternal Ultrasounds/Referrals: Normal Fetal Ultrasounds or other Referrals:  None Maternal Substance Abuse:  No Significant Maternal Medications:  None Significant Maternal Lab Results:  Group B Strep negative Other Comments:  None  Review of Systems  Constitutional: Positive for fatigue. Negative for activity change, appetite change and chills.  Eyes: Negative for photophobia and visual disturbance.  Respiratory: Negative for chest tightness and shortness of breath.   Cardiovascular: Negative for palpitations and leg swelling.  Gastrointestinal: Negative for abdominal pain.   Genitourinary: Positive for pelvic pain.  Musculoskeletal: Negative for back pain.  Neurological: Negative for dizziness, tremors and headaches.  Psychiatric/Behavioral: Negative for sleep disturbance. The patient is not nervous/anxious.    Maternal Medical History:  Reason for admission: Elective iol; favorable cervix  Contractions: Onset was 1 week or more ago.   Frequency: irregular.   Perceived severity is mild.    Fetal activity: Perceived fetal activity is normal.   Last perceived fetal movement was within the past hour.    Prenatal complications: no prenatal complications Prenatal Complications - Diabetes: none.    Dilation: 4.5 Effacement (%): 70 Exam by:: Zalma Channing Blood pressure 134/69, pulse 99, height 5\' 4"  (1.626 m), weight 89.8 kg, unknown if currently breastfeeding. Maternal Exam:  Uterine Assessment: Contraction strength is mild.  Contraction frequency is rare and irregular.   Abdomen: Patient reports no abdominal tenderness. Estimated fetal weight is AGA.   Fetal presentation: vertex  Introitus: Normal vulva. Vulva is negative for condylomata and lesion.  Normal vagina.  Vagina is negative for condylomata.  Amniotic fluid character: clear.  Pelvis: adequate for delivery.      Fetal Exam Fetal Monitor Review: Baseline rate: 140.  Variability: moderate (6-25 bpm).   Pattern: accelerations present and no decelerations.    Fetal State Assessment: Category I - tracings are normal.     Physical Exam  Constitutional: She is oriented to person,  place, and time. She appears well-developed and well-nourished.  Cardiovascular: Intact distal pulses.  Respiratory: Effort normal.  GI: Soft.  Genitourinary:    Vulva and vagina normal.     No vulval condylomata or lesion noted.   Musculoskeletal:        General: Edema present. Normal range of motion.     Cervical back: Normal range of motion.  Neurological: She is alert and oriented to person, place, and  time.  Skin: Skin is warm.  Psychiatric: She has a normal mood and affect. Her behavior is normal. Judgment and thought content normal.    Prenatal labs: ABO, Rh: O/Positive/-- (08/27 0000) Antibody: Negative (08/27 0000) Rubella: Immune (08/27 0000) RPR: Nonreactive (08/27 0000)  HBsAg: Negative (08/27 0000)  HIV: Non-reactive (08/27 0000)  GBS: Negative/-- (03/18 0000)   Assessment/Plan: [id number] female at 81 4/7wks for elective iol - Admitted - Pitocin started per protocol - AROM performed with copious amounts of clear fluid: 4.5/75/-2 - Pain control per pt request - GBS neg -Sars covid screen neg - Anticipate svd with no complications   Venetia Night Atiyah Bauer 03/11/2020, 9:12 AM

## 2020-03-11 NOTE — Lactation Note (Signed)
This note was copied from a baby's chart. Lactation Consultation Note  Patient Name: Jillian Bishop [id number] Date: 03/11/2020 Reason for consult: Initial assessment;Term  P3 mother whose infant is now 19 hours old.  This is a term baby at 39+4 weeks.  Mother stated she breast fed her daughter and had "no problem" with milk supply or feeding.  Mother was eating lunch and on the phone with a family member.  She was very pleasant but did not seem very interested in wanting to speak with me at this time.  I asked her if she would like me to return and she informed me that it was okay for me to be there but baby  has fed well so far and mother had  no questions.  Encouraged to feed 8-12 times/24 hours or sooner if baby shows feeding cues.  Mother is familiar with feeding cues and hand expression and did not wish to review.  Colostrum container provided and milk storage times discussed.  Finger feeding demonstrated.  Mother has a DEBP for home use.  Mom made aware of O/P services, breastfeeding support groups, community resources, and our phone # for post-discharge questions.  RN updated.   Maternal Data Formula Feeding for Exclusion: No Has patient been taught Hand Expression?: Yes Does the patient have breastfeeding experience prior to this delivery?: Yes  Feeding Feeding Type: Breast Fed  LATCH Score                   Interventions    Lactation Tools Discussed/Used     Consult Status Consult Status: Follow-up Date: 03/12/20 Follow-up type: In-patient    Little Ishikawa 03/11/2020, 3:34 PM

## 2020-03-12 LAB — CBC
HCT: 32.2 % — ABNORMAL LOW (ref 36.0–46.0)
Hemoglobin: 10.2 g/dL — ABNORMAL LOW (ref 12.0–15.0)
MCH: 26.4 pg (ref 26.0–34.0)
MCHC: 31.7 g/dL (ref 30.0–36.0)
MCV: 83.2 fL (ref 80.0–100.0)
Platelets: 339 10*3/uL (ref 150–400)
RBC: 3.87 MIL/uL (ref 3.87–5.11)
RDW: 14.5 % (ref 11.5–15.5)
WBC: 10.4 10*3/uL (ref 4.0–10.5)
nRBC: 0 % (ref 0.0–0.2)

## 2020-03-12 MED ORDER — IBUPROFEN 600 MG PO TABS: 600.0000 mg | ORAL_TABLET | Freq: Four times a day (QID) | ORAL | 0 refills | Status: DC

## 2020-03-12 NOTE — Discharge Instructions (Signed)
As per discharge pamphlet °

## 2020-03-12 NOTE — Discharge Summary (Signed)
OB Discharge Summary     Patient Name: Jillian Bishop DOB: [birth year] MRN: [id number]  Date of admission: 03/11/2020 Delivering MD: Carlynn Purl [facility]   Date of discharge: 03/12/2020  Admitting diagnosis: Indication for care in labor or delivery [O75.9] Intrauterine pregnancy: [redacted]w[redacted]d     Secondary diagnosis:  Active Problems:   Indication for care in labor or delivery   SVD (spontaneous vaginal delivery)      Discharge diagnosis: Term Pregnancy [place] Hospital course:  Induction of Labor With Vaginal Delivery   23 y.o. yo [id number] at [redacted]w[redacted]d was admitted to the hospital 03/11/2020 for induction of labor.  Indication for induction: Favorable cervix at term.  Patient had an uncomplicated labor course as follows: Membrane Rupture Time/Date: 9:07 AM ,03/11/2020   Intrapartum Procedures: Episiotomy: None [1]                                         Lacerations:  None [1]  Patient had delivery of a Viable infant.  Information for the patient's newborn:  Kang, Lesko [id number]      03/11/2020  Details of delivery can be found in separate delivery note.  Patient had a routine postpartum course. Patient is discharged home 03/12/20.  Physical exam  Vitals:   03/11/20 1831 03/11/20 2218 03/12/20 0321 03/12/20 0518  BP: 119/63 (!) 108/53 104/68 (!) 100/44  Pulse: 85 77 80 72  Resp: 18 16 16 16   Temp: 97.7 F (36.5 C) 98.1 F (36.7 C) 97.9 F (36.6 C) 97.9 F (36.6 C)  TempSrc: Oral Oral Oral Oral  SpO2: 100%     Weight:      Height:       General: alert Lochia: appropriate Uterine Fundus: firm  Labs: Lab Results  Component Value Date   WBC 10.4 03/12/2020   HGB 10.2 (L) 03/12/2020   HCT 32.2 (L) 03/12/2020   MCV 83.2 03/12/2020   PLT 339 03/12/2020   CMP Latest Ref Rng & Units 11/13/2015  Glucose 65 - 99 mg/dL 80    Discharge instruction: per After Visit Summary and  "Baby and Me Booklet".  After visit meds:  Allergies as of 03/12/2020   No Known Allergies     Medication List    TAKE these medications   albuterol 108 (90 Base) MCG/ACT inhaler Commonly known as: VENTOLIN HFA Inhale 2 puffs into the lungs every 6 (six) hours as needed for wheezing or shortness of breath. Reported on 03/12/2016   ibuprofen 600 MG tablet Commonly known as: ADVIL Take 1 tablet (600 mg total) by mouth every 6 (six) hours.   IRON PO Take by mouth.   prenatal multivitamin Tabs tablet Take 1 tablet by mouth daily at 12 noon.       Diet: routine diet  Activity: Advance as tolerated. Pelvic rest for 6  weeks.   Outpatient follow up:6 weeks  Newborn Data: Live born female  Birth Weight: 6 lb 15.8 oz (3170 g) APGAR: 8, 9  Newborn Delivery   Birth date/time: 03/11/2020 11:53:00 Delivery type: Vaginal, Spontaneous      Baby Feeding: Breast Disposition:home with mother   03/12/2020 Clarene Duke, MD

## 2020-03-12 NOTE — Progress Notes (Signed)
CSW received consult for hx of Anxiety.  CSW met with [name] to offer support and complete assessment.    CSW congratulated [name] on the birth of infant ([place]). CSW advised [name] of CSW's role and the reason for CSW coming to visit with her. [name] reported that during her pregnancy she was nervous as "in 2019 I had a miscarriage at 10 weeks, but aside from that I was fine". [name] reports that she was given medication and didn't take them as she didn't want to . CSW asked [name] how she coped and [name] reported that's he just found ways. [name] denied ever being in therapy and reports that sh doesn't feel that she needs resources for therapy at this time. [name] expressed that she has all needed items to care for infant and denies having any other mental health diagnosis aside from anxiety which was given in 2020 per [name]'s report. [name] denies SI and HI and DV. [name] expressed that infant has basinet to sleep in once arrived home and will be seen at [facility] in [place].   CSW provided education regarding the baby blues period vs. perinatal mood disorders, discussed treatment and gave resources for mental health follow up if concerns arise.  CSW recommends self-evaluation during the postpartum time period using the New Mom Checklist from Postpartum Progress and encouraged [name] to contact a medical professional if symptoms are noted at any time.   CSW provided review of Sudden Infant Death Syndrome (SIDS) precautions.   CSW identifies no further need for intervention and no barriers to discharge at this time.    Jillian Bishop S. Jillian Bishop, MSW, LCSW [facility] [phone]   

## 2020-03-12 NOTE — Lactation Note (Signed)
This note was copied from a baby's chart. Lactation Consultation Note  Patient Name: Jillian Bishop [id number] Date: 03/12/2020 Reason for consult: Follow-up assessment;Term Baby is 53 hours old.  Mom reports that baby is breastfeeding without difficulty.  No questions or concerns.  Reviewed outpatient services and encouraged to call prn.  Maternal Data    Feeding Feeding Type: Breast Fed  LATCH Score                   Interventions    Lactation Tools Discussed/Used     Consult Status Consult Status: Complete Follow-up type: Call as needed    [name] 03/12/2020, 2:47 PM

## 2020-03-12 NOTE — Progress Notes (Signed)
PPD #1 No problems, wants to go home Afeb, VSS Fundus firm, NT at U-1 Continue routine postpartum care, discharge home this pm

## 2020-06-04 DIAGNOSIS — H5203 Hypermetropia, bilateral: Secondary | ICD-10-CM | POA: Diagnosis not present

## 2020-06-04 DIAGNOSIS — H1013 Acute atopic conjunctivitis, bilateral: Secondary | ICD-10-CM | POA: Diagnosis not present

## 2020-08-10 ENCOUNTER — Telehealth: Payer: Medicaid Other | Admitting: Family

## 2020-08-10 DIAGNOSIS — J029 Acute pharyngitis, unspecified: Secondary | ICD-10-CM

## 2020-08-10 NOTE — Progress Notes (Signed)
We are sorry that you are not feeling well.  Here is how we plan to help!  Your symptoms indicate a likely viral infection (Pharyngitis).   Pharyngitis is inflammation in the back of the throat which can cause a sore throat, scratchiness and sometimes difficulty swallowing.   Pharyngitis is typically caused by a respiratory virus and will just run its course.  Please keep in mind that your symptoms could last up to 10 days.  For throat pain, we recommend over the counter oral pain relief medications such as acetaminophen or aspirin, or anti-inflammatory medications such as ibuprofen or naproxen sodium.  Topical treatments such as oral throat lozenges or sprays may be used as needed.  Avoid close contact with loved ones, especially the very young and elderly.  Remember to wash your hands thoroughly throughout the day as this is the number one way to prevent the spread of infection and wipe down door knobs and counters with disinfectant.  After careful review of your answers, I would not recommend and antibiotic for your condition.  Antibiotics should not be used to treat conditions that we suspect are caused by viruses like the virus that causes the common cold or flu. However, some people can have Strep with atypical symptoms. You may need formal testing in clinic or office to confirm if your symptoms continue or worsen.  Providers prescribe antibiotics to treat infections caused by bacteria. Antibiotics are very powerful in treating bacterial infections when they are used properly.  To maintain their effectiveness, they should be used only when necessary.  Overuse of antibiotics has resulted in the development of super bugs that are resistant to treatment!    Home Care:  Only take medications as instructed by your medical team.  Do not drink alcohol while taking these medications.  A steam or ultrasonic humidifier can help congestion.  You can place a towel over your head and breathe in the steam from  hot water coming from a faucet.  Avoid close contacts especially the very young and the elderly.  Cover your mouth when you cough or sneeze.  Always remember to wash your hands.  Get Help Right Away If:  You develop worsening fever or throat pain.  You develop a severe head ache or visual changes.  Your symptoms persist after you have completed your treatment plan.  Make sure you  Understand these instructions.  Will watch your condition.  Will get help right away if you are not doing well or get worse.  Your e-visit answers were reviewed by a board certified advanced clinical practitioner to complete your personal care plan.  Depending on the condition, your plan could have included both over the counter or prescription medications.  If there is a problem please reply  once you have received a response from your provider.  Your safety is important to [place].  If you have drug allergies check your prescription carefully.    You can use MyChart to ask questions about todays visit, request a non-urgent call back, or ask for a work or school excuse for 24 hours related to this e-Visit. If it has been greater than 24 hours you will need to follow up with your provider, or enter a new e-Visit to address those concerns.  You will get an e-mail in the next two days asking about your experience.  I hope that your e-visit has been valuable and will speed your recovery. Thank you for using e-visits.  Greater than 5 minutes, yet  less than 10 minutes of time have been spent researching, coordinating, and implementing care for this patient today.  Thank you for the details you included in the comment boxes. Those details are very helpful in determining the best course of treatment for you and help [place] to provide the best care.

## 2020-08-14 ENCOUNTER — Other Ambulatory Visit: Payer: Self-pay

## 2020-08-30 ENCOUNTER — Encounter (HOSPITAL_BASED_OUTPATIENT_CLINIC_OR_DEPARTMENT_OTHER): Payer: Self-pay | Admitting: Obstetrics and Gynecology

## 2020-08-30 ENCOUNTER — Other Ambulatory Visit: Payer: Self-pay

## 2020-08-30 NOTE — Progress Notes (Signed)
Spoke w/ via phone for pre-op interview---pt Lab needs dos----        Cbc, urine preg, t & s       Lab results------none COVID test ------09-02-2020 1000 Arrive at -------530 am 09-05-20 NPO after MN NO Solid Food.  Clear liquids from MN until---430 am then npo Medications to take morning of surgery -----none Diabetic medication -----n/a Patient Special Instructions -----none Pre-Op special Istructions -----none Patient verbalized understanding of instructions that were given at this phone interview. Patient denies shortness of breath, chest pain, fever, cough at this phone interview.

## 2020-09-02 ENCOUNTER — Other Ambulatory Visit (HOSPITAL_COMMUNITY)
Admission: RE | Admit: 2020-09-02 | Discharge: 2020-09-02 | Disposition: A | Discharge status: Home / Self Care | Payer: Medicaid Other | Source: Ambulatory Visit | Attending: Obstetrics and Gynecology | Admitting: Obstetrics and Gynecology

## 2020-09-02 DIAGNOSIS — Z20822 Contact with and (suspected) exposure to covid-19: Secondary | ICD-10-CM | POA: Diagnosis not present

## 2020-09-02 DIAGNOSIS — Z01812 Encounter for preprocedural laboratory examination: Secondary | ICD-10-CM | POA: Diagnosis present

## 2020-09-02 LAB — SARS CORONAVIRUS 2 (TAT 6-24 HRS): SARS Coronavirus 2: NEGATIVE

## 2020-09-04 NOTE — Anesthesia Preprocedure Evaluation (Addendum)
Anesthesia Evaluation  Patient identified by MRN, date of birth, ID band Patient awake    Reviewed: Allergy & Precautions, NPO status , Patient's Chart, lab work & pertinent test results  History of Anesthesia Complications Negative for: history of anesthetic complications  Airway Mallampati: IV  TM Distance: >3 FB Neck ROM: Full  Mouth opening: Limited Mouth Opening  Dental  (+) Dental Advisory Given, Teeth Intact   Pulmonary asthma ,    Pulmonary exam normal        Cardiovascular negative cardio ROS Normal cardiovascular exam     Neuro/Psych  Headaches, negative psych ROS   GI/Hepatic Neg liver ROS, GERD  Controlled,  Endo/Other   Obesity   Renal/GU negative Renal ROS     Musculoskeletal negative musculoskeletal ROS (+)   Abdominal (+) + obese,   Peds  Hematology negative hematology ROS (+)   Anesthesia Other Findings Covid+ 10/2019, mild symptoms (SOB) not requiring hospitalization, since resolved Covid test negative   Reproductive/Obstetrics                            Anesthesia Physical Anesthesia Plan  ASA: II  Anesthesia Plan: General   Post-op Pain Management:    Induction: Intravenous  PONV Risk Score and Plan: 4 or greater and Treatment may vary due to age or medical condition, Ondansetron, Dexamethasone, Midazolam and Scopolamine patch - Pre-op  Airway Management Planned: Oral ETT  Additional Equipment: None  Intra-op Plan:   Post-operative Plan: Extubation in OR  Informed Consent: I have reviewed the patients History and Physical, chart, labs and discussed the procedure including the risks, benefits and alternatives for the proposed anesthesia with the patient or authorized representative who has indicated his/her understanding and acceptance.     Dental advisory given  Plan Discussed with: CRNA and Anesthesiologist  Anesthesia Plan Comments:         Anesthesia Quick Evaluation

## 2020-09-05 ENCOUNTER — Encounter (HOSPITAL_BASED_OUTPATIENT_CLINIC_OR_DEPARTMENT_OTHER): Payer: Self-pay | Admitting: Obstetrics and Gynecology

## 2020-09-05 ENCOUNTER — Encounter (HOSPITAL_BASED_OUTPATIENT_CLINIC_OR_DEPARTMENT_OTHER)
Admission: RE | Disposition: A | Discharge status: Home / Self Care | Payer: Self-pay | Source: Home / Self Care | Attending: Obstetrics and Gynecology

## 2020-09-05 ENCOUNTER — Ambulatory Visit (HOSPITAL_BASED_OUTPATIENT_CLINIC_OR_DEPARTMENT_OTHER)
Admission: RE | Admit: 2020-09-05 | Discharge: 2020-09-05 | Disposition: A | Discharge status: Home / Self Care | Payer: Medicaid Other | Attending: Obstetrics and Gynecology | Admitting: Obstetrics and Gynecology

## 2020-09-05 ENCOUNTER — Ambulatory Visit (HOSPITAL_BASED_OUTPATIENT_CLINIC_OR_DEPARTMENT_OTHER): Payer: Medicaid Other | Admitting: Anesthesiology

## 2020-09-05 ENCOUNTER — Other Ambulatory Visit: Payer: Self-pay

## 2020-09-05 DIAGNOSIS — Z6838 Body mass index (BMI) 38.0-38.9, adult: Secondary | ICD-10-CM | POA: Insufficient documentation

## 2020-09-05 DIAGNOSIS — R519 Headache, unspecified: Secondary | ICD-10-CM | POA: Insufficient documentation

## 2020-09-05 DIAGNOSIS — Z833 Family history of diabetes mellitus: Secondary | ICD-10-CM | POA: Diagnosis not present

## 2020-09-05 DIAGNOSIS — E669 Obesity, unspecified: Secondary | ICD-10-CM | POA: Diagnosis not present

## 2020-09-05 DIAGNOSIS — K219 Gastro-esophageal reflux disease without esophagitis: Secondary | ICD-10-CM | POA: Diagnosis not present

## 2020-09-05 DIAGNOSIS — Z8616 Personal history of COVID-19: Secondary | ICD-10-CM | POA: Insufficient documentation

## 2020-09-05 DIAGNOSIS — D270 Benign neoplasm of right ovary: Secondary | ICD-10-CM | POA: Diagnosis not present

## 2020-09-05 DIAGNOSIS — R102 Pelvic and perineal pain: Secondary | ICD-10-CM | POA: Insufficient documentation

## 2020-09-05 DIAGNOSIS — K660 Peritoneal adhesions (postprocedural) (postinfection): Secondary | ICD-10-CM | POA: Diagnosis not present

## 2020-09-05 DIAGNOSIS — Z8249 Family history of ischemic heart disease and other diseases of the circulatory system: Secondary | ICD-10-CM | POA: Insufficient documentation

## 2020-09-05 DIAGNOSIS — J45909 Unspecified asthma, uncomplicated: Secondary | ICD-10-CM | POA: Diagnosis not present

## 2020-09-05 HISTORY — DX: Headache, unspecified: R51.9

## 2020-09-05 HISTORY — DX: Gastro-esophageal reflux disease without esophagitis: K21.9

## 2020-09-05 HISTORY — DX: Benign neoplasm, unspecified site: D36.9

## 2020-09-05 HISTORY — PX: LAPAROSCOPIC OVARIAN CYSTECTOMY: SHX6248

## 2020-09-05 LAB — CBC
HCT: 36.5 % (ref 36.0–46.0)
Hemoglobin: 11.6 g/dL — ABNORMAL LOW (ref 12.0–15.0)
MCH: 26.1 pg (ref 26.0–34.0)
MCHC: 31.8 g/dL (ref 30.0–36.0)
MCV: 82.2 fL (ref 80.0–100.0)
Platelets: 381 10*3/uL (ref 150–400)
RBC: 4.44 MIL/uL (ref 3.87–5.11)
RDW: 15.8 % — ABNORMAL HIGH (ref 11.5–15.5)
WBC: 9.6 10*3/uL (ref 4.0–10.5)
nRBC: 0 % (ref 0.0–0.2)

## 2020-09-05 LAB — TYPE AND SCREEN
ABO/RH(D): O POS
Antibody Screen: NEGATIVE

## 2020-09-05 LAB — POCT PREGNANCY, URINE: Preg Test, Ur: NEGATIVE

## 2020-09-05 SURGERY — EXCISION, CYST, OVARY, LAPAROSCOPIC
Anesthesia: General | Laterality: Right

## 2020-09-05 MED ORDER — DEXAMETHASONE SODIUM PHOSPHATE 10 MG/ML IJ SOLN
INTRAMUSCULAR | Status: DC | PRN
  Administered 2020-09-05: 8 mg via INTRAVENOUS

## 2020-09-05 MED ORDER — FENTANYL CITRATE (PF) 250 MCG/5ML IJ SOLN
INTRAMUSCULAR | Status: AC
  Filled 2020-09-05: qty 5

## 2020-09-05 MED ORDER — FENTANYL CITRATE (PF) 100 MCG/2ML IJ SOLN
INTRAMUSCULAR | Status: DC | PRN
Start: 2020-09-05 — End: 2020-09-05
  Administered 2020-09-05: 25 ug via INTRAVENOUS
  Administered 2020-09-05 (×3): 50 ug via INTRAVENOUS
  Administered 2020-09-05: 25 ug via INTRAVENOUS
  Administered 2020-09-05: 75 ug via INTRAVENOUS

## 2020-09-05 MED ORDER — LIDOCAINE HCL (CARDIAC) PF 100 MG/5ML IV SOSY
PREFILLED_SYRINGE | INTRAVENOUS | Status: DC | PRN
  Administered 2020-09-05: 60 mg via INTRAVENOUS

## 2020-09-05 MED ORDER — OXYCODONE HCL 5 MG PO TABS: 5.0000 mg | ORAL_TABLET | Freq: Once | ORAL | Status: DC | PRN

## 2020-09-05 MED ORDER — MIDAZOLAM HCL 2 MG/2ML IJ SOLN
INTRAMUSCULAR | Status: AC
  Filled 2020-09-05: qty 2

## 2020-09-05 MED ORDER — FENTANYL CITRATE (PF) 100 MCG/2ML IJ SOLN: 25.0000 ug | INTRAMUSCULAR | Status: DC | PRN

## 2020-09-05 MED ORDER — KETOROLAC TROMETHAMINE 30 MG/ML IJ SOLN
30.0000 mg | Freq: Once | INTRAMUSCULAR | Status: AC
  Administered 2020-09-05: 30 mg via INTRAVENOUS

## 2020-09-05 MED ORDER — ONDANSETRON HCL 4 MG/2ML IJ SOLN: 4.0000 mg | Freq: Four times a day (QID) | INTRAMUSCULAR | Status: DC | PRN

## 2020-09-05 MED ORDER — IBUPROFEN 800 MG PO TABS: 800.0000 mg | ORAL_TABLET | Freq: Three times a day (TID) | ORAL | Status: DC

## 2020-09-05 MED ORDER — ROCURONIUM BROMIDE 100 MG/10ML IV SOLN
INTRAVENOUS | Status: DC | PRN
  Administered 2020-09-05: 60 mg via INTRAVENOUS

## 2020-09-05 MED ORDER — PROMETHAZINE HCL 25 MG/ML IJ SOLN: 6.2500 mg | INTRAMUSCULAR | Status: DC | PRN

## 2020-09-05 MED ORDER — FENTANYL CITRATE (PF) 100 MCG/2ML IJ SOLN
INTRAMUSCULAR | Status: AC
  Filled 2020-09-05: qty 2

## 2020-09-05 MED ORDER — LACTATED RINGERS IV SOLN
INTRAVENOUS | Status: DC
  Administered 2020-09-05: 1000 mL via INTRAVENOUS

## 2020-09-05 MED ORDER — PROPOFOL 10 MG/ML IV BOLUS
INTRAVENOUS | Status: DC | PRN
  Administered 2020-09-05: 160 mg via INTRAVENOUS
  Administered 2020-09-05: 20 mg via INTRAVENOUS

## 2020-09-05 MED ORDER — ACETAMINOPHEN 500 MG PO TABS
ORAL_TABLET | ORAL | Status: AC
  Filled 2020-09-05: qty 2

## 2020-09-05 MED ORDER — ALUM & MAG HYDROXIDE-SIMETH 200-200-20 MG/5ML PO SUSP: 30.0000 mL | ORAL | Status: DC | PRN

## 2020-09-05 MED ORDER — LIDOCAINE 2% (20 MG/ML) 5 ML SYRINGE
INTRAMUSCULAR | Status: AC
  Filled 2020-09-05: qty 5

## 2020-09-05 MED ORDER — ONDANSETRON HCL 4 MG/2ML IJ SOLN
INTRAMUSCULAR | Status: DC | PRN
  Administered 2020-09-05: 4 mg via INTRAVENOUS

## 2020-09-05 MED ORDER — SUGAMMADEX SODIUM 200 MG/2ML IV SOLN
INTRAVENOUS | Status: DC | PRN
  Administered 2020-09-05: 200 mg via INTRAVENOUS

## 2020-09-05 MED ORDER — ONDANSETRON HCL 4 MG PO TABS: 4.0000 mg | ORAL_TABLET | Freq: Four times a day (QID) | ORAL | Status: DC | PRN

## 2020-09-05 MED ORDER — ACETAMINOPHEN 500 MG PO TABS
1000.0000 mg | ORAL_TABLET | ORAL | Status: AC
  Administered 2020-09-05: 1000 mg via ORAL

## 2020-09-05 MED ORDER — ACETAMINOPHEN 500 MG PO TABS: 1000.0000 mg | ORAL_TABLET | Freq: Four times a day (QID) | ORAL | Status: DC

## 2020-09-05 MED ORDER — DEXAMETHASONE SODIUM PHOSPHATE 10 MG/ML IJ SOLN
INTRAMUSCULAR | Status: AC
  Filled 2020-09-05: qty 1

## 2020-09-05 MED ORDER — MIDAZOLAM HCL 5 MG/5ML IJ SOLN
INTRAMUSCULAR | Status: DC | PRN
  Administered 2020-09-05 (×2): 1 mg via INTRAVENOUS

## 2020-09-05 MED ORDER — POVIDONE-IODINE 10 % EX SWAB: 2.0000 "application " | Freq: Once | CUTANEOUS | Status: DC

## 2020-09-05 MED ORDER — KETOROLAC TROMETHAMINE 30 MG/ML IJ SOLN
INTRAMUSCULAR | Status: AC
  Filled 2020-09-05: qty 1

## 2020-09-05 MED ORDER — OXYCODONE HCL 5 MG/5ML PO SOLN: 5.0000 mg | Freq: Once | ORAL | Status: DC | PRN

## 2020-09-05 MED ORDER — PROPOFOL 10 MG/ML IV BOLUS
INTRAVENOUS | Status: AC
  Filled 2020-09-05: qty 20

## 2020-09-05 MED ORDER — BUPIVACAINE HCL (PF) 0.25 % IJ SOLN
INTRAMUSCULAR | Status: DC | PRN
  Administered 2020-09-05: 30 mL

## 2020-09-05 MED ORDER — GLYCOPYRROLATE PF 0.2 MG/ML IJ SOSY
PREFILLED_SYRINGE | INTRAMUSCULAR | Status: AC
  Filled 2020-09-05: qty 1

## 2020-09-05 MED ORDER — LACTATED RINGERS IV SOLN: INTRAVENOUS | Status: DC

## 2020-09-05 MED ORDER — ROCURONIUM BROMIDE 10 MG/ML (PF) SYRINGE
PREFILLED_SYRINGE | INTRAVENOUS | Status: AC
  Filled 2020-09-05: qty 10

## 2020-09-05 MED ORDER — OXYCODONE HCL 5 MG PO TABS
5.0000 mg | ORAL_TABLET | ORAL | 0 refills | Status: AC | PRN
Start: 2020-09-05 — End: 2020-09-12

## 2020-09-05 MED ORDER — IBUPROFEN 600 MG PO TABS: 600.0000 mg | ORAL_TABLET | Freq: Four times a day (QID) | ORAL | 1 refills | Status: DC | PRN

## 2020-09-05 MED ORDER — SODIUM CHLORIDE 0.9 % IR SOLN
Status: DC | PRN
  Administered 2020-09-05: 3000 mL

## 2020-09-05 MED ORDER — OXYCODONE HCL 5 MG PO TABS: 5.0000 mg | ORAL_TABLET | ORAL | Status: DC | PRN

## 2020-09-05 MED ORDER — ONDANSETRON HCL 4 MG/2ML IJ SOLN
INTRAMUSCULAR | Status: AC
  Filled 2020-09-05: qty 2

## 2020-09-05 MED ORDER — GLYCOPYRROLATE 0.2 MG/ML IJ SOLN
INTRAMUSCULAR | Status: DC | PRN
  Administered 2020-09-05 (×2): .1 mg via INTRAVENOUS

## 2020-09-05 SURGICAL SUPPLY — 36 items
ADH SKN CLS APL DERMABOND .7 (GAUZE/BANDAGES/DRESSINGS) ×1
BAG SPEC RTRVL LRG 6X4 10 (ENDOMECHANICALS) ×1
CABLE HIGH FREQUENCY MONO STRZ (ELECTRODE) IMPLANT
CATH ROBINSON RED A/P 16FR (CATHETERS) ×3 IMPLANT
COVER MAYO STAND STRL (DRAPES) ×3 IMPLANT
COVER WAND RF STERILE (DRAPES) ×3 IMPLANT
DECANTER SPIKE VIAL GLASS SM (MISCELLANEOUS) ×3 IMPLANT
DERMABOND ADVANCED (GAUZE/BANDAGES/DRESSINGS) ×2
DERMABOND ADVANCED .7 DNX12 (GAUZE/BANDAGES/DRESSINGS) ×1 IMPLANT
DRSG OPSITE POSTOP 3X4 (GAUZE/BANDAGES/DRESSINGS) ×3 IMPLANT
DURAPREP 26ML APPLICATOR (WOUND CARE) ×3 IMPLANT
GAUZE 4X4 16PLY RFD (DISPOSABLE) ×3 IMPLANT
GLOVE BIO SURGEON STRL SZ 6.5 (GLOVE) ×4 IMPLANT
GLOVE BIO SURGEONS STRL SZ 6.5 (GLOVE) ×2
GOWN STRL REUS W/TWL LRG LVL3 (GOWN DISPOSABLE) ×9 IMPLANT
IV NS IRRIG 3000ML ARTHROMATIC (IV SOLUTION) ×3 IMPLANT
MANIPULATOR UTERINE 7CM CLEARV (MISCELLANEOUS) ×3 IMPLANT
NS IRRIG 1000ML POUR BTL (IV SOLUTION) ×3 IMPLANT
PACK LAPAROSCOPY BASIN (CUSTOM PROCEDURE TRAY) ×3 IMPLANT
PACK TRENDGUARD 450 HYBRID PRO (MISCELLANEOUS) ×1 IMPLANT
POUCH SPECIMEN RETRIEVAL 10MM (ENDOMECHANICALS) ×3 IMPLANT
PROTECTOR NERVE ULNAR (MISCELLANEOUS) ×6 IMPLANT
SET SUCTION IRRIG HYDROSURG (IRRIGATION / IRRIGATOR) ×3 IMPLANT
SET TUBE SMOKE EVAC HIGH FLOW (TUBING) ×3 IMPLANT
SHEARS HARMONIC ACE PLUS 36CM (ENDOMECHANICALS) ×3 IMPLANT
SLEEVE XCEL OPT CAN 5 100 (ENDOMECHANICALS) ×3 IMPLANT
SOLUTION ELECTROLUBE (MISCELLANEOUS) ×3 IMPLANT
SUT VIC AB 3-0 PS2 18 (SUTURE) ×3
SUT VIC AB 3-0 PS2 18XBRD (SUTURE) ×1 IMPLANT
SUT VICRYL 0 UR6 27IN ABS (SUTURE) ×3 IMPLANT
SYSTEM CARTER THOMASON II (TROCAR) ×3 IMPLANT
TOWEL OR 17X26 10 PK STRL BLUE (TOWEL DISPOSABLE) ×3 IMPLANT
TRENDGUARD 450 HYBRID PRO PACK (MISCELLANEOUS) ×3
TROCAR BLADELESS OPT 5 100 (ENDOMECHANICALS) ×6 IMPLANT
TROCAR XCEL NON-BLD 11X100MML (ENDOMECHANICALS) ×3 IMPLANT
WARMER LAPAROSCOPE (MISCELLANEOUS) ×3 IMPLANT

## 2020-09-05 NOTE — Transfer of Care (Signed)
Immediate Anesthesia Transfer of Care Note  Patient: Jillian Bishop  Procedure(s) Performed: LAPAROSCOPIC OVARIAN CYSTECTOMY LYSIS OF ADHESISONS (Right )  Patient Location: PACU  Anesthesia Type:General  Level of Consciousness: awake, alert , oriented and patient cooperative  Airway & Oxygen Therapy: Patient Spontanous Breathing and Patient connected to face mask oxygen  Post-op Assessment: Report given to RN and Post -op Vital signs reviewed and stable  Post vital signs: Reviewed and stable  Last Vitals:  Vitals Value Taken Time  BP 124/71 09/05/20 0854  Temp 36.8 C 09/05/20 0854  Pulse 106 09/05/20 0856  Resp 20 09/05/20 0856  SpO2 99 % 09/05/20 0856  Vitals shown include unvalidated device data.  Last Pain:  Vitals:   09/05/20 0609  TempSrc: Oral      Patients Stated Pain Goal: 8 (54/62/70 3500)  Complications: No complications documented.

## 2020-09-05 NOTE — Discharge Instructions (Signed)
Call office with any concerns [phone]  DISCHARGE INSTRUCTIONS: Laparoscopy  The following instructions have been prepared to help you care for yourself upon your return home today.  Wound care:  Do not get the incision wet for the first 24 hours. The incision should be kept clean and dry.  The Band-Aids or dressings may be removed the day after surgery.  Should the incision become sore, red, and swollen after the first week, check with your doctor.  Personal hygiene:  Shower the day after your procedure.  Activity and limitations:  Do NOT drive or operate any equipment today.  Do NOT lift anything more than 15 pounds for 2-3 weeks after surgery.  Do NOT rest in bed all day.  Walking is encouraged. Walk each day, starting slowly with 5-minute walks 3 or 4 times a day. Slowly increase the length of your walks.  Walk up and down stairs slowly.  Do NOT do strenuous activities, such as golfing, playing tennis, bowling, running, biking, weight lifting, gardening, mowing, or vacuuming for 2-4 weeks. Ask your doctor when it is okay to start.  Diet: Eat a light meal as desired this evening. You may resume your usual diet tomorrow.  Return to work: This is dependent on the type of work you do. For the most part you can return to a desk job within a week of surgery. If you are more active at work, please discuss this with your doctor.  What to expect after your surgery: You may have a slight burning sensation when you urinate on the first day. You may have a very small amount of blood in the urine. Expect to have a small amount of vaginal discharge/light bleeding for 1-2 weeks. It is not unusual to have abdominal soreness and bruising for up to 2 weeks. You may be tired and need more rest for about 1 week. You may experience shoulder pain for 24-72 hours. Lying flat in bed may relieve it.  Call your doctor for any of the following:  Develop a fever of 100.4 or greater  Inability to urinate  6 hours after discharge from hospital  Severe pain not relieved by pain medications  Persistent of heavy bleeding at incision site  Redness or swelling around incision site after a week  Increasing nausea or vomiting    Post Anesthesia Home Care Instructions  Activity: Get plenty of rest for the remainder of the day. A responsible individual must stay with you for 24 hours following the procedure.  For the next 24 hours, DO NOT: -Drive a car -Operate machinery -Drink alcoholic beverages -Take any medication unless instructed by your physician -Make any legal decisions or sign important papers.  Meals: Start with liquid foods such as gelatin or soup. Progress to regular foods as tolerated. Avoid greasy, spicy, heavy foods. If nausea and/or vomiting occur, drink only clear liquids until the nausea and/or vomiting subsides. Call your physician if vomiting continues.  Special Instructions/Symptoms: Your throat may feel dry or sore from the anesthesia or the breathing tube placed in your throat during surgery. If this causes discomfort, gargle with warm salt water. The discomfort should disappear within 24 hours.  

## 2020-09-05 NOTE — Op Note (Signed)
Operative Note    Preoperative Diagnosis: Right persisitent ovarian cyst; suspected dermoid                                             Pelvic pain   Postoperative Diagnosis: Same                                               Intraabdominal adhesions   Procedure: Laparoscopic right ovarian cystectomy and lysis of adhesions   Surgeon: Mickle Mallory DO Assist : Meisinger T MD  Anesthesia: General  Fluids: LR 765ml EBL: <78ml UOP: 178ml   Findings: Approx 4cm right ovarian dermoid cyst ( hair and subcutaneous tissue); Adhesion of bowel to left anterior abdominal wall   Specimen: right ovarian dermoid cyst to pathology   Procedure Note  Consent reviewed and confirmed in periop area. Patient was taken to the operating room where general anesthesia was obtained without difficulty. She was then prepped and draped in the normal sterile fashion in the dorsal lithotomy position. An appropriate timeout was performed. A speculum was then placed within the vagina and a CLearVue manipulator placed. The bladder was emptied with a red rubber. Attention was then turned to the patient's abdomen after draping. A 5 mm infraumbilical incision was made and the optiview trocar and camera easily introduced into the abdominal cavity.Gas flow was then applied and a pneumoperitoneum obtained with approximate 3 L of CO2 gas. A large right ovarian cyst was noted on gross evaluation. A second and third 4mm trocars were placed in the patients right, then left,  lower uterine segment under visualization and a better survey done.   With patient in Trendelenburg and the uterus anteverted, a portion of bowel was noted to be adhesed to the anterior abdominal wall mildly covering up the left adnexa. This was reduced using the harmonic scalpel - hemostasis noted and visualization of the pelvis improved. The harmonic scalpel was then used to excise the ovarian cyst from the rest of the healthy ovarian tissue. Hair and  subcutaneous liquefied tissue noted. Great hemostasis appreciated. At this time the left lateral incision was extend to accomodate a size 10 trocar. An endocatch bag was then introduced and the excised cyst placed in the bag. It was easily removed through the incision. The trocar was replaced. The pelvis was copious irrigated till no residual tissue noted. At this time, all instruments were removed and pt flattened on the table.  The left incision was closed with a carter thomasen under visualization.  The right trocar was then removed under visualization as well. Pneumoperitoneum was allowed to escape and anesthesia induced 5 deep breaths to effect residual gas loss. Finally the umbilical trocar was removed. Next the incisions were closed with 4-0vicryl and dermabond. The clearvue manipulator was removed- hemostasis appreciated. Patient was then awakened and taken to the recovery room in good condition. Counts were all confirmed.

## 2020-09-05 NOTE — H&P (Signed)
Jillian Bishop is an 24 y.o.[id number] femalepresenting for laparoscopic right cystectomy, possible right oopherectomy due to dermoid cyst. Cyst was initially noted during last pregnancy in 07/2019 and persisted postpartum Pt has had intermittent abdominal pain  Pertinent Gynecological History: Menses: flow is light Bleeding: light Contraception:nexplanon DES exposure: denies Blood transfusions: none Sexually transmitted diseases: no previous history Previous GYN Procedures: DNC  Last mammogram: n/a Date:  Last pap: normal Date:  OB History: G5, P4014   Menstrual History: Menarche age: 63 Patient's last menstrual period was 05/31/2019.    Past Medical History:  Diagnosis Date  . Asthma    mild well controlled, no inhaler use since 2016  . Benign teratoma of ovary, right   . COVID-19 11/02/2019   sore throat cough, sob loss of taste and smell, cough until 03-11-2020 all symptoms resolved now  . Dermoid cyst   . GERD (gastroesophageal reflux disease)   . Headache    tension, occ migraine  . HPV (human papilloma virus) infection     Past Surgical History:  Procedure Laterality Date  . APPENDECTOMY  age 27 or 19  . DILATION AND EVACUATION N/A 11/19/2018   Procedure: DILATATION AND EVACUATION;  Surgeon: Paula Compton, MD;  Location: [facility];  Service: Gynecology;  Laterality: N/A;    Family History  Problem Relation Age of Onset  . Hypertension Mother   . Hypertension Father   . Diabetes Father   . Hypertension Sister   . Heart disease Paternal Grandmother   . Hypertension Paternal Grandmother     Social History:  reports that she has never smoked. She has never used smokeless tobacco. She reports that she does not drink alcohol and does not use drugs.  Allergies: No Known Allergies  No medications prior to admission.    Review of Systems  Constitutional: Negative for activity change, appetite change and fatigue.  Eyes: Negative for photophobia and visual disturbance.   Respiratory: Negative for chest tightness and shortness of breath.   Cardiovascular: Negative for chest pain, palpitations and leg swelling.  Gastrointestinal: Positive for abdominal pain. Negative for abdominal distention.  Genitourinary: Negative for hematuria.  Musculoskeletal: Negative for back pain.  Neurological: Negative for light-headedness and headaches.  Psychiatric/Behavioral: The patient is not nervous/anxious.     Height 5\' 4"  (1.626 m), weight 99.8 kg, last menstrual period 05/31/2019, unknown if currently breastfeeding. Physical Exam Vitals and nursing note reviewed.  Constitutional:      Appearance: Normal appearance.  Cardiovascular:     Pulses: Normal pulses.  Pulmonary:     Effort: Pulmonary effort is normal.  Abdominal:     General: Abdomen is flat.     Palpations: Abdomen is soft.  Musculoskeletal:        General: Normal range of motion.     Cervical back: Normal range of motion.  Skin:    General: Skin is warm and dry.     Capillary Refill: Capillary refill takes 2 to 3 seconds.  Neurological:     General: No focal deficit present.     Mental Status: She is alert and oriented to person, place, and time. Mental status is at baseline.  Psychiatric:        Mood and Affect: Mood normal.     No results found for this or any previous visit (from the past 24 hour(s)).  No results found.  Assessment/Plan: [id number] female here for  laparoscopic right cystectomy, possible right oopherectomy due to dermoid cyst.  -Admit for surgery -  NPO per ERAS - SCDs to OR - Procedure and post op expectations reveiwed - consent confirmed  - To OR when ready  [name] 09/05/2020, 5:06 AM

## 2020-09-05 NOTE — Anesthesia Postprocedure Evaluation (Signed)
Anesthesia Post Note  Patient: Jillian Bishop  Procedure(s) Performed: LAPAROSCOPIC OVARIAN CYSTECTOMY LYSIS OF ADHESISONS (Right )     Patient location during evaluation: PACU Anesthesia Type: General Level of consciousness: awake and alert Pain management: pain level controlled Vital Signs Assessment: post-procedure vital signs reviewed and stable Respiratory status: spontaneous breathing, nonlabored ventilation and respiratory function stable Cardiovascular status: blood pressure returned to baseline and stable Postop Assessment: no apparent nausea or vomiting Anesthetic complications: no   No complications documented.  Last Vitals:  Vitals:   09/05/20 0930 09/05/20 0951  BP: 128/64 129/64  Pulse: 99 92  Resp: 20 20  Temp: 36.5 C   SpO2: 97% 99%    Last Pain:  Vitals:   09/05/20 0930  TempSrc:   PainSc: [place]

## 2020-09-05 NOTE — Anesthesia Procedure Notes (Signed)
Procedure Name: Intubation Date/Time: 09/05/2020 7:39 AM Performed by: Garrel Ridgel, CRNA Pre-anesthesia Checklist: Patient identified, Emergency Drugs available, Suction available and Patient being monitored Patient Re-evaluated:Patient Re-evaluated prior to induction Oxygen Delivery Method: Circle system utilized Preoxygenation: Pre-oxygenation with 100% oxygen Induction Type: IV induction Ventilation: Mask ventilation without difficulty Laryngoscope Size: Mac and 3 Grade View: Grade I Tube type: Oral Tube size: 7.0 mm Number of attempts: 1 Airway Equipment and Method: Stylet and Oral airway Placement Confirmation: ETT inserted through vocal cords under direct vision,  positive ETCO2 and breath sounds checked- equal and bilateral Secured at: 22 cm Tube secured with: Tape Dental Injury: Teeth and Oropharynx as per pre-operative assessment

## 2020-09-06 ENCOUNTER — Encounter (HOSPITAL_BASED_OUTPATIENT_CLINIC_OR_DEPARTMENT_OTHER): Payer: Self-pay | Admitting: Obstetrics and Gynecology

## 2020-09-06 LAB — SURGICAL PATHOLOGY

## 2020-09-10 ENCOUNTER — Encounter: Payer: Self-pay | Admitting: Internal Medicine

## 2020-09-12 ENCOUNTER — Telehealth: Payer: Medicaid Other | Admitting: Internal Medicine

## 2020-12-07 DIAGNOSIS — Z419 Encounter for procedure for purposes other than remedying health state, unspecified: Secondary | ICD-10-CM | POA: Diagnosis not present

## 2020-12-26 ENCOUNTER — Telehealth: Payer: Medicaid Other | Admitting: Internal Medicine

## 2021-01-07 DIAGNOSIS — Z419 Encounter for procedure for purposes other than remedying health state, unspecified: Secondary | ICD-10-CM | POA: Diagnosis not present

## 2021-02-04 DIAGNOSIS — Z419 Encounter for procedure for purposes other than remedying health state, unspecified: Secondary | ICD-10-CM | POA: Diagnosis not present

## 2021-03-07 DIAGNOSIS — Z419 Encounter for procedure for purposes other than remedying health state, unspecified: Secondary | ICD-10-CM | POA: Diagnosis not present

## 2021-03-21 ENCOUNTER — Ambulatory Visit: Payer: Medicaid Other | Admitting: Family

## 2021-03-25 NOTE — Progress Notes (Signed)
Virtual Visit via Telephone Note  I connected with Jillian Bishop, on 03/26/2021 at 8:41 AM by telephone due to the COVID-19 pandemic and verified that I am speaking with the correct person using two identifiers.  Due to current restrictions/limitations of in-office visits due to the COVID-19 pandemic, this scheduled clinical appointment was converted to a telehealth visit.   Consent: I discussed the limitations, risks, security and privacy concerns of performing an evaluation and management service by telephone and the availability of in person appointments. I also discussed with the patient that there may be a patient responsible charge related to this service. The patient expressed understanding and agreed to proceed.  Location of Patient: Home  Location of Provider: [facility] [place] participating in Telemedicine visit: Shon Hale, NP Elmon Else, [place]  History of Present Illness: Jillian [place]. Bishop is a 25 year-old female who presents to establish care.   Current issues and/or concerns: Concerns for blood sugar readings at home in the 200's. Interest in weight loss options.    Past Medical History:  Diagnosis Date  . Asthma    mild well controlled, no inhaler use since 2016  . Benign teratoma of ovary, right   . COVID-19 11/02/2019   sore throat cough, sob loss of taste and smell, cough until 03-11-2020 all symptoms resolved now  . Dermoid cyst   . GERD (gastroesophageal reflux disease)   . Headache    tension, occ migraine  . HPV (human papilloma virus) infection    No Known Allergies  Current Outpatient Medications on File Prior to Visit  Medication Sig Dispense Refill  . etonogestrel (NEXPLANON) 68 MG IMPL implant 1 each by Subdermal route once. Left arm Apr 23, 2020    . Ferrous Sulfate (IRON PO) Take by mouth. (Patient not taking: Reported on 03/26/2021)    . ibuprofen (ADVIL) 600 MG tablet Take 1 tablet (600 mg  total) by mouth every 6 (six) hours as needed for moderate pain or cramping. (Patient not taking: Reported on 03/26/2021) 30 tablet 1   No current facility-administered medications on file prior to visit.    Observations/Objective: Alert and oriented x 3. Not in acute distress. Physical examination not completed as this is a telemedicine visit.  Assessment and Plan: 1. Encounter to establish care: - Patient presents today to establish care.  - Return for annual physical examination, labs, and health maintenance. Arrive fasting meaning having no for at least 8 hours prior to appointment. You may have only water or black coffee. Please take scheduled medications as normal.  Follow Up Instructions: Return for annual physical exam.   Patient was given clear instructions to go to Emergency Department or return to medical center if symptoms don't improve, worsen, or new problems develop.The patient verbalized understanding.  I discussed the assessment and treatment plan with the patient. The patient was provided an opportunity to ask questions and all were answered. The patient agreed with the plan and demonstrated an understanding of the instructions.   The patient was advised to call back or seek an in-person evaluation if the symptoms worsen or if the condition fails to improve as anticipated.    I provided 10 minutes total of non-face-to-face time during this encounter including median intraservice time, reviewing previous notes, labs, imaging, medications, management and patient verbalized understanding.    Camillia Herter, NP  [facility] [place], [place] 03/26/2021, 8:41 AM

## 2021-03-26 ENCOUNTER — Encounter: Payer: Self-pay | Admitting: Family

## 2021-03-26 ENCOUNTER — Telehealth (INDEPENDENT_AMBULATORY_CARE_PROVIDER_SITE_OTHER): Payer: Medicaid Other | Admitting: Family

## 2021-03-26 DIAGNOSIS — Z7689 Persons encountering health services in other specified circumstances: Secondary | ICD-10-CM | POA: Diagnosis not present

## 2021-03-26 NOTE — Progress Notes (Signed)
Establish care

## 2021-04-06 DIAGNOSIS — Z419 Encounter for procedure for purposes other than remedying health state, unspecified: Secondary | ICD-10-CM | POA: Diagnosis not present

## 2021-05-04 NOTE — Progress Notes (Signed)
Patient ID: Jillian Bishop, female    DOB: 04-23-1996  MRN: [id number]  CC: Annual Physical Exam  Subjective: Jillian Bishop is a 25 y.o. female who presents for annual physical exam.  Her concerns today include: diabetes screening.    Current Outpatient Medications on File Prior to Visit  Medication Sig Dispense Refill  . etonogestrel (NEXPLANON) 68 MG IMPL implant 1 each by Subdermal route once. Left arm Apr 23, 2020     No current facility-administered medications on file prior to visit.    No Known Allergies  Social History   Socioeconomic History  . Marital status: Single    Spouse name: Not on file  . Number of children: Not on file  . Years of education: Not on file  . Highest education level: Not on file  Occupational History  . Not on file  Tobacco Use  . Smoking status: Never Smoker  . Smokeless tobacco: Never Used  Vaping Use  . Vaping Use: Never used  Substance and Sexual Activity  . Alcohol use: No  . Drug use: No  . Sexual activity: Not on file  Other Topics Concern  . Not on file  Social History Narrative  . Not on file   Social Determinants of Health   Financial Resource Strain: Not on file  Food Insecurity: Not on file  Transportation Needs: Not on file  Physical Activity: Not on file  Stress: Not on file  Social Connections: Not on file  Intimate Partner Violence: Not on file    Family History  Problem Relation Age of Onset  . Hypertension Mother   . Diabetes Mother   . Hypertension Father   . Diabetes Father   . Hypertension Sister   . Heart disease Paternal Grandmother   . Hypertension Paternal Grandmother   . Diabetes Maternal Grandmother   . Hypertension Maternal Grandmother   . Diabetes Maternal Aunt   . Diabetes Sister     Past Surgical History:  Procedure Laterality Date  . APPENDECTOMY  age 4 or 61  . DILATION AND EVACUATION N/A 11/19/2018   Procedure: DILATATION AND EVACUATION;  Surgeon: Paula Compton, MD;   Location: [facility];  Service: Gynecology;  Laterality: N/A;  . LAPAROSCOPIC OVARIAN CYSTECTOMY Right 09/05/2020   Procedure: LAPAROSCOPIC OVARIAN CYSTECTOMY LYSIS OF ADHESISONS;  Surgeon: Sherlyn Hay, DO;  Location: [facility];  Service: Gynecology;  Laterality: Right;    ROS: Review of Systems Negative except as stated above  PHYSICAL EXAM: BP 112/80 (BP Location: Left Arm, Patient Position: Sitting, Cuff Size: Normal)   Pulse 85   Temp 98.4 F (36.9 C)   Resp 15   Ht 5' 4.17" (1.63 m)   Wt 209 lb (94.8 kg)   SpO2 98%   BMI 35.68 kg/m   Wt Readings from Last 3 Encounters:  05/06/21 209 lb (94.8 kg)  09/05/20 226 lb 14.4 oz (102.9 kg)  03/11/20 198 lb (89.8 kg)    Physical Exam HENT:     Head: Normocephalic and atraumatic.     Right Ear: Tympanic membrane, ear canal and external ear normal.     Left Ear: Tympanic membrane, ear canal and external ear normal.  Eyes:     Extraocular Movements: Extraocular movements intact.     Conjunctiva/sclera: Conjunctivae normal.     Pupils: Pupils are equal, round, and reactive to light.  Cardiovascular:     Rate and Rhythm: Normal rate and regular rhythm.  Pulses: Normal pulses.     Heart sounds: Normal heart sounds.  Pulmonary:     Effort: Pulmonary effort is normal.     Breath sounds: Normal breath sounds.  Chest:     Comments: Patient declined examination.  Abdominal:     General: Bowel sounds are normal.     Palpations: Abdomen is soft.  Genitourinary:    Comments: Patient declined examination.  Musculoskeletal:        General: Normal range of motion.     Cervical back: Normal range of motion and neck supple.  Skin:    General: Skin is warm and dry.     Capillary Refill: Capillary refill takes less than 2 seconds.  Neurological:     General: No focal deficit present.     Mental Status: She is alert and oriented to person, place, and time.  Psychiatric:        Mood and Affect: Mood normal.         Behavior: Behavior normal.     ASSESSMENT AND PLAN: 1. Annual physical exam: - Counseled on 150 minutes of exercise per week as tolerated, healthy eating (including decreased daily intake of saturated fats, cholesterol, added sugars, sodium), STI prevention, and routine healthcare maintenance.  2. Screening for metabolic disorder: - CMP to check kidney function, liver function, and electrolyte balance.  - Comprehensive metabolic panel  3. Screening for deficiency anemia: - CBC to screen for anemia. - CBC  4. Prediabetes: - Hemoglobin A1c 5.9% and consistent with pre-diabetes.  - Discussed the importance of healthy eating habits, low-carbohydrate diet, low-sugar diet, regular aerobic exercise (at least 150 minutes a week as tolerated) and medication compliance to achieve or maintain control of pre-diabetes. - Follow-up with primary provider in 6 months or sooner if needed.  - POCT glycosylated hemoglobin (Hb A1C)  5. Screening cholesterol level: - Lipid panel to screen for high cholesterol.  - Lipid panel  6. Thyroid disorder screen: - TSH to check thyroid function.  - TSH  7. Need for hepatitis C screening test: - Hepatitis C antibody to screen for hepatitis C.  - Hepatitis C Antibody    Patient was given the opportunity to ask questions.  Patient verbalized understanding of the plan and was able to repeat key elements of the plan. Patient was given clear instructions to go to Emergency Department or return to medical center if symptoms don't improve, worsen, or new problems develop.The patient verbalized understanding.   Orders Placed This Encounter  Procedures  . Hepatitis C Antibody  . CBC  . Comprehensive metabolic panel  . Lipid panel  . TSH  . POCT glycosylated hemoglobin (Hb A1C)    Follow-up with primary provider as scheduled.   Camillia Herter, NP

## 2021-05-06 ENCOUNTER — Encounter: Payer: Self-pay | Admitting: Family

## 2021-05-06 ENCOUNTER — Other Ambulatory Visit: Payer: Self-pay

## 2021-05-06 ENCOUNTER — Ambulatory Visit (INDEPENDENT_AMBULATORY_CARE_PROVIDER_SITE_OTHER): Payer: Medicaid Other | Admitting: Family

## 2021-05-06 VITALS — BP 112/80 | HR 85 | Temp 98.4°F | Resp 15 | Ht 64.17 in | Wt 209.0 lb

## 2021-05-06 DIAGNOSIS — Z0000 Encounter for general adult medical examination without abnormal findings: Secondary | ICD-10-CM | POA: Diagnosis not present

## 2021-05-06 DIAGNOSIS — Z13228 Encounter for screening for other metabolic disorders: Secondary | ICD-10-CM

## 2021-05-06 DIAGNOSIS — R7303 Prediabetes: Secondary | ICD-10-CM | POA: Diagnosis not present

## 2021-05-06 DIAGNOSIS — Z130 Encounter for screening for diseases of the blood and blood-forming organs and certain disorders involving the immune mechanism: Secondary | ICD-10-CM | POA: Diagnosis not present

## 2021-05-06 DIAGNOSIS — Z1329 Encounter for screening for other suspected endocrine disorder: Secondary | ICD-10-CM | POA: Diagnosis not present

## 2021-05-06 DIAGNOSIS — Z131 Encounter for screening for diabetes mellitus: Secondary | ICD-10-CM

## 2021-05-06 DIAGNOSIS — Z13220 Encounter for screening for lipoid disorders: Secondary | ICD-10-CM | POA: Diagnosis not present

## 2021-05-06 DIAGNOSIS — Z1159 Encounter for screening for other viral diseases: Secondary | ICD-10-CM

## 2021-05-06 LAB — POCT GLYCOSYLATED HEMOGLOBIN (HGB A1C): Hemoglobin A1C: 5.9 % — AB (ref 4.0–5.6)

## 2021-05-06 NOTE — Progress Notes (Signed)
Annual physical exam Pt concerned about diabetes due to family history

## 2021-05-06 NOTE — Patient Instructions (Signed)
Preventive Care 21-27 Years Old, Female Preventive care refers to lifestyle choices and visits with your health care provider that can promote health and wellness. This includes:  A yearly physical exam. This is also called an annual wellness visit.  Regular dental and eye exams.  Immunizations.  Screening for certain conditions.  Healthy lifestyle choices, such as: ? Eating a healthy diet. ? Getting regular exercise. ? Not using drugs or products that contain nicotine and tobacco. ? Limiting alcohol use. What can I expect for my preventive care visit? Physical exam Your health care provider may check your:  Height and weight. These may be used to calculate your BMI (body mass index). BMI is a measurement that tells if you are at a healthy weight.  Heart rate and blood pressure.  Body temperature.  Skin for abnormal spots. Counseling Your health care provider may ask you questions about your:  Past medical problems.  Family's medical history.  Alcohol, tobacco, and drug use.  Emotional well-being.  Home life and relationship well-being.  Sexual activity.  Diet, exercise, and sleep habits.  Work and work environment.  Access to firearms.  Method of birth control.  Menstrual cycle.  Pregnancy history. What immunizations do I need? Vaccines are usually given at various ages, according to a schedule. Your health care provider will recommend vaccines for you based on your age, medical history, and lifestyle or other factors, such as travel or where you work.   What tests do I need? Blood tests  Lipid and cholesterol levels. These may be checked every 5 years starting at age 20.  Hepatitis C test.  Hepatitis B test. Screening  Diabetes screening. This is done by checking your blood sugar (glucose) after you have not eaten for a while (fasting).  STD (sexually transmitted disease) testing, if you are at risk.  BRCA-related cancer screening. This may be  done if you have a family history of breast, ovarian, tubal, or peritoneal cancers.  Pelvic exam and Pap test. This may be done every 3 years starting at age 21. Starting at age 30, this may be done every 5 years if you have a Pap test in combination with an HPV test. Talk with your health care provider about your test results, treatment options, and if necessary, the need for more tests.   Follow these instructions at home: Eating and drinking  Eat a healthy diet that includes fresh fruits and vegetables, whole grains, lean protein, and low-fat dairy products.  Take vitamin and mineral supplements as recommended by your health care provider.  Do not drink alcohol if: ? Your health care provider tells you not to drink. ? You are pregnant, may be pregnant, or are planning to become pregnant.  If you drink alcohol: ? Limit how much you have to 0-1 drink a day. ? Be aware of how much alcohol is in your drink. In the [place] one drink equals one 12 oz bottle of beer (355 mL), one 5 oz glass of wine (148 mL), or one 1 oz glass of hard liquor (44 mL).   Lifestyle  Take daily care of your teeth and gums. Brush your teeth every morning and night with fluoride toothpaste. Floss one time each day.  Stay active. Exercise for at least 30 minutes 5 or more days each week.  Do not use any products that contain nicotine or tobacco, such as cigarettes, e-cigarettes, and chewing tobacco. If you need help quitting, ask your health care provider.  Do not   use drugs.  If you are sexually active, practice safe sex. Use a condom or other form of protection to prevent STIs (sexually transmitted infections).  If you do not wish to become pregnant, use a form of birth control. If you plan to become pregnant, see your health care provider for a prepregnancy visit.  Find healthy ways to cope with stress, such as: ? Meditation, yoga, or listening to music. ? Journaling. ? Talking to a trusted  person. ? Spending time with friends and family. Safety  Always wear your seat belt while driving or riding in a vehicle.  Do not drive: ? If you have been drinking alcohol. Do not ride with someone who has been drinking. ? When you are tired or distracted. ? While texting.  Wear a helmet and other protective equipment during sports activities.  If you have firearms in your house, make sure you follow all gun safety procedures.  Seek help if you have been physically or sexually abused. What's next?  Go to your health care provider once a year for an annual wellness visit.  Ask your health care provider how often you should have your eyes and teeth checked.  Stay up to date on all vaccines. This information is not intended to replace advice given to you by your health care provider. Make sure you discuss any questions you have with your health care provider. Document Revised: 07/21/2020 Document Reviewed: 08/04/2018 Elsevier Patient Education  2021 Elsevier Inc.  

## 2021-05-07 ENCOUNTER — Encounter: Payer: Self-pay | Admitting: Family

## 2021-05-07 LAB — CBC
Hematocrit: 40.6 % (ref 34.0–46.6)
Hemoglobin: 12.9 g/dL (ref 11.1–15.9)
MCH: 25.5 pg — ABNORMAL LOW (ref 26.6–33.0)
MCHC: 31.8 g/dL (ref 31.5–35.7)
MCV: 80 fL (ref 79–97)
Platelets: 392 10*3/uL (ref 150–450)
RBC: 5.05 x10E6/uL (ref 3.77–5.28)
RDW: 13.8 % (ref 11.7–15.4)
WBC: 10.2 10*3/uL (ref 3.4–10.8)

## 2021-05-07 LAB — COMPREHENSIVE METABOLIC PANEL
ALT: 19 IU/L (ref 0–32)
AST: 15 IU/L (ref 0–40)
Albumin/Globulin Ratio: 1.6 (ref 1.2–2.2)
Albumin: 4.5 g/dL (ref 3.9–5.0)
Alkaline Phosphatase: 89 IU/L (ref 44–121)
BUN/Creatinine Ratio: 15 (ref 9–23)
BUN: 9 mg/dL (ref 6–20)
Bilirubin Total: <0.2 mg/dL (ref 0.0–1.2)
CO2: 22 mmol/L (ref 20–29)
Calcium: 9.3 mg/dL (ref 8.7–10.2)
Chloride: 102 mmol/L (ref 96–106)
Creatinine, Ser: 0.61 mg/dL (ref 0.57–1.00)
Globulin, Total: 2.8 g/dL (ref 1.5–4.5)
Glucose: 65 mg/dL (ref 65–99)
Potassium: 4.1 mmol/L (ref 3.5–5.2)
Sodium: 138 mmol/L (ref 134–144)
Total Protein: 7.3 g/dL (ref 6.0–8.5)
eGFR: 126 mL/min/{1.73_m2} (ref >59)

## 2021-05-07 LAB — LIPID PANEL
Chol/HDL Ratio: 3.8 ratio (ref 0.0–4.4)
Cholesterol, Total: 154 mg/dL (ref 100–199)
HDL: 41 mg/dL (ref >39)
LDL Chol Calc (NIH): 101 mg/dL — ABNORMAL HIGH (ref 0–99)
Triglycerides: 56 mg/dL (ref 0–149)
VLDL Cholesterol Cal: 12 mg/dL (ref 5–40)

## 2021-05-07 LAB — TSH: TSH: 1.72 u[IU]/mL (ref 0.450–4.500)

## 2021-05-07 LAB — HEPATITIS C ANTIBODY: Hep C Virus Ab: <0.1 s/co ratio (ref 0.0–0.9)

## 2021-05-07 NOTE — Progress Notes (Signed)
Kidney function normal.   Liver function normal.   Thyroid function normal.   Hepatitis C negative.   Mild anemia. Reminder to include green leafy vegetables in diet to maintain iron supply.  Cholesterol mildly higher than expected. High cholesterol may increase risk of heart attack and/or stroke. Consider eating more fruits, vegetables, and lean baked meats such as chicken or fish. Moderate intensity exercise at least 150 minutes as tolerated per week may help as well. Encouraged to have rechecked in 3 to 6 months.   Pre-diabetes and plan discussed in office with patient.

## 2021-05-08 ENCOUNTER — Emergency Department (HOSPITAL_COMMUNITY)
Admission: EM | Admit: 2021-05-08 | Discharge: 2021-05-08 | Disposition: A | Discharge status: Home / Self Care | Payer: Medicaid Other | Attending: Emergency Medicine | Admitting: Emergency Medicine

## 2021-05-08 ENCOUNTER — Encounter (HOSPITAL_COMMUNITY): Payer: Self-pay

## 2021-05-08 ENCOUNTER — Emergency Department (HOSPITAL_COMMUNITY): Payer: Medicaid Other

## 2021-05-08 DIAGNOSIS — Z8543 Personal history of malignant neoplasm of ovary: Secondary | ICD-10-CM | POA: Insufficient documentation

## 2021-05-08 DIAGNOSIS — M79642 Pain in left hand: Secondary | ICD-10-CM | POA: Diagnosis not present

## 2021-05-08 DIAGNOSIS — J45909 Unspecified asthma, uncomplicated: Secondary | ICD-10-CM | POA: Insufficient documentation

## 2021-05-08 DIAGNOSIS — Z8616 Personal history of COVID-19: Secondary | ICD-10-CM | POA: Diagnosis not present

## 2021-05-08 MED ORDER — IBUPROFEN 800 MG PO TABS
800.0000 mg | ORAL_TABLET | Freq: Once | ORAL | Status: AC
  Administered 2021-05-08: 800 mg via ORAL
  Filled 2021-05-08: qty 2

## 2021-05-08 MED ORDER — IBUPROFEN 800 MG PO TABS: 800.0000 mg | ORAL_TABLET | Freq: Three times a day (TID) | ORAL | 0 refills | Status: DC

## 2021-05-08 NOTE — ED Triage Notes (Signed)
Pt states that she was playing with her husband yesterday and hurt her L hand. No swelling noted

## 2021-05-08 NOTE — ED Provider Notes (Signed)
Emergency Medicine Provider Triage Evaluation Note  Jillian Bishop , a 25 y.o. female  was evaluated in triage.  Pt complains of left hand pain.  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 She tried having it wrapped by and RN without much success.  Review of Systems  Positive: Joint pain Negative: Open wounds  Physical Exam  BP (!) 137/59 (BP Location: Right Arm)   Pulse 85   Temp 98.5 F (36.9 C) (Oral)   Resp 16   SpO2 100%  Gen:   Awake, no distress   Resp:  Normal effort  MSK:   Moves left hand with difficulty Other:    Medical Decision Making  Medically screening exam initiated at 3:19 AM.  Appropriate orders placed.  [name] was informed that the remainder of the evaluation will be completed by another provider, this initial triage assessment does not replace that evaluation, and the importance of remaining in the ED until their evaluation is complete.     Montine Circle, PA-C 05/08/21 Julien Nordmann    Veryl Speak, MD 05/08/21 [phone]

## 2021-05-08 NOTE — ED Provider Notes (Signed)
[facility] EMERGENCY DEPARTMENT Provider Note   CSN: [id number] Arrival date & time: 05/08/21  0310     History Chief Complaint  Patient presents with  . Hand Pain    Jillian Bishop is a 25 y.o. female.  The history is provided by the patient. No language interpreter was used.  Jillian Bishop , a 25 y.o. female  was evaluated in triage.  Pt complains of left hand pain.  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 She tried having it wrapped by and RN without much success.    Past Medical History:  Diagnosis Date  . Asthma    mild well controlled, no inhaler use since 2016  . Benign teratoma of ovary, right   . COVID-19 11/02/2019   sore throat cough, sob loss of taste and smell, cough until 03-11-2020 all symptoms resolved now  . Dermoid cyst   . GERD (gastroesophageal reflux disease)   . Headache    tension, occ migraine  . HPV (human papilloma virus) infection     There are no problems to display for this patient.   Past Surgical History:  Procedure Laterality Date  . APPENDECTOMY  age 89 or 52  . DILATION AND EVACUATION N/A 11/19/2018   Procedure: DILATATION AND EVACUATION;  Surgeon: Paula Compton, MD;  Location: [facility];  Service: Gynecology;  Laterality: N/A;  . LAPAROSCOPIC OVARIAN CYSTECTOMY Right 09/05/2020   Procedure: LAPAROSCOPIC OVARIAN CYSTECTOMY LYSIS OF ADHESISONS;  Surgeon: Sherlyn Hay, DO;  Location: [facility];  Service: Gynecology;  Laterality: Right;     OB History    Gravida  5   Para  4   Term  4   Preterm      AB  1   Living  4     SAB  1   IAB      Ectopic      Multiple  0   Live Births  4           Family History  Problem Relation Age of Onset  . Hypertension Mother   . Diabetes Mother   . Hypertension Father   . Diabetes Father   .  Hypertension Sister   . Heart disease Paternal Grandmother   . Hypertension Paternal Grandmother   . Diabetes Maternal Grandmother   . Hypertension Maternal Grandmother   . Diabetes Maternal Aunt   . Diabetes Sister     Social History   Tobacco Use  . Smoking status: Never Smoker  . Smokeless tobacco: Never Used  Vaping Use  . Vaping Use: Never used  Substance Use Topics  . Alcohol use: No  . Drug use: No    Home Medications Prior to Admission medications   Medication Sig Start Date End Date Taking? Authorizing Provider  ibuprofen (ADVIL) 800 MG tablet Take 1 tablet (800 mg total) by mouth 3 (three) times daily. 05/08/21  Yes Montine Circle, PA-C  etonogestrel (NEXPLANON) 68 MG IMPL implant 1 each by Subdermal route once. Left arm Apr 23, 2020    [provider]    Allergies    Patient has no known allergies.  Review of Systems   Review of Systems  Musculoskeletal: Positive for arthralgias and joint swelling.  Neurological: Negative for weakness and numbness.    Physical Exam Updated Vital Signs  BP (!) 137/59 (BP Location: Right Arm)   Pulse 85   Temp 98.5 F (36.9 C) (Oral)   Resp 16   SpO2 100%   Physical Exam Nursing note and vitals reviewed.  Constitutional: Pt appears well-developed and well-nourished. No distress.  HENT:  Head: Normocephalic and atraumatic.  Eyes: Conjunctivae are normal.  Neck: Normal range of motion.  Cardiovascular: Normal rate, regular rhythm. Intact distal pulses.   Capillary refill < 3 sec.  Pulmonary/Chest: Effort normal and breath sounds normal.  Musculoskeletal:  Left hand pain Pt exhibits TTP, no deformity or significant swelling.   ROM: limited by pain  Strength: deferred  Neurological: Pt  is alert. Coordination normal.  Sensation: 5/5 Skin: Skin is warm and dry. Pt is not diaphoretic.  No evidence of open wound or skin tenting Psychiatric: Pt has a normal mood and affect.    ED Results / Procedures /  Treatments   Labs (all labs ordered are listed, but only abnormal results are displayed) Labs Reviewed - No data to display  EKG None  Radiology DG Wrist Complete Left  Result Date: 05/08/2021 CLINICAL DATA:  Fall with swelling EXAM: LEFT WRIST - COMPLETE 3+ VIEW COMPARISON:  None. FINDINGS: There is no evidence of fracture or dislocation. There is no evidence of arthropathy or other focal bone abnormality. Soft tissues are unremarkable. IMPRESSION: Negative. Electronically Signed   By: Donavan Foil M.D.   On: 05/08/2021 03:39   DG Hand Complete Left  Result Date: 05/08/2021 CLINICAL DATA:  Fall with swelling EXAM: LEFT HAND - COMPLETE 3+ VIEW COMPARISON:  None. FINDINGS: There is no evidence of fracture or dislocation. There is no evidence of arthropathy or other focal bone abnormality. Soft tissues are unremarkable. IMPRESSION: Negative. Electronically Signed   By: Donavan Foil M.D.   On: 05/08/2021 03:38    Procedures Procedures   Medications Ordered in ED Medications  ibuprofen (ADVIL) tablet 800 mg (has no administration in time range)    ED Course  I have reviewed the triage vital signs and the nursing notes.  Pertinent labs & imaging results that were available during my care of the patient were reviewed by me and considered in my medical decision making (see chart for details).    MDM Rules/Calculators/A&P                          Patient presents with injury to left hand.  DDx includes, fracture, strain, or sprain.  Consultants: none  Plain films reveal no fx or dislocation.  Pt advised to follow up with PCP and/or orthopedics. Patient given splint and ibuprofen while in ED, conservative therapy such as RICE recommended and discussed.   Patient will be discharged home & is agreeable with above plan. Returns precautions discussed. Pt appears safe for discharge.  Final Clinical Impression(s) / ED Diagnoses Final diagnoses:  Left hand pain    Rx / DC Orders ED  Discharge Orders         Ordered    ibuprofen (ADVIL) 800 MG tablet  3 times daily        05/08/21 0353           Montine Circle, PA-C 05/08/21 0355    Veryl Speak, MD 05/08/21 [phone]

## 2021-05-23 ENCOUNTER — Encounter: Payer: Self-pay | Admitting: Family

## 2021-06-05 ENCOUNTER — Ambulatory Visit: Payer: Medicaid Other | Admitting: Physician Assistant

## 2021-06-10 NOTE — Telephone Encounter (Signed)
Pt contacted rescheduled for earlier appt

## 2021-06-10 NOTE — Progress Notes (Signed)
Patient ID: CLOA BUSHONG, female    DOB: 09-16-1996  MRN: [id number]  CC: Weight Gain  Subjective: Jillian Bishop is a 25 y.o. female who presents for weight gain.   Her concerns today include:   WEIGHT GAIN: Making improvements in diet, eating less fast foods. Recently drinking more sodas. Planning to cut back on sodas soon. Exercising at least 3 times weekly (walking). 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 Would like to begin Phentermine. Not ready for injectable medications because of concern for needles. Requesting multivitamin.  Current Outpatient Medications on File Prior to Visit  Medication Sig Dispense Refill   etonogestrel (NEXPLANON) 68 MG IMPL implant 1 each by Subdermal route once. Left arm Apr 23, 2020     ibuprofen (ADVIL) 600 MG tablet ibuprofen 600 mg tablet  TAKE 1 TABLET BY MOUTH EVERY 6 HOURS AS NEEDED FOR MODERATE PAIN OR CRAMPING     No current facility-administered medications on file prior to visit.    No Known Allergies  Social History   Socioeconomic History   Marital status: Single    Spouse name: Not on file   Number of children: Not on file   Years of education: Not on file   Highest education level: Not on file  Occupational History   Not on file  Tobacco Use   Smoking status: Never   Smokeless tobacco: Never  Vaping Use   Vaping Use: Never used  Substance and Sexual Activity   Alcohol use: No   Drug use: No   Sexual activity: Not on file  Other Topics Concern   Not on file  Social History Narrative   Not on file   Social Determinants of Health   Financial Resource Strain: Not on file  Food Insecurity: Not on file  Transportation Needs: Not on file  Physical Activity: Not on file  Stress: Not on file  Social Connections: Not on file  Intimate Partner Violence: Not on file    Family History  Problem Relation Age of Onset   Hypertension Mother    Diabetes Mother    Hypertension Father     Diabetes Father    Hypertension Sister    Heart disease Paternal Grandmother    Hypertension Paternal Grandmother    Diabetes Maternal Grandmother    Hypertension Maternal Grandmother    Diabetes Maternal Aunt    Diabetes Sister     Past Surgical History:  Procedure Laterality Date   APPENDECTOMY  age 1 or [place] N/A 11/19/2018   Procedure: DILATATION AND EVACUATION;  Surgeon: Paula Compton, MD;  Location: [facility];  Service: Gynecology;  Laterality: N/A;   LAPAROSCOPIC OVARIAN CYSTECTOMY Right 09/05/2020   Procedure: LAPAROSCOPIC OVARIAN CYSTECTOMY LYSIS OF ADHESISONS;  Surgeon: Sherlyn Hay, DO;  Location: [facility];  Service: Gynecology;  Laterality: Right;    ROS: Review of Systems Negative except as stated above  PHYSICAL EXAM: BP 110/75 (BP Location: Left Arm, Patient Position: Sitting, Cuff Size: Normal)   Pulse 73   Temp 98.4 F (36.9 C)   Resp 16   Ht 5' 4.17" (1.63 m)   Wt 206 lb 3.2 oz (93.5 kg)   SpO2 98%   BMI 35.20 kg/m   Physical Exam HENT:     Head: Normocephalic and atraumatic.  Eyes:     Extraocular Movements: Extraocular movements intact.     Conjunctiva/sclera: Conjunctivae normal.  Pupils: Pupils are equal, round, and reactive to light.  Cardiovascular:     Rate and Rhythm: Normal rate and regular rhythm.  Pulmonary:     Effort: Pulmonary effort is normal.     Breath sounds: Normal breath sounds.  Musculoskeletal:     Cervical back: Normal range of motion and neck supple.  Neurological:     General: No focal deficit present.     Mental Status: She is alert and oriented to person, place, and time.  Psychiatric:        Mood and Affect: Mood normal.        Behavior: Behavior normal.    ASSESSMENT AND PLAN: 1. Encounter for weight management: 2. Class 2 obesity without serious comorbidity with body mass index (BMI) of 35.0 to 35.9 in adult, unspecified obesity type: - Begin Phentermine  low-dose of entire mean to try curb appetite.  Encouraged her to keep an eye on her blood pressure.  Advised to let me know if she has any palpitations. - Begin Multiple Vitamins-Minerals as prescribed.  - I did check the [facility] prescription drug database and found no frequent prescribers of opiates or evidence of aberrant behavior. - Follow-up with primary provider in 4 weeks or sooner if needed.  - phentermine 15 MG capsule; Take 1 capsule (15 mg total) by mouth every morning.  Dispense: 30 capsule; Refill: 0 - Multiple Vitamins-Minerals (MULTIVITAMIN) tablet; Take 1 tablet by mouth daily.  Dispense: 90 tablet; Refill: 0    Patient was given the opportunity to ask questions.  Patient verbalized understanding of the plan and was able to repeat key elements of the plan. Patient was given clear instructions to go to Emergency Department or return to medical center if symptoms don't improve, worsen, or new problems develop.The patient verbalized understanding.   Requested Prescriptions   Signed Prescriptions Disp Refills   phentermine 15 MG capsule 30 capsule 0    Sig: Take 1 capsule (15 mg total) by mouth every morning.   Multiple Vitamins-Minerals (MULTIVITAMIN) tablet 90 tablet 0    Sig: Take 1 tablet by mouth daily.    Return in about 4 weeks (around 07/09/2021) for Telephone Visit weight management .  Camillia Herter, NP

## 2021-06-11 ENCOUNTER — Ambulatory Visit: Payer: Medicaid Other | Admitting: Family

## 2021-06-11 ENCOUNTER — Encounter: Payer: Self-pay | Admitting: Family

## 2021-06-11 ENCOUNTER — Ambulatory Visit (INDEPENDENT_AMBULATORY_CARE_PROVIDER_SITE_OTHER): Payer: Medicaid Other | Admitting: Family

## 2021-06-11 ENCOUNTER — Other Ambulatory Visit: Payer: Self-pay

## 2021-06-11 VITALS — BP 110/75 | HR 73 | Temp 98.4°F | Resp 16 | Ht 64.17 in | Wt 206.2 lb

## 2021-06-11 DIAGNOSIS — E669 Obesity, unspecified: Secondary | ICD-10-CM

## 2021-06-11 DIAGNOSIS — Z7689 Persons encountering health services in other specified circumstances: Secondary | ICD-10-CM

## 2021-06-11 DIAGNOSIS — Z6835 Body mass index (BMI) 35.0-35.9, adult: Secondary | ICD-10-CM | POA: Diagnosis not present

## 2021-06-11 MED ORDER — THERA VITAL M PO TABS: 1.0000 | ORAL_TABLET | Freq: Every day | ORAL | 0 refills | Status: AC

## 2021-06-11 MED ORDER — PHENTERMINE HCL 15 MG PO CAPS: 15.0000 mg | ORAL_CAPSULE | ORAL | 0 refills | Status: DC

## 2021-06-11 NOTE — Progress Notes (Signed)
Pt presents to discuss weight loss medication options

## 2021-06-11 NOTE — Patient Instructions (Signed)
Phentermine tablets or capsules What is this medication? PHENTERMINE (FEN ter meen) decreases your appetite. It is used with a reducedcalorie diet and exercise to help you lose weight. This medicine may be used for other purposes; ask your health care provider orpharmacist if you have questions. COMMON BRAND NAME(S): Adipex-P, Atti-Plex P, Atti-Plex P Spansule, Fastin,Lomaira, Pro-Fast, Tara-8 What should I tell my care team before I take this medication? They need to know if you have any of these conditions: agitation or nervousness diabetes glaucoma heart disease high blood pressure history of drug abuse or addiction history of stroke kidney disease lung disease called Primary Pulmonary Hypertension (PPH) taken an MAOI like Carbex, Eldepryl, Marplan, Nardil, or Parnate in last 14 days taking stimulant medicines for attention disorders, weight loss, or to stay awake thyroid disease an unusual or allergic reaction to phentermine, other medicines, foods, dyes, or preservatives pregnant or trying to get pregnant breast-feeding How should I use this medication? Take this medicine by mouth with a glass of water. Follow the directions on the prescription label. Take your medicine at regular intervals. Do not take itmore often than directed. Do not stop taking except on your doctor's advice. Talk to your pediatrician regarding the use of this medicine in children. While this drug may be prescribed for children 17 years or older for selectedconditions, precautions do apply. Overdosage: If you think you have taken too much of this medicine contact apoison control center or emergency room at once. NOTE: This medicine is only for you. Do not share this medicine with others. What if I miss a dose? If you miss a dose, take it as soon as you can. If it is almost time for yournext dose, take only that dose. Do not take double or extra doses. What may interact with this medication? Do not take this  medicine with any of the following medications: MAOIs like Carbex, Eldepryl, Marplan, Nardil, and Parnate This medicine may also interact with the following medications: alcohol certain medicines for depression, anxiety, or psychotic disorders certain medicines for high blood pressure linezolid medicines for colds or breathing difficulties like pseudoephedrine or phenylephrine medicines for diabetes sibutramine stimulant medicines for attention disorders, weight loss, or to stay awake This list may not describe all possible interactions. Give your health care provider a list of all the medicines, herbs, non-prescription drugs, or dietary supplements you use. Also tell them if you smoke, drink alcohol, or use illegaldrugs. Some items may interact with your medicine. What should I watch for while using this medication? Visit your doctor or health care provider for regular checks on your progress. Do not stop taking except on your health care provider's advice. You may develop a severe reaction. Your health care provider will tell you how muchmedicine to take. Do not take this medicine close to bedtime. It may prevent you from sleeping. You may get drowsy or dizzy. Do not drive, use machinery, or do anything that needs mental alertness until you know how this medicine affects you. Do not stand or sit up quickly, especially if you are an older patient. This reduces the risk of dizzy or fainting spells. Alcohol may increase dizziness anddrowsiness. Avoid alcoholic drinks. This medicine may affect blood sugar levels. Ask your healthcare provider ifchanges in diet or medicines are needed if you have diabetes. Women should inform their health care provider if they wish to become pregnant or think they might be pregnant. Losing weight while pregnant is not advised and may cause harm to the  unborn child. Talk to your health care provider formore information. What side effects may I notice from receiving  this medication? Side effects that you should report to your doctor or health care professionalas soon as possible: allergic reactions like skin rash, itching or hives, swelling of the face, lips, or tongue breathing problems changes in emotions or moods changes in vision chest pain or chest tightness fast, irregular heartbeat feeling faint or lightheaded increased blood pressure irritable restlessness tremors seizures signs and symptoms of a stroke like changes in vision; confusion; trouble speaking or understanding; severe headaches; sudden numbness or weakness of the face, arm or leg; trouble walking; dizziness; loss of balance or coordination unusually weak or tired Side effects that usually do not require medical attention (report to yourdoctor or health care professional if they continue or are bothersome): changes in taste constipation or diarrhea dizziness dry mouth headache trouble sleeping upset stomach This list may not describe all possible side effects. Call your doctor for medical advice about side effects. You may report side effects to FDA at1-800-FDA-1088. Where should I keep my medication? Keep out of the reach of children. This medicine can be abused. Keep your medicine in a safe place to protect it from theft. Do not share this medicine with anyone. Selling or giving away this medicine is dangerous and against thelaw. This medicine may cause harm and death if it is taken by other adults, children, or pets. Return medicine that has not been used to an official disposal site. Contact the DEA at 720-641-0240 or your city/county government to find a site. If you cannot return the medicine, mix any unused medicine with a substance like cat litter or coffee grounds. Then throw the medicine away in a sealed container like a sealed bag or coffee can with a lid. Do not use themedicine after the expiration date. Store at room temperature between 20 and 25 degrees C (68 and 77  degrees F).Keep container tightly closed. NOTE: This sheet is a summary. It may not cover all possible information. If you have questions about this medicine, talk to your doctor, pharmacist, orhealth care provider.  2022 Elsevier/Gold Standard (2019-09-29 12:54:20)

## 2021-06-12 ENCOUNTER — Other Ambulatory Visit: Payer: Self-pay | Admitting: Family

## 2021-06-12 ENCOUNTER — Encounter: Payer: Self-pay | Admitting: Family

## 2021-06-22 NOTE — Progress Notes (Signed)
Virtual Visit via Telephone Note  I connected with Jillian Bishop, on 06/23/2021 at 2:45 PM by telephone due to the COVID-19 pandemic and verified that I am speaking with the correct person using two identifiers.  Due to current restrictions/limitations of in-office visits due to the COVID-19 pandemic, this scheduled clinical appointment was converted to a telehealth visit.   Consent: I discussed the limitations, risks, security and privacy concerns of performing an evaluation and management service by telephone and the availability of in person appointments. I also discussed with the patient that there may be a patient responsible charge related to this service. The patient expressed understanding and agreed to proceed.   Location of Patient: Home  Location of Provider:  [facility] at [facility] participating in Telemedicine visit: Shon Hale, NP [place]mon Else, [place]  History of Present Illness: Jillian Bishop. Jillian Bishop is a 25 year-old female who presents for weight management follow-up:  06/11/2021: - Begin Phentermine low-dose of entire mean to try curb appetite.  Encouraged her to keep an eye on her blood pressure.  Advised to let me know if she has any palpitations. - Begin Multiple Vitamins-Minerals as prescribed. - Follow-up with primary provider in 4 weeks or sooner if needed.   06/23/2021: Taking Phentermine and doing ok. However, experiencing heart palpitations intermittently. Prefers an injection instead.   Past Medical History:  Diagnosis Date   Asthma    mild well controlled, no inhaler use since 2016   Benign teratoma of ovary, right    COVID-19 11/02/2019   sore throat cough, sob loss of taste and smell, cough until 03-11-2020 all symptoms resolved now   Dermoid cyst    GERD (gastroesophageal reflux disease)    Headache    tension, occ migraine   HPV (human papilloma virus) infection    No Known Allergies  Current Outpatient  Medications on File Prior to Visit  Medication Sig Dispense Refill   etonogestrel (NEXPLANON) 68 MG IMPL implant 1 each by Subdermal route once. Left arm Apr 23, 2020     ibuprofen (ADVIL) 600 MG tablet ibuprofen 600 mg tablet  TAKE 1 TABLET BY MOUTH EVERY 6 HOURS AS NEEDED FOR MODERATE PAIN OR CRAMPING     Multiple Vitamins-Minerals (MULTIVITAMIN) tablet Take 1 tablet by mouth daily. 90 tablet 0   phentermine 15 MG capsule Take 1 capsule (15 mg total) by mouth every morning. 30 capsule 0   No current facility-administered medications on file prior to visit.    Observations/Objective: Alert and oriented x 3. Not in acute distress. Physical examination not completed as this is a telemedicine visit.  Assessment and Plan: 1. Encounter for weight management: 2. Class 2 obesity without serious comorbidity with body mass index (BMI) of 35.0 to 35.9 in adult, unspecified obesity type: - Phentermine discontinued per patient preference.  - Begin Semaglutide as prescribed. - Counseled on low-sodium, DASH diet, and 150 minutes of moderate intensity exercise per week as tolerated. Discussed medication adverse effects and to notify primary provider should any occur. Patient verbalized understanding. - Follow-up with primary provider in 4 weeks or sooner if needed.  - Semaglutide,0.25 or 0.5MG /DOS, (OZEMPIC, 0.25 OR 0.5 MG/DOSE,) 2 MG/1.5ML SOPN; Inject 0.5 mg into the skin once a week.  Dispense: 1.5 mL; Refill: 0 - Insulin Pen Needle (PEN NEEDLES) 31G X 8 MM MISC; UAD  Dispense: 100 each; Refill: 1   Follow Up Instructions: Follow-up with primary provider in 4 weeks or  sooner if needed.   Patient was given clear instructions to go to Emergency Department or return to medical center if symptoms don't improve, worsen, or new problems develop.The patient verbalized understanding.  I discussed the assessment and treatment plan with the patient. The patient was provided an opportunity to ask questions  and all were answered. The patient agreed with the plan and demonstrated an understanding of the instructions.   The patient was advised to call back or seek an in-person evaluation if the symptoms worsen or if the condition fails to improve as anticipated.    I provided 10 minutes total of non-face-to-face time during this encounter.   Camillia Herter, NP  [facility] [place], [place] 06/23/2021, 2:45 PM

## 2021-06-23 ENCOUNTER — Telehealth (INDEPENDENT_AMBULATORY_CARE_PROVIDER_SITE_OTHER): Payer: Medicaid Other | Admitting: Family

## 2021-06-23 ENCOUNTER — Other Ambulatory Visit: Payer: Self-pay

## 2021-06-23 VITALS — Wt 202.1 lb

## 2021-06-23 DIAGNOSIS — E669 Obesity, unspecified: Secondary | ICD-10-CM | POA: Diagnosis not present

## 2021-06-23 DIAGNOSIS — Z7689 Persons encountering health services in other specified circumstances: Secondary | ICD-10-CM

## 2021-06-23 DIAGNOSIS — Z6835 Body mass index (BMI) 35.0-35.9, adult: Secondary | ICD-10-CM | POA: Diagnosis not present

## 2021-06-23 MED ORDER — OZEMPIC (0.25 OR 0.5 MG/DOSE) 2 MG/1.5ML ~~LOC~~ SOPN: 0.5000 mg | PEN_INJECTOR | SUBCUTANEOUS | 0 refills | Status: DC

## 2021-06-23 MED ORDER — PEN NEEDLES 31G X 8 MM MISC: 1 refills | Status: DC

## 2021-06-23 NOTE — Progress Notes (Signed)
Pt presents for telemedicine visit for weight loss, pt currently on Phentermine 15 mg capsules

## 2021-06-28 ENCOUNTER — Encounter: Payer: Self-pay | Admitting: Family

## 2021-06-30 ENCOUNTER — Other Ambulatory Visit: Payer: Self-pay | Admitting: Family

## 2021-06-30 DIAGNOSIS — E669 Obesity, unspecified: Secondary | ICD-10-CM

## 2021-06-30 DIAGNOSIS — Z6835 Body mass index (BMI) 35.0-35.9, adult: Secondary | ICD-10-CM

## 2021-06-30 DIAGNOSIS — Z7689 Persons encountering health services in other specified circumstances: Secondary | ICD-10-CM

## 2021-06-30 MED ORDER — WEGOVY 0.25 MG/0.5ML ~~LOC~~ SOAJ: 0.2500 mg | SUBCUTANEOUS | 0 refills | Status: DC

## 2021-06-30 MED ORDER — PEN NEEDLES 31G X 8 MM MISC: 6 refills | Status: DC

## 2021-07-01 ENCOUNTER — Ambulatory Visit: Payer: Medicaid Other | Admitting: Family

## 2021-07-07 ENCOUNTER — Encounter: Payer: Self-pay | Admitting: Family

## 2021-07-07 ENCOUNTER — Other Ambulatory Visit: Payer: Self-pay

## 2021-07-07 DIAGNOSIS — Z6835 Body mass index (BMI) 35.0-35.9, adult: Secondary | ICD-10-CM

## 2021-07-07 DIAGNOSIS — Z7689 Persons encountering health services in other specified circumstances: Secondary | ICD-10-CM

## 2021-07-07 DIAGNOSIS — E669 Obesity, unspecified: Secondary | ICD-10-CM

## 2021-07-07 MED ORDER — OZEMPIC (0.25 OR 0.5 MG/DOSE) 2 MG/1.5ML ~~LOC~~ SOPN: 0.5000 mg | PEN_INJECTOR | SUBCUTANEOUS | 0 refills | Status: DC

## 2021-07-07 NOTE — Telephone Encounter (Signed)
[name] called this morning regarding her message she sent to Jillian Bishop.  [name] stated her insurance has updated and will cover the Ozempic prescription for a lower cost. [name] requests the prescription for the Ozempic be sent to her pharmacy if possible.

## 2021-07-07 NOTE — Progress Notes (Signed)
At pt request Ozempic sent into pharmacy

## 2021-07-09 ENCOUNTER — Ambulatory Visit: Payer: Medicaid Other

## 2021-07-28 NOTE — Progress Notes (Signed)
Patient ID: Jillian Bishop, female    DOB: Aug 10, 1996  MRN: [id number]  CC: Weight Management Follow-Up   Subjective: Jillian Bishop is a 25 y.o. female who presents for weight management follow-up.   Her concerns today include:   WEIGHT MANAGEMENT FOLLOW-UP: Doing well on Ozempic. However, having side effect of nausea depending on injection site. When injecting into the thigh causing nausea and vomiting. When injecting into the stomach causing less side effects. Prefers injection in thigh because feels more effective at that site. Weekly injections on Sundays. She is watching what she eats.     Current Outpatient Medications on File Prior to Visit  Medication Sig Dispense Refill   etonogestrel (NEXPLANON) 68 MG IMPL implant 1 each by Subdermal route once. Left arm Apr 23, 2020     ibuprofen (ADVIL) 600 MG tablet ibuprofen 600 mg tablet  TAKE 1 TABLET BY MOUTH EVERY 6 HOURS AS NEEDED FOR MODERATE PAIN OR CRAMPING     Insulin Pen Needle (PEN NEEDLES) 31G X 8 MM MISC UAD 100 each 6   Multiple Vitamins-Minerals (MULTIVITAMIN) tablet Take 1 tablet by mouth daily. 90 tablet 0   phentermine 15 MG capsule Take 1 capsule (15 mg total) by mouth every morning. 30 capsule 0   No current facility-administered medications on file prior to visit.    No Known Allergies  Social History   Socioeconomic History   Marital status: Single    Spouse name: Not on file   Number of children: Not on file   Years of education: Not on file   Highest education level: Not on file  Occupational History   Not on file  Tobacco Use   Smoking status: Never   Smokeless tobacco: Never  Vaping Use   Vaping Use: Never used  Substance and Sexual Activity   Alcohol use: No   Drug use: No   Sexual activity: Not on file  Other Topics Concern   Not on file  Social History Narrative   Not on file   Social Determinants of Health   Financial Resource Strain: Not on file  Food Insecurity: Not on file   Transportation Needs: Not on file  Physical Activity: Not on file  Stress: Not on file  Social Connections: Not on file  Intimate Partner Violence: Not on file    Family History  Problem Relation Age of Onset   Hypertension Mother    Diabetes Mother    Hypertension Father    Diabetes Father    Hypertension Sister    Heart disease Paternal Grandmother    Hypertension Paternal Grandmother    Diabetes Maternal Grandmother    Hypertension Maternal Grandmother    Diabetes Maternal Aunt    Diabetes Sister     Past Surgical History:  Procedure Laterality Date   APPENDECTOMY  age 38 or [place] N/A 11/19/2018   Procedure: DILATATION AND EVACUATION;  Surgeon: Paula Compton, MD;  Location: [facility];  Service: Gynecology;  Laterality: N/A;   LAPAROSCOPIC OVARIAN CYSTECTOMY Right 09/05/2020   Procedure: LAPAROSCOPIC OVARIAN CYSTECTOMY LYSIS OF ADHESISONS;  Surgeon: Sherlyn Hay, DO;  Location: [facility];  Service: Gynecology;  Laterality: Right;    ROS: Review of Systems Negative except as stated above  PHYSICAL EXAM: BP 100/68 (BP Location: Left Arm, Patient Position: Sitting, Cuff Size: Large)   Pulse 79   Temp 98.4 F (36.9 C)   Resp 18   Ht 5' 4.17" (1.63  m)   Wt 194 lb (88 kg)   SpO2 97%   BMI 33.12 kg/m   Wt Readings from Last 3 Encounters:  07/29/21 194 lb (88 kg)  06/23/21 202 lb 1.6 oz (91.7 kg)  06/11/21 206 lb 3.2 oz (93.5 kg)    Physical Exam HENT:     Head: Normocephalic and atraumatic.  Eyes:     Extraocular Movements: Extraocular movements intact.     Conjunctiva/sclera: Conjunctivae normal.     Pupils: Pupils are equal, round, and reactive to light.  Cardiovascular:     Rate and Rhythm: Normal rate and regular rhythm.     Pulses: Normal pulses.     Heart sounds: Normal heart sounds.  Pulmonary:     Effort: Pulmonary effort is normal.     Breath sounds: Normal breath sounds.  Musculoskeletal:      Cervical back: Normal range of motion and neck supple.  Neurological:     General: No focal deficit present.     Mental Status: She is alert and oriented to person, place, and time.  Psychiatric:        Mood and Affect: Mood normal.        Behavior: Behavior normal.     ASSESSMENT AND PLAN: 1. Encounter for weight management: 2. Class 2 obesity without serious comorbidity with body mass index (BMI) of 35.0 to 35.9 in adult, unspecified obesity type: - Patient doing well with weight loss. She has lost a total of 12 pounds since July 2022. Encouraged to keep up the great work! - Increase Semaglutide from 0.5 mg weekly to 1 mg weekly.  - Begin Ondansetron for side effect of nausea usually related to injection of thigh. - Counseled on low-sodium DASH diet and 150 minutes of moderate intensity exercise per week as tolerated. Discussed medication compliance, adverse effects. - Follow-up with primary provider in 4 weeks or sooner if needed.  - Semaglutide,0.25 or 0.'5MG'$ /DOS, (OZEMPIC, 0.25 OR 0.5 MG/DOSE,) 2 MG/1.5ML SOPN; Inject 1 mg into the skin once a week.  Dispense: 4.5 mL; Refill: 0 - ondansetron (ZOFRAN) 4 MG tablet; Take 1 tablet (4 mg total) by mouth every 8 (eight) hours as needed for nausea or vomiting.  Dispense: 20 tablet; Refill: 0   Patient was given the opportunity to ask questions.  Patient verbalized understanding of the plan and was able to repeat key elements of the plan. Patient was given clear instructions to go to Emergency Department or return to medical center if symptoms don't improve, worsen, or new problems develop.The patient verbalized understanding.    Requested Prescriptions   Signed Prescriptions Disp Refills   Semaglutide,0.25 or 0.'5MG'$ /DOS, (OZEMPIC, 0.25 OR 0.5 MG/DOSE,) 2 MG/1.5ML SOPN 4.5 mL 0    Sig: Inject 1 mg into the skin once a week.   ondansetron (ZOFRAN) 4 MG tablet 20 tablet 0    Sig: Take 1 tablet (4 mg total) by mouth every 8 (eight) hours as  needed for nausea or vomiting.    Return in about 4 weeks (around 08/26/2021) for Follow-Up or next available weight management .  Jillian Herter, NP

## 2021-07-29 ENCOUNTER — Encounter: Payer: Self-pay | Admitting: Family

## 2021-07-29 ENCOUNTER — Other Ambulatory Visit: Payer: Self-pay

## 2021-07-29 ENCOUNTER — Ambulatory Visit (INDEPENDENT_AMBULATORY_CARE_PROVIDER_SITE_OTHER): Payer: Medicaid Other | Admitting: Family

## 2021-07-29 ENCOUNTER — Ambulatory Visit: Payer: Medicaid Other | Admitting: Family

## 2021-07-29 VITALS — BP 100/68 | HR 79 | Temp 98.4°F | Resp 18 | Ht 64.17 in | Wt 194.0 lb

## 2021-07-29 DIAGNOSIS — Z7689 Persons encountering health services in other specified circumstances: Secondary | ICD-10-CM

## 2021-07-29 DIAGNOSIS — E669 Obesity, unspecified: Secondary | ICD-10-CM

## 2021-07-29 DIAGNOSIS — Z6835 Body mass index (BMI) 35.0-35.9, adult: Secondary | ICD-10-CM | POA: Diagnosis not present

## 2021-07-29 MED ORDER — OZEMPIC (0.25 OR 0.5 MG/DOSE) 2 MG/1.5ML ~~LOC~~ SOPN: 1.0000 mg | PEN_INJECTOR | SUBCUTANEOUS | 0 refills | Status: DC

## 2021-07-29 MED ORDER — ONDANSETRON HCL 4 MG PO TABS: 4.0000 mg | ORAL_TABLET | Freq: Three times a day (TID) | ORAL | 0 refills | Status: DC | PRN

## 2021-07-29 NOTE — Progress Notes (Signed)
Pt presents for weight management

## 2021-07-30 ENCOUNTER — Encounter: Payer: Self-pay | Admitting: Family

## 2021-07-30 ENCOUNTER — Telehealth: Payer: Self-pay | Admitting: Family

## 2021-07-30 ENCOUNTER — Other Ambulatory Visit: Payer: Self-pay | Admitting: Family

## 2021-07-30 DIAGNOSIS — E669 Obesity, unspecified: Secondary | ICD-10-CM

## 2021-07-30 DIAGNOSIS — Z6835 Body mass index (BMI) 35.0-35.9, adult: Secondary | ICD-10-CM

## 2021-07-30 DIAGNOSIS — Z7689 Persons encountering health services in other specified circumstances: Secondary | ICD-10-CM

## 2021-07-30 MED ORDER — SEMAGLUTIDE (1 MG/DOSE) 4 MG/3ML ~~LOC~~ SOPN: 1.0000 mg | PEN_INJECTOR | SUBCUTANEOUS | 0 refills | Status: DC

## 2021-07-30 NOTE — Telephone Encounter (Signed)
Complete

## 2021-07-30 NOTE — Telephone Encounter (Signed)
[facility] called about wrong injection dose needs to be corrected so insurance can correct it I spoke with a robert from [employer]

## 2021-09-10 ENCOUNTER — Encounter: Payer: Self-pay | Admitting: Family

## 2021-09-11 ENCOUNTER — Other Ambulatory Visit: Payer: Self-pay | Admitting: Family

## 2021-09-11 DIAGNOSIS — Z7689 Persons encountering health services in other specified circumstances: Secondary | ICD-10-CM

## 2021-09-11 DIAGNOSIS — E669 Obesity, unspecified: Secondary | ICD-10-CM

## 2021-09-11 MED ORDER — PEN NEEDLES 31G X 8 MM MISC: 0 refills | Status: DC

## 2021-09-11 MED ORDER — SEMAGLUTIDE(0.25 OR 0.5MG/DOS) 2 MG/1.5ML ~~LOC~~ SOPN: 0.2500 mg | PEN_INJECTOR | SUBCUTANEOUS | 1 refills | Status: DC

## 2021-09-26 ENCOUNTER — Encounter: Payer: Self-pay | Admitting: Family

## 2021-10-03 ENCOUNTER — Encounter: Payer: Self-pay | Admitting: Family

## 2021-10-03 NOTE — Progress Notes (Signed)
Patient ID: Jillian Bishop, female    DOB: [birth year]  MRN: [id number]  CC: Weight Management Follow-Up  Subjective: Jillian Bishop is a 25 y.o. female who presents for weight management follow-up.   Her concerns today include:  WEIGHT MANAGEMENT FOLLOW-UP: 07/29/2021: - Patient doing well with weight loss. She has lost a total of 12 pounds since July 2022. Encouraged to keep up the great work! - Increase Semaglutide from 0.5 mg weekly to 1 mg weekly.  - Begin Ondansetron for side effect of nausea usually related to injection of thigh.  10/07/2021: Doing well on current regimen. Has new health insurance and [facility] listed as prior authorization needed. Goal weight 165 pounds. Home scale reads about 5 pounds more than in office. Not currently breastfeeding daughter who is currently on whole milk and PediaSure.  2. PREDIABETES FOLLOW-UP: Repeat screening today.  There are no problems to display for this patient.    Current Outpatient Medications on File Prior to Visit  Medication Sig Dispense Refill   etonogestrel (NEXPLANON) 68 MG IMPL implant 1 each by Subdermal route once. Left arm Apr 23, 2020     Insulin Pen Needle (PEN NEEDLES) 31G X 8 MM MISC UAD 50 each 0   ondansetron (ZOFRAN) 4 MG tablet Take 1 tablet (4 mg total) by mouth every 8 (eight) hours as needed for nausea or vomiting. 20 tablet 0   ibuprofen (ADVIL) 600 MG tablet ibuprofen 600 mg tablet  TAKE 1 TABLET BY MOUTH EVERY 6 HOURS AS NEEDED FOR MODERATE PAIN OR CRAMPING (Patient not taking: Reported on 10/07/2021)     phentermine 15 MG capsule Take 1 capsule (15 mg total) by mouth every morning. 30 capsule 0   No current facility-administered medications on file prior to visit.    No Known Allergies  Social History   Socioeconomic History   Marital status: Single    Spouse name: Not on file   Number of children: Not on file   Years of education: Not on file   Highest education level: Not on file  Occupational  History   Not on file  Tobacco Use   Smoking status: Never   Smokeless tobacco: Never  Vaping Use   Vaping Use: Never used  Substance and Sexual Activity   Alcohol use: No   Drug use: No   Sexual activity: Not on file  Other Topics Concern   Not on file  Social History Narrative   Not on file   Social Determinants of Health   Financial Resource Strain: Not on file  Food Insecurity: Not on file  Transportation Needs: Not on file  Physical Activity: Not on file  Stress: Not on file  Social Connections: Not on file  Intimate Partner Violence: Not on file    Family History  Problem Relation Age of Onset   Hypertension Mother    Diabetes Mother    Hypertension Father    Diabetes Father    Hypertension Sister    Heart disease Paternal Grandmother    Hypertension Paternal Grandmother    Diabetes Maternal Grandmother    Hypertension Maternal Grandmother    Diabetes Maternal Aunt    Diabetes Sister     Past Surgical History:  Procedure Laterality Date   APPENDECTOMY  age 85 or [place] N/A 11/19/2018   Procedure: DILATATION AND EVACUATION;  Surgeon: Paula Compton, MD;  Location: [facility];  Service: Gynecology;  Laterality: N/A;   LAPAROSCOPIC OVARIAN CYSTECTOMY Right  09/05/2020   Procedure: LAPAROSCOPIC OVARIAN CYSTECTOMY LYSIS OF ADHESISONS;  Surgeon: Sherlyn Hay, DO;  Location: [facility];  Service: Gynecology;  Laterality: Right;    ROS: Review of Systems Negative except as stated above  PHYSICAL EXAM: BP 113/76 (BP Location: Left Arm, Patient Position: Sitting, Cuff Size: Large)   Pulse 76   Resp 16   Ht 5\' 4"  (1.626 m)   Wt 188 lb (85.3 kg)   SpO2 97%   Breastfeeding Yes   BMI 32.27 kg/m   Wt Readings from Last 3 Encounters:  10/07/21 188 lb (85.3 kg)  07/29/21 194 lb (88 kg)  06/23/21 202 lb 1.6 oz (91.7 kg)    Physical Exam HENT:     Head: Normocephalic and atraumatic.  Eyes:     Extraocular  Movements: Extraocular movements intact.     Conjunctiva/sclera: Conjunctivae normal.     Pupils: Pupils are equal, round, and reactive to light.  Cardiovascular:     Rate and Rhythm: Normal rate and regular rhythm.     Pulses: Normal pulses.     Heart sounds: Normal heart sounds.  Pulmonary:     Effort: Pulmonary effort is normal.     Breath sounds: Normal breath sounds.  Musculoskeletal:     Cervical back: Normal range of motion and neck supple.  Neurological:     General: No focal deficit present.     Mental Status: She is alert and oriented to person, place, and time.  Psychiatric:        Mood and Affect: Mood normal.        Behavior: Behavior normal.   Results for orders placed or performed in visit on 10/07/21  POCT glycosylated hemoglobin (Hb A1C)  Result Value Ref Range   Hemoglobin A1C 5.4 4.0 - 5.6 %   HbA1c POC (<> result, manual entry)     HbA1c, POC (prediabetic range)     HbA1c, POC (controlled diabetic range)      ASSESSMENT AND PLAN: 1. Encounter for weight management: - Semaglutide, Ozempic, discontinued.  - Begin Semaglutide-Weight Management, Wegovy, as prescribed. Counseled if prescribed medication not available at pharmacy due to shortage to notify me so that alternate can be sent, patient agreeable.  - Will ask CMA to assist with prior authorization of Wegovy. - Patient reports no longer breastfeeding daughter who is currently using whole milk and PediaSure.  - Follow-up with primary provider in 4 weeks or sooner if needed.   - Semaglutide-Weight Management 1.7 MG/0.75ML SOAJ; Inject 1.7 mg into the skin once a week.  Dispense: 3 mL; Refill: 0  2. Prediabetes: - Hemoglobin A1c today 5.4%. This is improved from previous 5.9% on 05/06/2021. - Discussed the importance of healthy eating habits, low-carbohydrate diet, low-sugar diet, and regular aerobic exercise (at least 150 minutes a week as tolerated)  to achieve or maintain control of prediabetes. -  Follow-up with primary provider in 6 months or sooner if needed.  - POCT glycosylated hemoglobin (Hb A1C)    Patient was given the opportunity to ask questions.  Patient verbalized understanding of the plan and was able to repeat key elements of the plan. Patient was given clear instructions to go to Emergency Department or return to medical center if symptoms don't improve, worsen, or new problems develop.The patient verbalized understanding.   Orders Placed This Encounter  Procedures   POCT glycosylated hemoglobin (Hb A1C)     Requested Prescriptions   Signed Prescriptions Disp Refills   Semaglutide-Weight  Management 1.7 MG/0.75ML SOAJ 3 mL 0    Sig: Inject 1.7 mg into the skin once a week.    Return in 4 weeks (on 11/04/2021) for Follow-Up or next available weight management and 6 months prediabetes.  Camillia Herter, NP

## 2021-10-07 ENCOUNTER — Ambulatory Visit: Payer: BC Managed Care – PPO | Admitting: Family

## 2021-10-07 ENCOUNTER — Other Ambulatory Visit: Payer: Self-pay

## 2021-10-07 ENCOUNTER — Encounter: Payer: Self-pay | Admitting: Family

## 2021-10-07 VITALS — BP 113/76 | HR 76 | Resp 16 | Ht 64.0 in | Wt 188.0 lb

## 2021-10-07 DIAGNOSIS — Z7689 Persons encountering health services in other specified circumstances: Secondary | ICD-10-CM

## 2021-10-07 DIAGNOSIS — R7303 Prediabetes: Secondary | ICD-10-CM

## 2021-10-07 LAB — POCT GLYCOSYLATED HEMOGLOBIN (HGB A1C): Hemoglobin A1C: 5.4 % (ref 4.0–5.6)

## 2021-10-07 MED ORDER — SEMAGLUTIDE-WEIGHT MANAGEMENT 1.7 MG/0.75ML ~~LOC~~ SOAJ: 1.7000 mg | SUBCUTANEOUS | 0 refills | Status: DC

## 2021-10-07 NOTE — Patient Instructions (Signed)
Semaglutide Injection (Weight Management) What is this medication? SEMAGLUTIDE (SEM a GLOO tide) promotes weight loss. It may also be used to maintain weight loss. It works by decreasing appetite. Changes to diet and exercise are often combined with this medication. This medicine may be used for other purposes; ask your health care provider or pharmacist if you have questions. COMMON BRAND NAME(S): FWYOVZ What should I tell my care team before I take this medication? They need to know if you have any of these conditions: Endocrine tumors (MEN 2) or if someone in your family had these tumors Eye disease, vision problems Gallbladder disease History of depression or mental health disease History of pancreatitis Kidney disease Stomach or intestine problems Suicidal thoughts, plans, or attempt; a previous suicide attempt by you or a family member Thyroid cancer or if someone in your family had thyroid cancer An unusual or allergic reaction to semaglutide, other medications, foods, dyes, or preservatives Pregnant or trying to get pregnant Breast-feeding How should I use this medication? This medication is injected under the skin. You will be taught how to prepare and give it. Take it as directed on the prescription label. It is given once every week (every 7 days). Keep taking it unless your care team tells you to stop. It is important that you put your used needles and pens in a special sharps container. Do not put them in a trash can. If you do not have a sharps container, call your pharmacist or care team to get one. A special MedGuide will be given to you by the pharmacist with each prescription and refill. Be sure to read this information carefully each time. This medication comes with INSTRUCTIONS FOR USE. Ask your pharmacist for directions on how to use this medication. Read the information carefully. Talk to your pharmacist or care team if you have questions. Talk to your care team about  the use of this medication in children. Special care may be needed. Overdosage: If you think you have taken too much of this medicine contact a poison control center or emergency room at once. NOTE: This medicine is only for you. Do not share this medicine with others. What if I miss a dose? If you miss a dose and the next scheduled dose is more than 2 days away, take the missed dose as soon as possible. If you miss a dose and the next scheduled dose is less than 2 days away, do not take the missed dose. Take the next dose at your regular time. Do not take double or extra doses. If you miss your dose for 2 weeks or more, take the next dose at your regular time or call your care team to talk about how to restart this medication. What may interact with this medication? Insulin and other medications for diabetes This list may not describe all possible interactions. Give your health care provider a list of all the medicines, herbs, non-prescription drugs, or dietary supplements you use. Also tell them if you smoke, drink alcohol, or use illegal drugs. Some items may interact with your medicine. What should I watch for while using this medication? Visit your care team for regular checks on your progress. It may be some time before you see the benefit from this medication. Drink plenty of fluids while taking this medication. Check with your care team if you have severe diarrhea, nausea, and vomiting, or if you sweat a lot. The loss of too much body fluid may make it dangerous for  you to take this medication. This medication may affect blood sugar levels. Ask your care team if changes in diet or medications are needed if you have diabetes. If you or your family notice any changes in your behavior, such as new or worsening depression, thoughts of harming yourself, anxiety, other unusual or disturbing thoughts, or memory loss, call your care team right away. Women should inform their care team if they wish to  become pregnant or think they might be pregnant. Losing weight while pregnant is not advised and may cause harm to the unborn child. Talk to your care team for more information. What side effects may I notice from receiving this medication? Side effects that you should report to your care team as soon as possible: Allergic reactions-skin rash, itching, hives, swelling of the face, lips, tongue, or throat Change in vision Dehydration-increased thirst, dry mouth, feeling faint or lightheaded, headache, dark yellow or brown urine Gallbladder problems-severe stomach pain, nausea, vomiting, fever Heart palpitations-rapid, pounding, or irregular heartbeat Kidney injury-decrease in the amount of urine, swelling of the ankles, hands, or feet Pancreatitis-severe stomach pain that spreads to your back or gets worse after eating or when touched, fever, nausea, vomiting Thoughts of suicide or self-harm, worsening mood, feelings of depression Thyroid cancer-new mass or lump in the neck, pain or trouble swallowing, trouble breathing, hoarseness Side effects that usually do not require medical attention (report to your care team if they continue or are bothersome): Diarrhea Loss of appetite Nausea Stomach pain Vomiting This list may not describe all possible side effects. Call your doctor for medical advice about side effects. You may report side effects to FDA at 1-800-FDA-1088. Where should I keep my medication? Keep out of the reach of children and pets. Refrigeration (preferred): Store in the refrigerator. Do not freeze. Keep this medication in the original container until you are ready to take it. Get rid of any unused medication after the expiration date. Room temperature: If needed, prior to cap removal, the pen can be stored at room temperature for up to 28 days. Protect from light. If it is stored at room temperature, get rid of any unused medication after 28 days or after it expires, whichever is  first. It is important to get rid of the medication as soon as you no longer need it or it is expired. You can do this in two ways: Take the medication to a medication take-back program. Check with your pharmacy or law enforcement to find a location. If you cannot return the medication, follow the directions in the [place]. NOTE: This sheet is a summary. It may not cover all possible information. If you have questions about this medicine, talk to your doctor, pharmacist, or health care provider.  2022 Elsevier/Gold Standard (2021-02-28 08:20:32)

## 2021-10-09 ENCOUNTER — Telehealth: Payer: Self-pay

## 2021-10-09 NOTE — Telephone Encounter (Signed)
PA started for [facility]#[id number]

## 2021-10-20 ENCOUNTER — Encounter: Payer: Self-pay | Admitting: Family

## 2021-10-20 ENCOUNTER — Other Ambulatory Visit: Payer: Self-pay | Admitting: Family

## 2021-10-20 DIAGNOSIS — Z809 Family history of malignant neoplasm, unspecified: Secondary | ICD-10-CM

## 2021-10-20 DIAGNOSIS — Z8249 Family history of ischemic heart disease and other diseases of the circulatory system: Secondary | ICD-10-CM

## 2021-10-22 ENCOUNTER — Telehealth: Payer: Self-pay | Admitting: Genetic Counselor

## 2021-10-22 NOTE — Telephone Encounter (Signed)
Scheduled appt per 11/14 referral. Pt is aware of appt date and time.  

## 2021-10-27 ENCOUNTER — Other Ambulatory Visit: Payer: Self-pay | Admitting: Family

## 2021-10-27 ENCOUNTER — Encounter: Payer: Self-pay | Admitting: Family

## 2021-10-27 DIAGNOSIS — Z7689 Persons encountering health services in other specified circumstances: Secondary | ICD-10-CM

## 2021-10-31 MED ORDER — SEMAGLUTIDE-WEIGHT MANAGEMENT 1.7 MG/0.75ML ~~LOC~~ SOAJ: 1.7000 mg | SUBCUTANEOUS | 0 refills | Status: AC

## 2021-10-31 NOTE — Telephone Encounter (Signed)
Semaglutide-Weight Management refilled per patient request. Please keep scheduled appointment for additional refill.

## 2021-11-03 NOTE — Progress Notes (Deleted)
REFERRING PROVIDER: Camillia Herter, NP [address] [place],  [place] [address]  PRIMARY PROVIDER:  Camillia Herter, NP  PRIMARY REASON FOR VISIT:  No diagnosis found.   HISTORY OF PRESENT ILLNESS:   Jillian Bishop, a 25 y.o. female, was seen for a [place] cancer genetics consultation at the request of Dr. Minette Brine due to a {Personal/family:20331} history of {cancer/polyps}.  Jillian Bishop presents to clinic today to discuss the possibility of a hereditary predisposition to cancer, to discuss genetic testing, and to further clarify her future cancer risks, as well as potential cancer risks for family members.   In ***, at the age of ***, Jillian Bishop was diagnosed with {CA PATHOLOGY:63853} of the {right left (wildcard):15202} {CA GHWEX:93716}. The treatment plan ***.    *** Jillian Bishop is a 25 y.o. female with no personal history of cancer.    CANCER HISTORY:  Oncology History   No history exists.     RISK FACTORS:  Menarche was at age ***.  First live birth at age ***.  OCP use for approximately {Numbers 1-12 multi-select:20307} years.  Ovaries intact: {Yes/No-Ex:120004}.  Uterus intact: {Yes/No-Ex:120004}.  Menopausal status: {Menopause:31378}.  HRT use: {Numbers 1-12 multi-select:20307} years. Colonoscopy: {Yes/No-Ex:120004}; {normal/abnormal/not examined:14677}. Mammogram within the last year: {Yes/No-Ex:120004}. Number of breast biopsies: {Numbers 1-12 multi-select:20307}. Up to date with pelvic exams: {Yes/No-Ex:120004}. Any excessive radiation exposure in the past: {Yes/No-Ex:120004}  Past Medical History:  Diagnosis Date   Asthma    mild well controlled, no inhaler use since 2016   Benign teratoma of ovary, right    COVID-19 11/02/2019   sore throat cough, sob loss of taste and smell, cough until 03-11-2020 all symptoms resolved now   Dermoid cyst    GERD (gastroesophageal reflux disease)    Headache    tension, occ migraine   HPV (human papilloma virus)  infection     Past Surgical History:  Procedure Laterality Date   APPENDECTOMY  age 44 or [place] N/A 11/19/2018   Procedure: DILATATION AND EVACUATION;  Surgeon: Paula Compton, MD;  Location: [facility];  Service: Gynecology;  Laterality: N/A;   LAPAROSCOPIC OVARIAN CYSTECTOMY Right 09/05/2020   Procedure: LAPAROSCOPIC OVARIAN CYSTECTOMY LYSIS OF ADHESISONS;  Surgeon: Sherlyn Hay, DO;  Location: [facility];  Service: Gynecology;  Laterality: Right;    Social History   Socioeconomic History   Marital status: Single    Spouse name: Not on file   Number of children: Not on file   Years of education: Not on file   Highest education level: Not on file  Occupational History   Not on file  Tobacco Use   Smoking status: Never   Smokeless tobacco: Never  Vaping Use   Vaping Use: Never used  Substance and Sexual Activity   Alcohol use: No   Drug use: No   Sexual activity: Not on file  Other Topics Concern   Not on file  Social History Narrative   Not on file   Social Determinants of Health   Financial Resource Strain: Not on file  Food Insecurity: Not on file  Transportation Needs: Not on file  Physical Activity: Not on file  Stress: Not on file  Social Connections: Not on file     FAMILY HISTORY:  We obtained a detailed, 4-generation family history.  Significant diagnoses are listed below: Family History  Problem Relation Age of Onset   Hypertension Mother    Diabetes Mother  Hypertension Father    Diabetes Father    Hypertension Sister    Heart disease Paternal Grandmother    Hypertension Paternal Grandmother    Diabetes Maternal Grandmother    Hypertension Maternal Grandmother    Diabetes Maternal Aunt    Diabetes Sister     Jillian Bishop is {aware/unaware} of previous family history of genetic testing for hereditary cancer risks. Patient's maternal ancestors are of *** descent, and paternal ancestors are of ***  descent. There {IS NO:12509} reported Ashkenazi Jewish ancestry. There {IS NO:12509} known consanguinity.  GENETIC COUNSELING ASSESSMENT: Jillian Bishop is a 25 y.o. female with a {Personal/family:20331} history of {cancer/polyps} which is somewhat suggestive of a {DISEASE} and predisposition to cancer given ***. We, therefore, discussed and recommended the following at today's visit.   DISCUSSION: We discussed that 5 - 10% of cancer is hereditary, with most cases of hereditary *** cancer associated with ***.  There are other genes that can be associated with hereditary *** cancer syndromes.  We discussed that testing is beneficial for several reasons, including knowing about other cancer risks, identifying potential screening and risk-reduction options that may be appropriate, and to understanding if other family members could be at risk for cancer and allowing them to undergo genetic testing.  We reviewed the characteristics, features and inheritance patterns of hereditary cancer syndromes. We also discussed genetic testing, including the appropriate family members to test, the process of testing, insurance coverage and turn-around-time for results. We discussed the implications of a negative, positive, carrier and/or variant of uncertain significant result. We discussed that negative results would be uninformative given that Jillian Bishop does not have a personal history of cancer. We recommended Jillian Bishop pursue genetic testing for a panel that contains genes associated with ***.  Jillian Bishop was offered a common hereditary cancer panel (47 genes) and an expanded pan-cancer panel (84 genes). Jillian Bishop was informed of the benefits and limitations of each panel, including that expanded pan-cancer panels contain several genes that do not have clear management guidelines at this point in time.  We also discussed that as the number of genes included on a panel increases, the chances of variants of uncertain significance  increases.  After considering the benefits and limitations of each gene panel, Jillian Bishop elected to have an *** through ***.   Based on Jillian Bishop's {Personal/family:20331} history of cancer, she meets medical criteria for genetic testing. Despite that she meets criteria, she may still have an out of pocket cost. We discussed that if her out of pocket cost for testing is over $100, the laboratory will call and confirm whether she wants to proceed with testing.  If the out of pocket cost of testing is less than $100 she will be billed by the genetic testing laboratory.   ***We reviewed the characteristics, features and inheritance patterns of hereditary cancer syndromes. We also discussed genetic testing, including the appropriate family members to test, the process of testing, insurance coverage and turn-around-time for results. We discussed the implications of a negative, positive and/or variant of uncertain significant result. In order to get genetic test results in a timely manner so that Jillian Bishop can use these genetic test results for surgical decisions, we recommended Jillian Bishop pursue genetic testing for the ***. Once complete, we recommend Jillian Bishop pursue reflex genetic testing to the *** gene panel.   Based on Jillian Bishop's {Personal/family:20331} history of cancer, she meets medical criteria for genetic testing. Despite that she meets criteria, she  may still have an out of pocket cost.   ***We discussed with Jillian Bishop that the {Personal/family:20331} history does not meet insurance or NCCN criteria for genetic testing and, therefore, is not highly consistent with a familial hereditary cancer syndrome.  We feel she is at low risk to harbor a gene mutation associated with such a condition. Thus, we did not recommend any genetic testing, at this time, and recommended Jillian Bishop continue to follow the cancer screening guidelines given by her primary healthcare provider.  We discussed that some people do  not want to undergo genetic testing due to fear of genetic discrimination.  A federal law called the Genetic Information Non-Discrimination Act (GINA) of 2008 helps protect individuals against genetic discrimination based on their genetic test results.  It impacts both health insurance and employment.  With health insurance, it protects against increased premiums, being kicked off insurance or being forced to take a test in order to be insured.  For employment it protects against hiring, firing and promoting decisions based on genetic test results.  GINA does not apply to those in the [employer], those who work for companies with less than 15 employees, and new life insurance or long-term disability insurance policies.  Health status due to a cancer diagnosis is not protected under GINA.  PLAN: After considering the risks, benefits, and limitations, Ms. Gebhardt provided informed consent to pursue genetic testing and the blood sample was sent to {Lab} Laboratories for analysis of the {test}. Results should be available within approximately {TAT TIME} weeks' time, at which point they will be disclosed by telephone to Ms. Delio, as will any additional recommendations warranted by these results. Ms. Tomczak will receive a summary of her genetic counseling visit and a copy of her results once available. This information will also be available in Epic.   *** Despite our recommendation, Ms. Henthorn did not wish to pursue genetic testing at today's visit. We understand this decision and remain available to coordinate genetic testing at any time in the future. We, therefore, recommend Ms. Hogate continue to follow the cancer screening guidelines given by her primary healthcare provider.  ***Based on Ms. Perfetti's family history, we recommended her ***, who was diagnosed with *** at age ***, have genetic counseling and testing. Ms. Herskowitz will let us know if we can be of any assistance in coordinating genetic counseling and/or  testing for this family member.   Lastly, we encouraged Ms. Yero to remain in contact with cancer genetics annually so that we can continuously update the family history and inform her of any changes in cancer genetics and testing that may be of benefit for this family.   Ms. Klebba questions were answered to her satisfaction today. Our contact information was provided should additional questions or concerns arise. Thank you for the referral and allowing [place] to share in the care of your patient.   Lucille Passy, MS, [facility] Genetic Counselor [email or web address] (P) [phone]  The patient was seen for a total of *** minutes in face-to-face genetic counseling.  ***The patient brought ***.  ***The patient was seen alone.  Drs. Magrinat, Lindi Adie and/or Burr Medico were available to discuss this case as needed.  _______________________________________________________________________ For Office Staff:  Number of people involved in session: *** Was an Intern/ student involved with case: {YES/NO:63}

## 2021-11-04 ENCOUNTER — Inpatient Hospital Stay: Payer: Medicaid Other

## 2021-11-04 ENCOUNTER — Inpatient Hospital Stay: Payer: Medicaid Other | Attending: Genetic Counselor | Admitting: Genetic Counselor

## 2021-11-07 NOTE — Progress Notes (Signed)
Patient ID: Jillian Bishop, female    DOB: [birth year]  MRN: [id number]  CC: Weight Management Follow-Up   Subjective: Jillian Bishop is a 25 y.o. female who presents for weight management follow-up.   Her concerns today include:   WEIGHT MANAGEMENT FOLLOW-UP: 10/07/2021: - Semaglutide, Ozempic, discontinued.  - Begin Semaglutide-Weight Management, Wegovy, as prescribed. Counseled if prescribed medication not available at pharmacy due to shortage to notify me so that alternate can be sent, patient agreeable.  - Patient reports no longer breastfeeding daughter who is currently using whole milk and PediaSure.  - Follow-up with primary provider in 4 weeks or sooner if needed.    11/10/2021: Doing well on current regimen. Recently picked up a new prescription and has 3 weeks left before needing refill. Having some bloating sometimes with nausea and vomiting. Denies abdominal pain. Having normal bowel movements. Reports she is no longer drinking sodas but does enjoy sweet tea.    Current Outpatient Medications on File Prior to Visit  Medication Sig Dispense Refill   etonogestrel (NEXPLANON) 68 MG IMPL implant 1 each by Subdermal route once. Left arm Apr 23, 2020     ibuprofen (ADVIL) 600 MG tablet ibuprofen 600 mg tablet  TAKE 1 TABLET BY MOUTH EVERY 6 HOURS AS NEEDED FOR MODERATE PAIN OR CRAMPING (Patient not taking: Reported on 10/07/2021)     Insulin Pen Needle (PEN NEEDLES) 31G X 8 MM MISC UAD 50 each 0   ondansetron (ZOFRAN) 4 MG tablet Take 1 tablet (4 mg total) by mouth every 8 (eight) hours as needed for nausea or vomiting. 20 tablet 0   phentermine 15 MG capsule Take 1 capsule (15 mg total) by mouth every morning. 30 capsule 0   Semaglutide-Weight Management 1.7 MG/0.75ML SOAJ Inject 1.7 mg into the skin once a week. 3 mL 0   No current facility-administered medications on file prior to visit.    No Known Allergies  Social History   Socioeconomic History   Marital status:  Single    Spouse name: Not on file   Number of children: Not on file   Years of education: Not on file   Highest education level: Not on file  Occupational History   Not on file  Tobacco Use   Smoking status: Never   Smokeless tobacco: Never  Vaping Use   Vaping Use: Never used  Substance and Sexual Activity   Alcohol use: No   Drug use: No   Sexual activity: Not on file  Other Topics Concern   Not on file  Social History Narrative   Not on file   Social Determinants of Health   Financial Resource Strain: Not on file  Food Insecurity: Not on file  Transportation Needs: Not on file  Physical Activity: Not on file  Stress: Not on file  Social Connections: Not on file  Intimate Partner Violence: Not on file    Family History  Problem Relation Age of Onset   Hypertension Mother    Diabetes Mother    Hypertension Father    Diabetes Father    Hypertension Sister    Heart disease Paternal Grandmother    Hypertension Paternal Grandmother    Diabetes Maternal Grandmother    Hypertension Maternal Grandmother    Diabetes Maternal Aunt    Diabetes Sister     Past Surgical History:  Procedure Laterality Date   APPENDECTOMY  age 1 or 89   DILATION AND EVACUATION N/A 11/19/2018   Procedure: DILATATION AND  EVACUATION;  Surgeon: Paula Compton, MD;  Location: [facility];  Service: Gynecology;  Laterality: N/A;   LAPAROSCOPIC OVARIAN CYSTECTOMY Right 09/05/2020   Procedure: LAPAROSCOPIC OVARIAN CYSTECTOMY LYSIS OF ADHESISONS;  Surgeon: Sherlyn Hay, DO;  Location: [facility];  Service: Gynecology;  Laterality: Right;    ROS: Review of Systems Negative except as stated above  PHYSICAL EXAM: BP 110/73 (BP Location: Left Arm, Patient Position: Sitting, Cuff Size: Normal)   Pulse 86   Temp 98.5 F (36.9 C)   Resp 18   Ht 5' 4.02" (1.626 m)   Wt 184 lb (83.5 kg)   SpO2 98%   BMI 31.57 kg/m   Wt Readings from Last 3 Encounters:  11/10/21 184 lb  (83.5 kg)  10/07/21 188 lb (85.3 kg)  07/29/21 194 lb (88 kg)    Physical Exam HENT:     Head: Normocephalic and atraumatic.  Eyes:     Extraocular Movements: Extraocular movements intact.     Conjunctiva/sclera: Conjunctivae normal.     Pupils: Pupils are equal, round, and reactive to light.  Cardiovascular:     Rate and Rhythm: Normal rate and regular rhythm.     Pulses: Normal pulses.     Heart sounds: Normal heart sounds.  Pulmonary:     Effort: Pulmonary effort is normal.     Breath sounds: Normal breath sounds.  Musculoskeletal:     Cervical back: Normal range of motion and neck supple.  Neurological:     General: No focal deficit present.     Mental Status: She is alert and oriented to person, place, and time.  Psychiatric:        Mood and Affect: Mood normal.        Behavior: Behavior normal.   ASSESSMENT AND PLAN: 1. Encounter for weight management: - Increase Semaglutide-Weight Management from 1.7 mg weekly to 2.4 mg weekly. She is to begin the increased dosage on 12/01/2021. - Counseled on low-sodium, DASH diet, and 150 minutes of moderate intensity exercise per week as tolerated. Discussed medication compliance, adverse effects. - Follow-up with primary provider as scheduled. - Semaglutide-Weight Management 2.4 MG/0.75ML SOAJ; Inject 2.4 mg into the skin once a week.  Dispense: 3 mL; Refill: 0  2. Bloating: - Counseled on possible causes of bloating including but not limited constipation; foods such as broccoli, cabbage, baked beans; and carbonated drinks. - May consider trying over-the-counter [place].  - Patient encouraged to monitor at home and notify me if becomes worse. - Follow-up with primary provider as scheduled.    Patient was given the opportunity to ask questions.  Patient verbalized understanding of the plan and was able to repeat key elements of the plan. Patient was given clear instructions to go to Emergency Department or return to medical center  if symptoms don't improve, worsen, or new problems develop.The patient verbalized understanding.    Requested Prescriptions   Signed Prescriptions Disp Refills   Semaglutide-Weight Management 2.4 MG/0.75ML SOAJ 3 mL 0    Sig: Inject 2.4 mg into the skin once a week.    Return in about 7 weeks (around 12/29/2021) for Follow-Up or next available weight management .  Camillia Herter, NP

## 2021-11-10 ENCOUNTER — Encounter: Payer: Self-pay | Admitting: Family

## 2021-11-10 ENCOUNTER — Telehealth: Payer: Self-pay | Admitting: Genetic Counselor

## 2021-11-10 ENCOUNTER — Other Ambulatory Visit: Payer: Self-pay

## 2021-11-10 ENCOUNTER — Ambulatory Visit (INDEPENDENT_AMBULATORY_CARE_PROVIDER_SITE_OTHER): Payer: BC Managed Care – PPO | Admitting: Family

## 2021-11-10 VITALS — BP 110/73 | HR 86 | Temp 98.5°F | Resp 18 | Ht 64.02 in | Wt 184.0 lb

## 2021-11-10 DIAGNOSIS — R140 Abdominal distension (gaseous): Secondary | ICD-10-CM | POA: Diagnosis not present

## 2021-11-10 DIAGNOSIS — Z7689 Persons encountering health services in other specified circumstances: Secondary | ICD-10-CM | POA: Diagnosis not present

## 2021-11-10 NOTE — Telephone Encounter (Signed)
Pt missed genetics appt on 11/29. I called pt to r/s appt. Spoke to pt, she is aware of new appt date and time.

## 2021-11-10 NOTE — Progress Notes (Signed)
Pt presents for weight management, pt states she is doing well on Wegovy, Pt needs to know what to do about bloating

## 2021-11-10 NOTE — Patient Instructions (Signed)
Abdominal Bloating When you have abdominal bloating, your abdomen may feel full, tight, or painful. It may also look bigger than normal or swollen (distended). Common causes of abdominal bloating include: Swallowing air. Constipation. Problems digesting food. Eating too much. Irritable bowel syndrome. This is a condition that affects the large intestine. Lactose intolerance. This is an inability to digest lactose, a natural sugar in dairy products. Celiac disease. This is a condition that affects the ability to digest gluten, a protein found in some grains. Gastroparesis. This is a condition that slows down the movement of food in the stomach and small intestine. It is more common in people with diabetes mellitus. Gastroesophageal reflux disease (GERD). This is a condition that makes stomach acid flow back into the esophagus. Urinary retention. This means that the body is holding onto urine, and the bladder cannot be emptied all the way. Follow these instructions at home: Eating and drinking Avoid eating too much. Try not to swallow air while talking or eating. Avoid eating while lying down. Avoid these foods and drinks: Foods that cause gas, such as broccoli, cabbage, cauliflower, and baked beans. Carbonated drinks. Hard candy. Chewing gum. Medicines Take over-the-counter and prescription medicines only as told by your health care provider. Take probiotic medicines. These medicines contain live bacteria or yeasts that can help digestion. Take coated peppermint oil capsules. General instructions Try to exercise regularly. Exercise may help to relieve bloating that is caused by gas and relieve constipation. Keep all follow-up visits. This is important. Contact a health care provider if: You have nausea and vomiting. You have diarrhea. You have abdominal pain. You have unusual weight loss or weight gain. You have severe pain, and medicines do not help. Get help right away if: You  have chest pain. You have trouble breathing. You have shortness of breath. You have trouble urinating. You have darker urine than normal. You have blood in your stools or have dark, tarry stools. These symptoms may represent a serious problem that is an emergency. Do not wait to see if the symptoms will go away. Get medical help right away. Call your local emergency services (911 in the [place] Do not drive yourself to the hospital. Summary Abdominal bloating means that the abdomen is swollen. Common causes of abdominal bloating are swallowing air, constipation, and problems digesting food. Avoid eating too much and avoid swallowing air. Avoid foods that cause gas, carbonated drinks, hard candy, and chewing gum. This information is not intended to replace advice given to you by your health care provider. Make sure you discuss any questions you have with your health care provider. Document Revised: 06/25/2020 Document Reviewed: 06/25/2020 Elsevier Patient Education  [place].

## 2021-11-11 MED ORDER — SEMAGLUTIDE-WEIGHT MANAGEMENT 2.4 MG/0.75ML ~~LOC~~ SOAJ: 2.4000 mg | SUBCUTANEOUS | 0 refills | Status: DC

## 2021-11-25 ENCOUNTER — Inpatient Hospital Stay: Payer: Medicaid Other | Attending: Genetic Counselor | Admitting: Genetic Counselor

## 2021-11-25 ENCOUNTER — Inpatient Hospital Stay: Payer: Medicaid Other

## 2021-12-23 ENCOUNTER — Ambulatory Visit (INDEPENDENT_AMBULATORY_CARE_PROVIDER_SITE_OTHER): Payer: BC Managed Care – PPO | Admitting: Neurology

## 2021-12-23 ENCOUNTER — Encounter: Payer: Self-pay | Admitting: Neurology

## 2021-12-23 VITALS — BP 113/67 | HR 90 | Ht 64.0 in | Wt 182.0 lb

## 2021-12-23 DIAGNOSIS — Z8249 Family history of ischemic heart disease and other diseases of the circulatory system: Secondary | ICD-10-CM | POA: Diagnosis not present

## 2021-12-23 DIAGNOSIS — R519 Headache, unspecified: Secondary | ICD-10-CM

## 2021-12-23 DIAGNOSIS — G43709 Chronic migraine without aura, not intractable, without status migrainosus: Secondary | ICD-10-CM | POA: Diagnosis not present

## 2021-12-23 MED ORDER — SUMATRIPTAN SUCCINATE 50 MG PO TABS: ORAL_TABLET | ORAL | 11 refills | Status: DC

## 2021-12-23 MED ORDER — TOPIRAMATE 100 MG PO TABS: 100.0000 mg | ORAL_TABLET | Freq: Two times a day (BID) | ORAL | 11 refills | Status: DC

## 2021-12-23 NOTE — Patient Instructions (Addendum)
Stop the frequent over-the-counter medication including Tylenol use  Meds ordered this encounter  Medications   topiramate (TOPAMAX) 100 MG tablet--daily preventive medication    Sig: Take 1 tablet (100 mg total) by mouth 2 (two) times daily.    Dispense:  60 tablet    Refill:  11   SUMAtriptan (IMITREX) 50 MG tablet    Sig: May repeat in 2 hours if headache persists or recurs.    Dispense:  12 tablet    Refill:  11     Less than 2-3 time each week, less than 2 tabs in 24 hours.  Sleep.  Orders Placed This Encounter  Procedures   MR BRAIN WO CONTRAST   MR ANGIO HEAD WO CONTRAST

## 2021-12-23 NOTE — Progress Notes (Signed)
Chief Complaint  Patient presents with   New Patient (Initial Visit)    Rm 12. Alone. PCP is Durene Fruits, NP. NP Internal referral for fam hx of aneurysm Pt c/o headaches 3-4 times per week in the occipital area of the head. She reports they are an 8 on the pain scale. States nothing relieves headache.      ASSESSMENT AND PLAN  Jillian Bishop is a 26 y.o. 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  Laboratory in May 2022, A1c 5.9, elevated LDL 101, normal CMP and CBC MEDICAL HISTORY:  Jillian Bishop, is a 26 year old female, seen in request by her primary care nurse practitioner Minette Brine, Amy for evaluation of chronic headache, initial evaluation was on December 23, 2021  I reviewed and summarized the referring note. PMHx.  She is a mother of 4 children at age 39 79, 80, 16 years old, denies significant headache during her pregnancy, she also suffered obesity, recently started weight loss program, lost more than 55 pounds over the past 5 months, there is drastic change in her diet habit.  Around the same time since August 2022, she began to notice frequent headaches, she had long history of migraine headaches since middle school, usually started occipital region, throbbing headache with light noise sensitivity, nauseous, headache can last for few hours to whole day,  used to respond to over-the-counter medication use, only happening 1-2 times each week  Now she is having headache 3-4 times each way, taking daily over-the-counter medication, either Tylenol or ibuprofen, without significant benefit  She also reported strong family history of intracranial aneurysm, sister died of intracranial aneurysm bleeding at age 90, father also was diagnosed with intracranial aneurysm in his 27s  She denies visual change, last optometry evaluation January 2022 was reported normal, denies lateralized motor or sensory deficit.  PHYSICAL EXAM:   Vitals:   12/23/21 0755  BP: 113/67  Pulse: 90  Weight: 182 lb (82.6 kg)  Height: 5\' 4"  (1.626 m)   Not recorded     Body mass index is 31.24 kg/m.  PHYSICAL EXAMNIATION:  Gen: NAD, conversant, well nourised, well groomed                     Cardiovascular: Regular rate rhythm, no peripheral edema, warm, nontender. Eyes: Conjunctivae clear without exudates or hemorrhage Neck: Supple, no carotid bruits. Pulmonary: Clear to auscultation bilaterally   NEUROLOGICAL EXAM:  MENTAL STATUS: Speech:    Speech is normal; fluent and spontaneous with normal comprehension.  Cognition:     Orientation to time, place and person     Normal recent and remote memory     Normal Attention span and concentration     Normal Language, naming, repeating,spontaneous speech     Fund of knowledge   CRANIAL NERVES: CN II: Visual fields are full to confrontation. Pupils are round equal and briskly reactive to  light. CN III, IV, VI: extraocular movement are normal. No ptosis. CN V: Facial sensation is intact to light touch CN VII: Face is symmetric with normal eye closure  CN VIII: Hearing is normal to causal conversation. CN IX, X: Phonation is normal. CN XI: Head turning and shoulder shrug are intact  MOTOR: There is no pronator drift of out-stretched arms. Muscle bulk and tone are normal. Muscle strength is  normal.  REFLEXES: Reflexes are 2+ and symmetric at the biceps, triceps, knees, and ankles. Plantar responses are flexor.  SENSORY: Intact to light touch, pinprick and vibratory sensation are intact in fingers and toes.  COORDINATION: There is no trunk or limb dysmetria noted.  GAIT/STANCE: Posture is normal. Gait is steady with normal steps, base, arm swing, and turning. Heel and toe walking are normal. Tandem gait is normal.  Romberg is absent.  REVIEW OF SYSTEMS:  Full 14 system review of systems performed and notable only for as above All other review of systems were negative.   ALLERGIES: No Known Allergies  HOME MEDICATIONS: Current Outpatient Medications  Medication Sig Dispense Refill   etonogestrel (NEXPLANON) 68 MG IMPL implant 1 each by Subdermal route once. Left arm Apr 23, 2020     ibuprofen (ADVIL) 600 MG tablet      Insulin Pen Needle (PEN NEEDLES) 31G X 8 MM MISC UAD 50 each 0   ondansetron (ZOFRAN) 4 MG tablet Take 1 tablet (4 mg total) by mouth every 8 (eight) hours as needed for nausea or vomiting. 20 tablet 0   Semaglutide-Weight Management 2.4 MG/0.75ML SOAJ Inject 2.4 mg into the skin once a week. 3 mL 0   phentermine 15 MG capsule Take 1 capsule (15 mg total) by mouth every morning. 30 capsule 0   No current facility-administered medications for this visit.    PAST MEDICAL HISTORY: Past Medical History:  Diagnosis Date   Asthma    mild well controlled, no inhaler use since 2016   Benign teratoma of ovary, right    COVID-19 11/02/2019   sore throat cough, sob loss of taste and smell, cough until 03-11-2020 all symptoms resolved now   Dermoid cyst    GERD (gastroesophageal reflux disease)    Headache    tension, occ migraine   HPV (human papilloma virus) infection     PAST SURGICAL HISTORY: Past Surgical History:  Procedure Laterality Date   APPENDECTOMY  age 25 or [place] N/A 11/19/2018   Procedure: DILATATION AND  EVACUATION;  Surgeon: Paula Compton, MD;  Location: [facility];  Service: Gynecology;  Laterality: N/A;   LAPAROSCOPIC OVARIAN CYSTECTOMY Right 09/05/2020   Procedure: LAPAROSCOPIC OVARIAN CYSTECTOMY LYSIS OF ADHESISONS;  Surgeon: Sherlyn Hay, DO;  Location: [facility];  Service: Gynecology;  Laterality: Right;    FAMILY HISTORY: Family History  Problem Relation Age of Onset   Hypertension Mother    Diabetes Mother    Hypertension Father    Diabetes Father    Hypertension Sister    Heart disease Paternal Grandmother    Hypertension Paternal Grandmother    Diabetes Maternal Grandmother    Hypertension Maternal Grandmother    Diabetes Maternal Aunt    Diabetes Sister     SOCIAL HISTORY: Social History   Socioeconomic History   Marital status: Single    Spouse name: Not on file   Number of children: Not on file   Years of education: Not on file   Highest education level:  Not on file  Occupational History   Not on file  Tobacco Use   Smoking status: Never   Smokeless tobacco: Never  Vaping Use   Vaping Use: Never used  Substance and Sexual Activity   Alcohol use: No   Drug use: No   Sexual activity: Not on file  Other Topics Concern   Not on file  Social History Narrative   Not on file   Social Determinants of Health   Financial Resource Strain: Not on file  Food Insecurity: Not on file  Transportation Needs: Not on file  Physical Activity: Not on file  Stress: Not on file  Social Connections: Not on file  Intimate Partner Violence: Not on file      Marcial Pacas, M.D. Ph.D.  [facility] [address] [place], [place] [address] Ph: [phone] Fax: [phone]  CC:  Camillia Herter, NP [place],  [place] [address]  Camillia Herter, NP

## 2021-12-26 NOTE — Progress Notes (Signed)
Erroneous encounter

## 2021-12-29 ENCOUNTER — Telehealth: Payer: Self-pay | Admitting: Neurology

## 2021-12-29 NOTE — Telephone Encounter (Signed)
[employer] pending faxed notes   MCD healthy blue Josem Kaufmann: [id number] (exp. 12/29/21 to 02/26/22)

## 2021-12-30 ENCOUNTER — Other Ambulatory Visit: Payer: Self-pay

## 2021-12-30 ENCOUNTER — Encounter: Payer: Self-pay | Admitting: Family

## 2021-12-30 ENCOUNTER — Encounter: Payer: BC Managed Care – PPO | Admitting: Family

## 2021-12-30 ENCOUNTER — Other Ambulatory Visit: Payer: Self-pay | Admitting: Family

## 2021-12-30 DIAGNOSIS — Z7689 Persons encountering health services in other specified circumstances: Secondary | ICD-10-CM

## 2021-12-30 NOTE — Telephone Encounter (Signed)
I called [employer], I spoke with Claiborne Billings.  She reports that the MRI brain and the MRA head was actually improved.  The case number is [id number].  It is valid from January 23rd, 2023 to February 26, 2022.

## 2021-12-30 NOTE — Telephone Encounter (Signed)
I called [employer] iand spoke with one of the nurses and she stating it is not being approved because the MRI Brain and MRA Head being ordered as a combo.   They stated that a peer to peer can be done. The phone number is [phone]. The member ID is [id number]. The case does expire tomorrow. 12/31/21.

## 2021-12-30 NOTE — Telephone Encounter (Signed)
Please help connected for peer to peer review

## 2021-12-31 NOTE — Progress Notes (Signed)
Patient ID: Jillian Bishop, female    DOB: 1996/05/05  MRN: [id number]  CC: Weight Management Follow-Up  Subjective: Jillian Bishop is a 26 y.o. female who presents for weight management follow-up.   Her concerns today include:  WEIGHT MANAGEMENT FOLLOW-UP: 11/10/2021: - Increase Semaglutide-Weight Management from 1.7 mg weekly to 2.4 mg weekly. She is to begin the increased dosage on 12/01/2021.  01/01/2022: Doing well on current regimen, no issues/concerns.  Patient Active Problem List   Diagnosis Date Noted   Chronic migraine w/o aura w/o status migrainosus, not intractable 12/23/2021   Family history of brain aneurysm 12/23/2021   Persistent headaches 12/23/2021     Current Outpatient Medications on File Prior to Visit  Medication Sig Dispense Refill   etonogestrel (NEXPLANON) 68 MG IMPL implant 1 each by Subdermal route once. Left arm Apr 23, 2020     ibuprofen (ADVIL) 600 MG tablet      ondansetron (ZOFRAN) 4 MG tablet Take 1 tablet (4 mg total) by mouth every 8 (eight) hours as needed for nausea or vomiting. 20 tablet 0   phentermine 15 MG capsule Take 1 capsule (15 mg total) by mouth every morning. 30 capsule 0   SUMAtriptan (IMITREX) 50 MG tablet May repeat in 2 hours if headache persists or recurs. 12 tablet 11   topiramate (TOPAMAX) 100 MG tablet Take 1 tablet (100 mg total) by mouth 2 (two) times daily. 60 tablet 11   No current facility-administered medications on file prior to visit.    No Known Allergies  Social History   Socioeconomic History   Marital status: Single    Spouse name: Not on file   Number of children: Not on file   Years of education: Not on file   Highest education level: Not on file  Occupational History   Not on file  Tobacco Use   Smoking status: Never   Smokeless tobacco: Never  Vaping Use   Vaping Use: Never used  Substance and Sexual Activity   Alcohol use: No   Drug use: No   Sexual activity: Not on file  Other Topics  Concern   Not on file  Social History Narrative   Not on file   Social Determinants of Health   Financial Resource Strain: Not on file  Food Insecurity: Not on file  Transportation Needs: Not on file  Physical Activity: Not on file  Stress: Not on file  Social Connections: Not on file  Intimate Partner Violence: Not on file    Family History  Problem Relation Age of Onset   Hypertension Mother    Diabetes Mother    Hypertension Father    Diabetes Father    Hypertension Sister    Heart disease Paternal Grandmother    Hypertension Paternal Grandmother    Diabetes Maternal Grandmother    Hypertension Maternal Grandmother    Diabetes Maternal Aunt    Diabetes Sister     Past Surgical History:  Procedure Laterality Date   APPENDECTOMY  age 15 or [place] N/A 11/19/2018   Procedure: DILATATION AND EVACUATION;  Surgeon: Paula Compton, MD;  Location: [facility];  Service: Gynecology;  Laterality: N/A;   LAPAROSCOPIC OVARIAN CYSTECTOMY Right 09/05/2020   Procedure: LAPAROSCOPIC OVARIAN CYSTECTOMY LYSIS OF ADHESISONS;  Surgeon: Sherlyn Hay, DO;  Location: [facility];  Service: Gynecology;  Laterality: Right;    ROS: Review of Systems Negative except as stated above  PHYSICAL EXAM: BP 108/70 (  BP Location: Left Arm, Patient Position: Sitting, Cuff Size: Normal)    Pulse 78    Temp 98.5 F (36.9 C)    Resp 18    Ht 5' 4.02" (1.626 m)    Wt 176 lb 6.4 oz (80 kg)    SpO2 98%    BMI 30.26 kg/m   Wt Readings from Last 3 Encounters:  01/01/22 176 lb 6.4 oz (80 kg)  12/23/21 182 lb (82.6 kg)  11/10/21 184 lb (83.5 kg)    Physical Exam HENT:     Head: Normocephalic and atraumatic.  Eyes:     Extraocular Movements: Extraocular movements intact.     Conjunctiva/sclera: Conjunctivae normal.     Pupils: Pupils are equal, round, and reactive to light.  Cardiovascular:     Rate and Rhythm: Normal rate and regular rhythm.     Pulses:  Normal pulses.     Heart sounds: Normal heart sounds.  Pulmonary:     Effort: Pulmonary effort is normal.     Breath sounds: Normal breath sounds.  Musculoskeletal:     Cervical back: Normal range of motion and neck supple.  Neurological:     General: No focal deficit present.     Mental Status: She is alert and oriented to person, place, and time.  Psychiatric:        Mood and Affect: Mood normal.        Behavior: Behavior normal.   ASSESSMENT AND PLAN: 1. Encounter for weight management: - Patient continuing to do well with weight management, lost 6 pounds since previous appointment. - Continue Semaglutide-Weight Management as prescribed. Discussed medication compliance and adverse effects.  - Follow-up with primary provider as scheduled. - Semaglutide-Weight Management 2.4 MG/0.75ML SOAJ; Inject 2.4 mg into the skin once a week.  Dispense: 9 mL; Refill: 0 - Insulin Pen Needle (PEN NEEDLES) 31G X 8 MM MISC; UAD  Dispense: 50 each; Refill: 0   Patient was given the opportunity to ask questions.  Patient verbalized understanding of the plan and was able to repeat key elements of the plan. Patient was given clear instructions to go to Emergency Department or return to medical center if symptoms don't improve, worsen, or new problems develop.The patient verbalized understanding.   Requested Prescriptions   Signed Prescriptions Disp Refills   Semaglutide-Weight Management 2.4 MG/0.75ML SOAJ 9 mL 0    Sig: Inject 2.4 mg into the skin once a week.   Insulin Pen Needle (PEN NEEDLES) 31G X 8 MM MISC 50 each 0    Sig: UAD    Follow-up with primary provider as scheduled.  Camillia Herter, NP

## 2022-01-01 ENCOUNTER — Encounter: Payer: Self-pay | Admitting: Family

## 2022-01-01 ENCOUNTER — Ambulatory Visit (INDEPENDENT_AMBULATORY_CARE_PROVIDER_SITE_OTHER): Payer: BC Managed Care – PPO | Admitting: Family

## 2022-01-01 ENCOUNTER — Other Ambulatory Visit: Payer: Self-pay

## 2022-01-01 VITALS — BP 108/70 | HR 78 | Temp 98.5°F | Resp 18 | Ht 64.02 in | Wt 176.4 lb

## 2022-01-01 DIAGNOSIS — Z7689 Persons encountering health services in other specified circumstances: Secondary | ICD-10-CM | POA: Diagnosis not present

## 2022-01-01 MED ORDER — PEN NEEDLES 31G X 8 MM MISC: 0 refills | Status: DC

## 2022-01-01 MED ORDER — SEMAGLUTIDE-WEIGHT MANAGEMENT 2.4 MG/0.75ML ~~LOC~~ SOAJ: 2.4000 mg | SUBCUTANEOUS | 0 refills | Status: DC

## 2022-01-01 NOTE — Progress Notes (Signed)
Pt presents for weight management follow-up, needs refill on Wegovy

## 2022-01-02 DIAGNOSIS — U071 COVID-19: Secondary | ICD-10-CM | POA: Diagnosis not present

## 2022-01-02 DIAGNOSIS — Z20822 Contact with and (suspected) exposure to covid-19: Secondary | ICD-10-CM | POA: Diagnosis not present

## 2022-01-03 DIAGNOSIS — Z20822 Contact with and (suspected) exposure to covid-19: Secondary | ICD-10-CM | POA: Diagnosis not present

## 2022-01-08 ENCOUNTER — Encounter: Payer: Self-pay | Admitting: Neurology

## 2022-01-14 NOTE — Telephone Encounter (Signed)
I have done peer to peer reviewed.  MRI, MRA were approved [id number], valid till March 9th 2023

## 2022-01-14 NOTE — Telephone Encounter (Signed)
Noted, thank you. Order has been sent to GI, they will reach out to the patient to schedule.

## 2022-01-16 ENCOUNTER — Encounter: Payer: Self-pay | Admitting: Family

## 2022-01-16 ENCOUNTER — Other Ambulatory Visit: Payer: Self-pay | Admitting: Family

## 2022-01-16 DIAGNOSIS — N926 Irregular menstruation, unspecified: Secondary | ICD-10-CM

## 2022-01-16 DIAGNOSIS — Z975 Presence of (intrauterine) contraceptive device: Secondary | ICD-10-CM

## 2022-01-18 ENCOUNTER — Ambulatory Visit
Admission: RE | Admit: 2022-01-18 | Discharge: 2022-01-18 | Disposition: A | Discharge status: Home / Self Care | Payer: Medicaid Other | Source: Ambulatory Visit | Attending: Neurology | Admitting: Neurology

## 2022-01-18 ENCOUNTER — Other Ambulatory Visit: Payer: Self-pay

## 2022-01-18 DIAGNOSIS — R519 Headache, unspecified: Secondary | ICD-10-CM

## 2022-01-18 DIAGNOSIS — G43709 Chronic migraine without aura, not intractable, without status migrainosus: Secondary | ICD-10-CM

## 2022-01-18 DIAGNOSIS — Z8249 Family history of ischemic heart disease and other diseases of the circulatory system: Secondary | ICD-10-CM

## 2022-01-20 ENCOUNTER — Other Ambulatory Visit: Payer: Self-pay | Admitting: Family

## 2022-01-20 DIAGNOSIS — Z7689 Persons encountering health services in other specified circumstances: Secondary | ICD-10-CM

## 2022-01-21 ENCOUNTER — Encounter: Payer: Self-pay | Admitting: Family

## 2022-01-28 DIAGNOSIS — Z130 Encounter for screening for diseases of the blood and blood-forming organs and certain disorders involving the immune mechanism: Secondary | ICD-10-CM | POA: Diagnosis not present

## 2022-01-28 DIAGNOSIS — Z124 Encounter for screening for malignant neoplasm of cervix: Secondary | ICD-10-CM | POA: Diagnosis not present

## 2022-01-28 DIAGNOSIS — Z202 Contact with and (suspected) exposure to infections with a predominantly sexual mode of transmission: Secondary | ICD-10-CM | POA: Diagnosis not present

## 2022-01-28 DIAGNOSIS — Z6830 Body mass index (BMI) 30.0-30.9, adult: Secondary | ICD-10-CM | POA: Diagnosis not present

## 2022-01-28 DIAGNOSIS — Z01411 Encounter for gynecological examination (general) (routine) with abnormal findings: Secondary | ICD-10-CM | POA: Diagnosis not present

## 2022-02-01 DIAGNOSIS — L02412 Cutaneous abscess of left axilla: Secondary | ICD-10-CM | POA: Diagnosis not present

## 2022-02-01 DIAGNOSIS — L042 Acute lymphadenitis of upper limb: Secondary | ICD-10-CM | POA: Diagnosis not present

## 2022-02-04 DIAGNOSIS — Z30016 Encounter for initial prescription of transdermal patch hormonal contraceptive device: Secondary | ICD-10-CM | POA: Diagnosis not present

## 2022-02-04 DIAGNOSIS — Z3046 Encounter for surveillance of implantable subdermal contraceptive: Secondary | ICD-10-CM | POA: Diagnosis not present

## 2022-02-20 ENCOUNTER — Ambulatory Visit: Payer: Medicaid Other | Admitting: Obstetrics & Gynecology

## 2022-02-20 NOTE — Progress Notes (Deleted)
Last Pap: 06/24/2015 ? ?

## 2022-03-18 NOTE — Progress Notes (Signed)
? ? ?Patient ID: Jillian Bishop, female    DOB: May 26, 1996  MRN: [id number] ? ?CC: Weight Management Follow-Up ? ?Subjective: ?Jillian Bishop is a 26 y.o. female who presents for weight management follow-up.  ? ?Her concerns today include:  ?WEIGHT MANAGEMENT FOLLOW-UP: ?Doing well on current regimen. Goal weight 140 pounds. Continuing to monitor what she eats. Reports plans to begin a new job soon. Requesting refills to last at least 90 days to cover until new health insurance approved. No further issues or concerns.  ? ? ?Patient Active Problem List  ? Diagnosis Date Noted  ? Chronic migraine w/o aura w/o status migrainosus, not intractable 12/23/2021  ? Family history of brain aneurysm 12/23/2021  ? Persistent headaches 12/23/2021  ?  ? ?Current Outpatient Medications on File Prior to Visit  ?Medication Sig Dispense Refill  ? etonogestrel (NEXPLANON) 68 MG IMPL implant 1 each by Subdermal route once. Left arm Apr 23, 2020    ? ibuprofen (ADVIL) 600 MG tablet     ? ondansetron (ZOFRAN) 4 MG tablet Take 1 tablet (4 mg total) by mouth every 8 (eight) hours as needed for nausea or vomiting. 20 tablet 0  ? phentermine 15 MG capsule Take 1 capsule (15 mg total) by mouth every morning. 30 capsule 0  ? SUMAtriptan (IMITREX) 50 MG tablet May repeat in 2 hours if headache persists or recurs. 12 tablet 11  ? topiramate (TOPAMAX) 100 MG tablet Take 1 tablet (100 mg total) by mouth 2 (two) times daily. 60 tablet 11  ? ?No current facility-administered medications on file prior to visit.  ? ? ?No Known Allergies ? ?Social History  ? ?Socioeconomic History  ? Marital status: Single  ?  Spouse name: Not on file  ? Number of children: Not on file  ? Years of education: Not on file  ? Highest education level: Not on file  ?Occupational History  ? Not on file  ?Tobacco Use  ? Smoking status: Never  ?  Passive exposure: Never  ? Smokeless tobacco: Never  ?Vaping Use  ? Vaping Use: Never used  ?Substance and Sexual Activity  ? Alcohol  use: No  ? Drug use: No  ? Sexual activity: Not on file  ?Other Topics Concern  ? Not on file  ?Social History Narrative  ? Not on file  ? ?Social Determinants of Health  ? ?Financial Resource Strain: Not on file  ?Food Insecurity: Not on file  ?Transportation Needs: Not on file  ?Physical Activity: Not on file  ?Stress: Not on file  ?Social Connections: Not on file  ?Intimate Partner Violence: Not on file  ? ? ?Family History  ?Problem Relation Age of Onset  ? Hypertension Mother   ? Diabetes Mother   ? Hypertension Father   ? Diabetes Father   ? Hypertension Sister   ? Heart disease Paternal Grandmother   ? Hypertension Paternal Grandmother   ? Diabetes Maternal Grandmother   ? Hypertension Maternal Grandmother   ? Diabetes Maternal Aunt   ? Diabetes Sister   ? ? ?Past Surgical History:  ?Procedure Laterality Date  ? APPENDECTOMY  age 62 or 47  ? DILATION AND EVACUATION N/A 11/19/2018  ? Procedure: DILATATION AND EVACUATION;  Surgeon: Paula Compton, MD;  Location: [facility];  Service: Gynecology;  Laterality: N/A;  ? LAPAROSCOPIC OVARIAN CYSTECTOMY Right 09/05/2020  ? Procedure: LAPAROSCOPIC OVARIAN CYSTECTOMY LYSIS OF ADHESISONS;  Surgeon: Sherlyn Hay, DO;  Location: [facility];  Service: Gynecology;  Laterality: Right;  ? ? ?ROS: ?Review of Systems ?Negative except as stated above ? ?PHYSICAL EXAM: ?BP 107/74 (BP Location: Left Arm, Patient Position: Sitting, Cuff Size: Large)   Pulse 87   Temp 98.3 ?F (36.8 ?C)   Resp 18   Ht 5' 4.02" (1.626 m)   Wt 164 lb (74.4 kg)   SpO2 97%   BMI 28.14 kg/m?  ? ?Wt Readings from Last 3 Encounters:  ?03/19/22 164 lb (74.4 kg)  ?01/01/22 176 lb 6.4 oz (80 kg)  ?12/23/21 182 lb (82.6 kg)  ? ?Physical Exam ?HENT:  ?   Head: Normocephalic and atraumatic.  ?Eyes:  ?   Extraocular Movements: Extraocular movements intact.  ?   Conjunctiva/sclera: Conjunctivae normal.  ?   Pupils: Pupils are equal, round, and reactive to light.  ?Cardiovascular:  ?    Rate and Rhythm: Normal rate and regular rhythm.  ?   Pulses: Normal pulses.  ?   Heart sounds: Normal heart sounds. No murmur heard. ?Pulmonary:  ?   Effort: Pulmonary effort is normal.  ?   Breath sounds: Normal breath sounds.  ?Musculoskeletal:  ?   Cervical back: Normal range of motion and neck supple.  ?Neurological:  ?   General: No focal deficit present.  ?   Mental Status: She is alert and oriented to person, place, and time.  ?Psychiatric:     ?   Mood and Affect: Mood normal.     ?   Behavior: Behavior normal.  ? ?ASSESSMENT AND PLAN: ?1. Encounter for weight management: ?- Patient doing great with weight loss. Has lost 12 pounds since last appointment.  ?- Continue Semaglutide-Weight Management as prescribed. Prescribed 3 month supply as requested.  ?- Follow-up with primary provider in 3 months or sooner if needed.  ?- Semaglutide-Weight Management 2.4 MG/0.75ML SOAJ; Inject 2.4 mg into the skin once a week.  Dispense: 9 mL; Refill: 0 ?- Insulin Pen Needle (PEN NEEDLES) 31G X 8 MM MISC; UAD  Dispense: 50 each; Refill: 0 ? ? ?Patient was given the opportunity to ask questions.  Patient verbalized understanding of the plan and was able to repeat key elements of the plan. Patient was given clear instructions to go to Emergency Department or return to medical center if symptoms don't improve, worsen, or new problems develop.The patient verbalized understanding. ? ? ?Requested Prescriptions  ? ?Signed Prescriptions Disp Refills  ? Semaglutide-Weight Management 2.4 MG/0.75ML SOAJ 9 mL 0  ?  Sig: Inject 2.4 mg into the skin once a week.  ? Insulin Pen Needle (PEN NEEDLES) 31G X 8 MM MISC 50 each 0  ?  Sig: UAD  ? ? ?Return in about 3 months (around 06/18/2022) for Follow-Up or next available weight check . ? ?Camillia Herter, NP  ?

## 2022-03-19 ENCOUNTER — Encounter: Payer: Self-pay | Admitting: Family

## 2022-03-19 ENCOUNTER — Ambulatory Visit (INDEPENDENT_AMBULATORY_CARE_PROVIDER_SITE_OTHER): Payer: BC Managed Care – PPO | Admitting: Family

## 2022-03-19 VITALS — BP 107/74 | HR 87 | Temp 98.3°F | Resp 18 | Ht 64.02 in | Wt 164.0 lb

## 2022-03-19 DIAGNOSIS — Z7689 Persons encountering health services in other specified circumstances: Secondary | ICD-10-CM

## 2022-03-19 MED ORDER — SEMAGLUTIDE-WEIGHT MANAGEMENT 2.4 MG/0.75ML ~~LOC~~ SOAJ: 2.4000 mg | SUBCUTANEOUS | 0 refills | Status: DC

## 2022-03-19 MED ORDER — PEN NEEDLES 31G X 8 MM MISC: 0 refills | Status: DC

## 2022-03-19 NOTE — Progress Notes (Signed)
Pt presents for weight check  ?last weight 176lb pt down to 164lb 12lb weight loss ? ?Pt states she is about to switch jobs and request 3 month supply on medication  ?

## 2022-03-25 ENCOUNTER — Other Ambulatory Visit: Payer: Self-pay

## 2022-03-25 ENCOUNTER — Encounter: Payer: Self-pay | Admitting: Family

## 2022-03-25 DIAGNOSIS — Z7689 Persons encountering health services in other specified circumstances: Secondary | ICD-10-CM

## 2022-03-25 MED ORDER — SEMAGLUTIDE-WEIGHT MANAGEMENT 2.4 MG/0.75ML ~~LOC~~ SOAJ: 2.4000 mg | SUBCUTANEOUS | 0 refills | Status: DC

## 2022-06-11 NOTE — Progress Notes (Signed)
Patient ID: Jillian Bishop, female    DOB: [birth year]  MRN: [id number]  CC: Weight Check   Subjective: Jillian Bishop is a 26 y.o. female who presents for weight check.  Her concerns today include:  - Doing well on current Wegovy. Monitoring what she eats and exercising. - Feeling fatigued. Getting enough sleep. Concerned for low Vitamin B12.  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     ibuprofen (ADVIL) 600 MG tablet      ondansetron (ZOFRAN) 4 MG tablet Take 1 tablet (4 mg total) by mouth every 8 (eight) hours as needed for nausea or vomiting. 20 tablet 0   SUMAtriptan (IMITREX) 50 MG tablet May repeat in 2 hours if headache persists or recurs. 12 tablet 11   topiramate (TOPAMAX) 100 MG tablet Take 1 tablet (100 mg total) by mouth 2 (two) times daily. 60 tablet 11   ZAFEMY 150-35 MCG/24HR transdermal patch Place onto the skin.     No current facility-administered medications on file prior to visit.    No Known Allergies  Social History   Socioeconomic History   Marital status: Single    Spouse name: Not on file   Number of children: Not on file   Years of education: Not on file   Highest education level: Not on file  Occupational History   Not on file  Tobacco Use   Smoking status: Never    Passive exposure: Never   Smokeless tobacco: Never  Vaping Use   Vaping Use: Never used  Substance and Sexual Activity   Alcohol use: No   Drug use: No   Sexual activity: Not on file  Other Topics Concern   Not  on file  Social History Narrative   Not on file   Social Determinants of Health   Financial Resource Strain: Not on file  Food Insecurity: Not on file  Transportation Needs: Not on file  Physical Activity: Not on file  Stress: Not on file  Social Connections: Not on file  Intimate Partner Violence: Not on file    Family History  Problem Relation Age of Onset   Hypertension Mother    Diabetes Mother    Hypertension Father    Diabetes Father    Hypertension Sister    Heart disease Paternal Grandmother    Hypertension Paternal Grandmother    Diabetes Maternal Grandmother    Hypertension Maternal Grandmother    Diabetes Maternal Aunt    Diabetes Sister     Past Surgical History:  Procedure Laterality Date   APPENDECTOMY  age 34 or [place] N/A 11/19/2018   Procedure: DILATATION AND EVACUATION;  Surgeon: Paula Compton, MD;  Location: [facility];  Service: Gynecology;  Laterality: N/A;   LAPAROSCOPIC OVARIAN CYSTECTOMY Right 09/05/2020   Procedure: LAPAROSCOPIC OVARIAN CYSTECTOMY LYSIS OF ADHESISONS;  Surgeon: Sherlyn Hay, DO;  Location: [facility];  Service: Gynecology;  Laterality: Right;    ROS: Review of Systems Negative except  as stated above  PHYSICAL EXAM: BP 116/79 (BP Location: Left Arm, Patient Position: Sitting, Cuff Size: Normal)   Pulse 84   Temp 98.3 F (36.8 C)   Resp 16   Ht 5' 4.02" (1.626 m)   Wt 154 lb (69.9 kg)   SpO2 99%   BMI 26.42 kg/m   Wt Readings from Last 3 Encounters:  06/18/22 154 lb (69.9 kg)  03/19/22 164 lb (74.4 kg)  01/01/22 176 lb 6.4 oz (80 kg)    Physical Exam HENT:     Head: Normocephalic and atraumatic.  Eyes:     Extraocular Movements: Extraocular movements intact.     Conjunctiva/sclera: Conjunctivae normal.     Pupils: Pupils are equal, round, and reactive to light.  Cardiovascular:     Rate and Rhythm: Normal rate and regular rhythm.     Pulses: Normal pulses.      Heart sounds: Normal heart sounds.  Pulmonary:     Effort: Pulmonary effort is normal.     Breath sounds: Normal breath sounds.  Musculoskeletal:     Cervical back: Normal range of motion and neck supple.  Neurological:     General: No focal deficit present.     Mental Status: She is alert and oriented to person, place, and time.  Psychiatric:        Mood and Affect: Mood normal.        Behavior: Behavior normal.     ASSESSMENT AND PLAN: 1. Encounter for weight management - Patient doing well with weight management. Lost 10 pounds since last appointment.  - Continue Semaglutide-Weight Management as prescribed.  - Follow-up with primary provider in 3 months or sooner if needed.  - Semaglutide-Weight Management 2.4 MG/0.75ML SOAJ; Inject 2.4 mg into the skin once a week.  Dispense: 9 mL; Refill: 0  2. Fatigue, unspecified type - Screening labs. - Vitamin D, 25-hydroxy - Vitamin B12 - CMP14+EGFR - CBC - Hemoglobin A1c - TSH    Patient was given the opportunity to ask questions.  Patient verbalized understanding of the plan and was able to repeat key elements of the plan. Patient was given clear instructions to go to Emergency Department or return to medical center if symptoms don't improve, worsen, or new problems develop.The patient verbalized understanding.   Orders Placed This Encounter  Procedures   Vitamin D, 25-hydroxy   Vitamin B12   CMP14+EGFR   CBC   Hemoglobin A1c   TSH     Requested Prescriptions   Signed Prescriptions Disp Refills   Semaglutide-Weight Management 2.4 MG/0.75ML SOAJ 9 mL 0    Sig: Inject 2.4 mg into the skin once a week.    Return in about 3 months (around 09/18/2022) for Follow-Up or next available weight check.  Camillia Herter, NP

## 2022-06-12 DIAGNOSIS — Z111 Encounter for screening for respiratory tuberculosis: Secondary | ICD-10-CM | POA: Diagnosis not present

## 2022-06-15 DIAGNOSIS — Z111 Encounter for screening for respiratory tuberculosis: Secondary | ICD-10-CM | POA: Diagnosis not present

## 2022-06-16 ENCOUNTER — Other Ambulatory Visit: Payer: Self-pay | Admitting: Family

## 2022-06-16 DIAGNOSIS — Z7689 Persons encountering health services in other specified circumstances: Secondary | ICD-10-CM

## 2022-06-17 ENCOUNTER — Encounter: Payer: Self-pay | Admitting: *Deleted

## 2022-06-18 ENCOUNTER — Ambulatory Visit (INDEPENDENT_AMBULATORY_CARE_PROVIDER_SITE_OTHER): Payer: BC Managed Care – PPO | Admitting: Family

## 2022-06-18 ENCOUNTER — Encounter: Payer: Self-pay | Admitting: Family

## 2022-06-18 VITALS — BP 116/79 | HR 84 | Temp 98.3°F | Resp 16 | Ht 64.02 in | Wt 154.0 lb

## 2022-06-18 DIAGNOSIS — R5383 Other fatigue: Secondary | ICD-10-CM | POA: Diagnosis not present

## 2022-06-18 DIAGNOSIS — Z7689 Persons encountering health services in other specified circumstances: Secondary | ICD-10-CM | POA: Diagnosis not present

## 2022-06-18 DIAGNOSIS — D279 Benign neoplasm of unspecified ovary: Secondary | ICD-10-CM | POA: Insufficient documentation

## 2022-06-18 DIAGNOSIS — E079 Disorder of thyroid, unspecified: Secondary | ICD-10-CM | POA: Insufficient documentation

## 2022-06-18 DIAGNOSIS — J45909 Unspecified asthma, uncomplicated: Secondary | ICD-10-CM | POA: Insufficient documentation

## 2022-06-18 MED ORDER — SEMAGLUTIDE-WEIGHT MANAGEMENT 2.4 MG/0.75ML ~~LOC~~ SOAJ: 2.4000 mg | SUBCUTANEOUS | 0 refills | Status: DC

## 2022-06-18 NOTE — Progress Notes (Signed)
Established Patient Office Visit  Subjective   Patient ID: ASMAA TIRPAK, female    DOB: 1996/10/23  Age: 26 y.o. MRN: [id number]  Chief Complaint  Patient presents with   Weight Check       Objective:    Pt presents for weight check  There were no vitals taken for this visit. Wt Readings from Last 3 Encounters:  03/19/22 164 lb (74.4 kg)  01/01/22 176 lb 6.4 oz (80 kg)  12/23/21 182 lb (82.6 kg)         No results found for any visits on 06/18/22.    The ASCVD Risk score (Arnett DK, et al., 2019) failed to calculate for the following reasons:   The 2019 ASCVD risk score is only valid for ages 58 to 64    Assessment & Plan:   Problem List Items Addressed This Visit   None Visit Diagnoses     Encounter for weight management    -  Primary       No follow-ups on file.    Elmon Else, CMA

## 2022-06-18 NOTE — Telephone Encounter (Signed)
Jillian Bishop is reserved for diabetic patients.

## 2022-06-18 NOTE — Telephone Encounter (Signed)
Please advise patient.  

## 2022-06-19 ENCOUNTER — Other Ambulatory Visit: Payer: Self-pay | Admitting: Family

## 2022-06-19 ENCOUNTER — Encounter: Payer: Self-pay | Admitting: Family

## 2022-06-19 DIAGNOSIS — E559 Vitamin D deficiency, unspecified: Secondary | ICD-10-CM | POA: Insufficient documentation

## 2022-06-19 DIAGNOSIS — Z7689 Persons encountering health services in other specified circumstances: Secondary | ICD-10-CM

## 2022-06-19 LAB — CMP14+EGFR
ALT: 10 IU/L (ref 0–32)
AST: 10 IU/L (ref 0–40)
Albumin/Globulin Ratio: 1.4 (ref 1.2–2.2)
Albumin: 3.8 g/dL — ABNORMAL LOW (ref 4.0–5.0)
Alkaline Phosphatase: 40 IU/L — ABNORMAL LOW (ref 44–121)
BUN/Creatinine Ratio: 18 (ref 9–23)
BUN: 10 mg/dL (ref 6–20)
Bilirubin Total: 0.2 mg/dL (ref 0.0–1.2)
CO2: 19 mmol/L — ABNORMAL LOW (ref 20–29)
Calcium: 8.4 mg/dL — ABNORMAL LOW (ref 8.7–10.2)
Chloride: 104 mmol/L (ref 96–106)
Creatinine, Ser: 0.56 mg/dL — ABNORMAL LOW (ref 0.57–1.00)
Globulin, Total: 2.8 g/dL (ref 1.5–4.5)
Sodium: 140 mmol/L (ref 134–144)
Total Protein: 6.6 g/dL (ref 6.0–8.5)
eGFR: 127 mL/min/{1.73_m2} (ref >59)

## 2022-06-19 LAB — HEMOGLOBIN A1C
Est. average glucose Bld gHb Est-mCnc: 103 mg/dL
Hgb A1c MFr Bld: 5.2 % (ref 4.8–5.6)

## 2022-06-19 LAB — CBC
Hematocrit: 36.3 % (ref 34.0–46.6)
Hemoglobin: 11.3 g/dL (ref 11.1–15.9)
MCH: 26.1 pg — ABNORMAL LOW (ref 26.6–33.0)
MCHC: 31.1 g/dL — ABNORMAL LOW (ref 31.5–35.7)
MCV: 84 fL (ref 79–97)
Platelets: 338 10*3/uL (ref 150–450)
RBC: 4.33 x10E6/uL (ref 3.77–5.28)
RDW: 13.2 % (ref 11.7–15.4)
WBC: 5.5 10*3/uL (ref 3.4–10.8)

## 2022-06-19 LAB — TSH: TSH: 1.79 u[IU]/mL (ref 0.450–4.500)

## 2022-06-19 LAB — VITAMIN B12: Vitamin B-12: 326 pg/mL (ref 232–1245)

## 2022-06-19 LAB — VITAMIN D 25 HYDROXY (VIT D DEFICIENCY, FRACTURES): Vit D, 25-Hydroxy: 16.8 ng/mL — ABNORMAL LOW (ref 30.0–100.0)

## 2022-06-19 MED ORDER — VITAMIN D (ERGOCALCIFEROL) 1.25 MG (50000 UNIT) PO CAPS: 50000.0000 [IU] | ORAL_CAPSULE | ORAL | 2 refills | Status: AC

## 2022-06-19 MED ORDER — SEMAGLUTIDE-WEIGHT MANAGEMENT 1.7 MG/0.75ML ~~LOC~~ SOAJ: 1.7000 mg | SUBCUTANEOUS | 2 refills | Status: DC

## 2022-06-19 NOTE — Telephone Encounter (Signed)
Order complete. 

## 2022-06-19 NOTE — Telephone Encounter (Addendum)
Spoke w/pt to advise the only other alternative would be for Saxenda for weight loss, [name] is for diabetic pts. Pt request 1.'7mg'$  of Wegovy be sent over to [facility] on [place]

## 2022-06-22 ENCOUNTER — Encounter: Payer: Self-pay | Admitting: Family

## 2022-06-22 NOTE — Progress Notes (Deleted)
[facility] [address] [place]. [place] [address] [phone]       OFFICE FOLLOW UP NOTE  Ms. [name] Date of Birth:  [birth year] Medical Record Number:  [id number]    Primary neurologist: Dr. Krista Blue Reason for visit: Persistent headaches    SUBJECTIVE:   CHIEF COMPLAINT:  No chief complaint on file.   HPI:   Update 06/22/2022 JM: Patient returns for chronic headache follow-up after prior initial consult visit with Dr. Krista Blue 6 months ago.  Initiated topiramate 100 mg twice daily and use of sumatriptan as needed at prior visit.  Reports continued use of topiramate ***. Migraines currently ***.  Use of sumatriptan ***.  Completed MRI brain and MRA head 01/2022 which were both unremarkable.      MRI brain wo contrast 01/18/2022 IMPRESSION:  Unremarkable MRI brain (with and without). No acute findings.   MRA head wo contrast 01/18/2022 IMPRESSION:  Unremarkable MRA head without contrast. No definite aneurysms, stenoses or occlusions.    History provided for reference purposes only Consult visit 12/23/2021 Dr. Krista Blue: [name], is a 26 year old female, seen in request by her primary care nurse practitioner Minette Brine, Amy for evaluation of chronic headache, initial evaluation was on December 23, 2021   I reviewed and summarized the referring note. PMHx.   She is a mother of 4 children at age 67 93, 63, 31 years old, denies significant headache during her pregnancy, she also suffered obesity, recently started weight loss program, lost more than 55 pounds over the past 5 months, there is drastic change in her diet habit.  Around the same time since August 2022, she began to notice frequent headaches, she had long history of migraine headaches since middle school, usually started occipital region, throbbing headache with light noise sensitivity, nauseous, headache can last for few hours to whole day, used to respond to over-the-counter medication use, only  happening 1-2 times each week  Now she is having headache 3-4 times each way, taking daily over-the-counter medication, either Tylenol or ibuprofen, without significant benefit  She also reported strong family history of intracranial aneurysm, sister died of intracranial aneurysm bleeding at age 63, father also was diagnosed with intracranial aneurysm in his 75s  She denies visual change, last optometry evaluation January 2022 was reported normal, denies lateralized motor or sensory deficit.      ROS:   14 system review of systems performed and negative with exception of ***  PMH:  Past Medical History:  Diagnosis Date   Asthma    mild well controlled, no inhaler use since 2016   Benign teratoma of ovary, right    COVID-19 11/02/2019   sore throat cough, sob loss of taste and smell, cough until 03-11-2020 all symptoms resolved now   Dermoid cyst    GERD (gastroesophageal reflux disease)    Headache    tension, occ migraine   HPV (human papilloma virus) infection     PSH:  Past Surgical History:  Procedure Laterality Date   APPENDECTOMY  age 39 or [place] N/A 11/19/2018   Procedure: DILATATION AND EVACUATION;  Surgeon: Paula Compton, MD;  Location: [facility];  Service: Gynecology;  Laterality: N/A;   LAPAROSCOPIC OVARIAN CYSTECTOMY Right 09/05/2020   Procedure: LAPAROSCOPIC OVARIAN CYSTECTOMY LYSIS OF ADHESISONS;  Surgeon: Sherlyn Hay, DO;  Location: [facility];  Service: Gynecology;  Laterality: Right;    Social History:  Social History   Socioeconomic History  Marital status: Single    Spouse name: Not on file   Number of children: Not on file   Years of education: Not on file   Highest education level: Not on file  Occupational History   Not on file  Tobacco Use   Smoking status: Never    Passive exposure: Never   Smokeless tobacco: Never  Vaping Use   Vaping Use: Never used  Substance and Sexual Activity    Alcohol use: No   Drug use: No   Sexual activity: Not on file  Other Topics Concern   Not on file  Social History Narrative   Not on file   Social Determinants of Health   Financial Resource Strain: Not on file  Food Insecurity: Not on file  Transportation Needs: Not on file  Physical Activity: Not on file  Stress: Not on file  Social Connections: Not on file  Intimate Partner Violence: Not on file    Family History:  Family History  Problem Relation Age of Onset   Hypertension Mother    Diabetes Mother    Hypertension Father    Diabetes Father    Hypertension Sister    Heart disease Paternal Grandmother    Hypertension Paternal Grandmother    Diabetes Maternal Grandmother    Hypertension Maternal Grandmother    Diabetes Maternal Aunt    Diabetes Sister     Medications:   Current Outpatient Medications on File Prior to Visit  Medication Sig Dispense Refill   ibuprofen (ADVIL) 400 MG tablet Take 400 mg by mouth 3 (three) times daily as needed.     ibuprofen (ADVIL) 600 MG tablet      ondansetron (ZOFRAN) 4 MG tablet Take 1 tablet (4 mg total) by mouth every 8 (eight) hours as needed for nausea or vomiting. 20 tablet 0   Semaglutide-Weight Management 1.7 MG/0.75ML SOAJ Inject 1.7 mg into the skin once a week. 3 mL 2   SUMAtriptan (IMITREX) 50 MG tablet May repeat in 2 hours if headache persists or recurs. 12 tablet 11   topiramate (TOPAMAX) 100 MG tablet Take 1 tablet (100 mg total) by mouth 2 (two) times daily. 60 tablet 11   Vitamin D, Ergocalciferol, (DRISDOL) 1.25 MG (50000 UNIT) CAPS capsule Take 1 capsule (50,000 Units total) by mouth every 7 (seven) days for 12 doses. 4 capsule 2   ZAFEMY 150-35 MCG/24HR transdermal patch Place onto the skin.     No current facility-administered medications on file prior to visit.    Allergies:  No Known Allergies    OBJECTIVE:  Physical Exam  There were no vitals filed for this visit. There is no height or weight on  file to calculate BMI. No results found.   General: well developed, well nourished, seated, in no evident distress Head: head normocephalic and atraumatic.   Neck: supple with no carotid or supraclavicular bruits Cardiovascular: regular rate and rhythm, no murmurs Musculoskeletal: no deformity Skin:  no rash/petichiae Vascular:  Normal pulses all extremities   Neurologic Exam Mental Status: Awake and fully alert. Oriented to place and time. Recent and remote memory intact. Attention span, concentration and fund of knowledge appropriate. Mood and affect appropriate.  Cranial Nerves: Pupils equal, briskly reactive to light. Extraocular movements full without nystagmus. Visual fields full to confrontation. Hearing intact. Facial sensation intact. Face, tongue, palate moves normally and symmetrically.  Motor: Normal bulk and tone. Normal strength in all tested extremity muscles Sensory.: intact to touch , pinprick , position and  vibratory sensation.  Coordination: Rapid alternating movements normal in all extremities. Finger-to-nose and heel-to-shin performed accurately bilaterally. Gait and Station: Arises from chair without difficulty. Stance is normal. Gait demonstrates normal stride length and balance without use of AD. Tandem walk and heel toe without difficulty.  Reflexes: 1+ and symmetric. Toes downgoing.         ASSESSMENT/PLAN: MILAH RECHT is a 26 y.o. year old female    1.  Chronic migraine headaches -Prevention:  topiramate 100 mg twice daily -Acute: Sumatriptan 50 mg as needed -Discussed importance of avoiding frequent use of OTC pain relief due to potential of analgesic rebound        Follow up in *** or call earlier if needed   CC:  PCP: Camillia Herter, NP    I spent *** minutes of face-to-face and non-face-to-face time with patient.  This included previsit chart review, lab review, study review, order entry, electronic health record documentation, patient  education regarding ***   Frann Rider, AGNP-BC  [facility] [address] [place], [place] [address]  Phone [phone] Fax [phone] Note: This document was prepared with digital dictation and possible smart phrase technology. Any transcriptional errors that result from this process are unintentional.

## 2022-06-23 ENCOUNTER — Encounter: Payer: Self-pay | Admitting: Adult Health

## 2022-06-23 ENCOUNTER — Ambulatory Visit: Payer: Medicaid Other | Admitting: Adult Health

## 2022-07-07 DIAGNOSIS — Z111 Encounter for screening for respiratory tuberculosis: Secondary | ICD-10-CM | POA: Diagnosis not present

## 2022-07-22 ENCOUNTER — Encounter: Payer: Self-pay | Admitting: Family

## 2022-07-22 ENCOUNTER — Telehealth: Payer: Self-pay | Admitting: Family

## 2022-07-22 NOTE — Telephone Encounter (Signed)
Pts Wegovy needs a PA/ please advise

## 2022-07-22 NOTE — Telephone Encounter (Signed)
PA will not be completed,  Unfortunately, the [facility] authorization is only approved if your BMI is 27 kg/m  or higher at your last visit your BMI was 26.42 kg/m and I will not be able to complete the prior authorization for you based off those numbers. If you would like to continue this medication it will be have to be paid out pocket. If any questions or concerns, feel free to contact our office.  HBZJIR guidelines: Diagnosis: WEGOVY is indicated as an adjunct to a reduced calorie diet and increased physical activity for chronic weight management in adult patients with an initial body mass index (BMI) of: 30 kg/m2 or greater (obesity) or  27 kg/m2 or greater (overweight) in the presence of at least one weight-related comorbid condition (e.g., hypertension, type 2 diabetes mellitus, or dyslipidemia)

## 2022-07-27 NOTE — Progress Notes (Signed)
Patient ID: AVAYA MCJUNKINS, female    DOB: [birth year]  MRN: [id number]  CC: Weight Check  Subjective: Jillian Bishop is a 26 y.o. female who presents for weight check.   Her concerns today include:  Reports gained weight recently. States she is trying to get back on track with weight loss. Reports the highest dosage of Wegovy her preferred pharmacy has is 1.7 mg dosage. No additional issues or concerns.    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     ibuprofen (ADVIL) 600 MG tablet      ondansetron (ZOFRAN) 4 MG tablet Take 1 tablet (4 mg total) by mouth every 8 (eight) hours as needed for nausea or vomiting. 20 tablet 0   SUMAtriptan (IMITREX) 50 MG tablet May repeat in 2 hours if headache persists or recurs. 12 tablet 11   topiramate (TOPAMAX) 100 MG tablet Take 1 tablet (100 mg total) by mouth 2 (two) times daily. 60 tablet 11   Vitamin D, Ergocalciferol, (DRISDOL) 1.25 MG (50000 UNIT) CAPS capsule Take 1 capsule (50,000 Units total) by mouth every 7 (seven) days for 12 doses. 4 capsule 2   ZAFEMY 150-35 MCG/24HR transdermal patch Place onto the skin.     No current facility-administered medications on file prior to visit.    No Known Allergies  Social History   Socioeconomic History   Marital status: Single    Spouse name: Not on file   Number of children: Not on file   Years of education: Not on file   Highest education level: Not on file  Occupational History   Not on  file  Tobacco Use   Smoking status: Never    Passive exposure: Never   Smokeless tobacco: Never  Vaping Use   Vaping Use: Never used  Substance and Sexual Activity   Alcohol use: No   Drug use: No   Sexual activity: Not on file  Other Topics Concern   Not on file  Social History Narrative   Not on file   Social Determinants of Health   Financial Resource Strain: Not on file  Food Insecurity: Not on file  Transportation Needs: Not on file  Physical Activity: Not on file  Stress: Not on file  Social Connections: Not on file  Intimate Partner Violence: Not on file    Family History  Problem Relation Age of Onset   Hypertension Mother    Diabetes Mother    Hypertension Father    Diabetes Father    Hypertension Sister    Heart disease Paternal Grandmother    Hypertension Paternal Grandmother    Diabetes Maternal Grandmother    Hypertension Maternal Grandmother    Diabetes Maternal Aunt    Diabetes Sister     Past Surgical History:  Procedure Laterality Date   APPENDECTOMY  age 40 or [place] N/A 11/19/2018   Procedure: DILATATION AND EVACUATION;  Surgeon: Paula Compton, MD;  Location: [facility];  Service: Gynecology;  Laterality: N/A;   LAPAROSCOPIC OVARIAN CYSTECTOMY Right 09/05/2020   Procedure: LAPAROSCOPIC OVARIAN CYSTECTOMY LYSIS OF ADHESISONS;  Surgeon: Sherlyn Hay, DO;  Location: [facility];  Service: Gynecology;  Laterality: Right;    ROS: Review of Systems Negative except as stated above  PHYSICAL EXAM: BP 112/74 (BP Location: Left Arm, Patient Position: Sitting, Cuff Size: Normal)   Pulse 86   Temp 98.3 F (36.8 C)   Resp 18   Ht 5' 4.02" (1.626 m)   Wt 170 lb (77.1 kg)   SpO2 98%   BMI 29.17 kg/m   Wt Readings from Last 3 Encounters:  08/04/22 170 lb (77.1 kg)  06/18/22 154 lb (69.9 kg)  03/19/22 164 lb (74.4 kg)   Physical Exam HENT:     Head: Normocephalic and atraumatic.  Eyes:      Extraocular Movements: Extraocular movements intact.     Conjunctiva/sclera: Conjunctivae normal.     Pupils: Pupils are equal, round, and reactive to light.  Cardiovascular:     Rate and Rhythm: Normal rate and regular rhythm.     Pulses: Normal pulses.     Heart sounds: Normal heart sounds.  Pulmonary:     Effort: Pulmonary effort is normal.     Breath sounds: Normal breath sounds.  Musculoskeletal:     Cervical back: Normal range of motion and neck supple.  Neurological:     General: No focal deficit present.     Mental Status: She is alert and oriented to person, place, and time.  Psychiatric:        Mood and Affect: Mood normal.        Behavior: Behavior normal.    ASSESSMENT AND PLAN: 1. Encounter for weight management - Semaglutide-Weight Management as prescribed.  - Follow-up with primary provider as scheduled. - Semaglutide-Weight Management 1.7 MG/0.75ML SOAJ; Inject 1.7 mg into the skin once a week.  Dispense: 3 mL; Refill: 2   Patient was given the opportunity to ask questions.  Patient verbalized understanding of the plan and was able to repeat key elements of the plan. Patient was given clear instructions to go to Emergency Department or return to medical center if symptoms don't improve, worsen, or new problems develop.The patient verbalized understanding.    Requested Prescriptions   Signed Prescriptions Disp Refills   Semaglutide-Weight Management 1.7 MG/0.75ML SOAJ 3 mL 2    Sig: Inject 1.7 mg into the skin once a week.    Follow-up with primary provider as scheduled.  Camillia Herter, NP

## 2022-08-04 ENCOUNTER — Encounter: Payer: Self-pay | Admitting: Family

## 2022-08-04 ENCOUNTER — Ambulatory Visit: Payer: Medicaid Other | Admitting: Family

## 2022-08-04 VITALS — BP 112/74 | HR 86 | Temp 98.3°F | Resp 18 | Ht 62.48 in | Wt 170.0 lb

## 2022-08-04 DIAGNOSIS — Z7689 Persons encountering health services in other specified circumstances: Secondary | ICD-10-CM

## 2022-08-04 MED ORDER — SEMAGLUTIDE-WEIGHT MANAGEMENT 1.7 MG/0.75ML ~~LOC~~ SOAJ: 1.7000 mg | SUBCUTANEOUS | 2 refills | Status: DC

## 2022-08-04 NOTE — Progress Notes (Signed)
Pt presents for weight check  -pt height updated on 08/06/22, from 5'4 to [facility]

## 2022-08-06 ENCOUNTER — Telehealth: Payer: Self-pay

## 2022-08-06 NOTE — Telephone Encounter (Signed)
Noted  

## 2022-08-06 NOTE — Telephone Encounter (Signed)
Pty states is coming by office on her lunch between 1-2 for a re ck for height/ [phone]

## 2022-08-06 NOTE — Telephone Encounter (Signed)
Att to contact pt to confirm when she can come in and do height check, pt states that she is only 5'3 but when height taken back in 07/2021 at pt physical she is documented to be 5'4  -to determine BMI

## 2022-08-06 NOTE — Telephone Encounter (Signed)
Prior authorization started for [facility] 1.'7mg'$  Key# [id number]

## 2022-09-17 DIAGNOSIS — R0789 Other chest pain: Secondary | ICD-10-CM | POA: Diagnosis not present

## 2022-09-17 DIAGNOSIS — R079 Chest pain, unspecified: Secondary | ICD-10-CM | POA: Diagnosis not present

## 2022-09-18 DIAGNOSIS — R079 Chest pain, unspecified: Secondary | ICD-10-CM | POA: Diagnosis not present

## 2022-09-22 ENCOUNTER — Encounter: Payer: Self-pay | Admitting: Family

## 2022-09-24 NOTE — Progress Notes (Deleted)
Patient ID: Jillian Bishop, female    DOB: 02-21-1996  MRN: [id number]  CC: No chief complaint on file.   Subjective: Jillian Bishop is a 26 y.o. female who presents for  Her concerns today include:   fup from [employer]  Patient message Hello, Jillian Bishop I went to [facility] on October 12th I been having real bad chest pains and it still hasn't gone away it just keeps tightening so they told me I had to follow up with you and they had took me out of work for a few days. My job said they are going to fax over some paper work that you need to fill out for me. I also made a appointment but the soonest appointment you had available is October 25th and tomorrow I have to go back to [facility] so they can put a heart monitor on me.  09/18/2022 [facility] per DO note: Chief Complaint  Patient presents with   Chest Pain   26 year old female presents to ED for evaluation of chest discomfort x2 weeks. Patient does report mildly worsening symptoms with inspiration and movement. She denies any falls or injuries. Does report that she does perform lifting for her past however denies it being particularly heavy. Patient denies any nausea or vomiting. She is on a birth control patch. Denies recent travel, flights, or lower calf swelling. Denies concerns for pregnancy. Denies fever, chills, cough, or congestion. Patient does endorse intermittent palpitations but denies any in the last 24 hours.    Radiology   Results for orders placed or performed during the hospital encounter of 09/18/22  XR CHEST AP PORTABLE  Narrative  CLINICAL DATA: Chest pain  EXAM: PORTABLE CHEST 1 VIEW  COMPARISON: None Available.  FINDINGS: The heart size and mediastinal contours are within normal limits. Both lungs are clear. The visualized skeletal structures are unremarkable.  IMPRESSION: No active disease.   Medical Decision Making    Patient's presentation is most consistent with acute presentation with potential threat to life or bodily function. .  Provider time spent in patient care today, inclusive of but not limited to clinical reassessment, review of diagnostic studies, and discharge preparation, was greater than 30 minutes.   Medical Decision Making 26 year old female presents to ED for evaluation of chest discomfort x2 weeks. Reports it is intermittent. Has occasionally noticed palpitations. Denies prior pulmonary or cardiac history. Discussed with patient her differential diagnosis is quite broad and close concerns for PE versus ACS versus pneumonia versus pneumothorax versus rib dysfunction versus electrolyte abnormalities. Patient's labs were obtained. D-dimer is less than 270. Patient has no leukocytosis. Electrolytes appear stable. Magnesium level is mildly low at 1.7. Given 2 g of IV repletion. BNP is not elevated at this time. Chest x-ray per my interpretation shows no evidence of pneumonia. EKG is nonischemic. At this time, discussed with patient that I do not believe the source of her symptoms is cardiac. Patient was given a dose of Toradol. She is advised if she still continues to have palpitation she should follow-up with cardiology and is given a referral for Zio patch placement. She is advised that her hear score is low. It is advised to return to the ED for any new or worsening symptoms.  Chest pain, unspecified type: acute illness or injury Amount and/or Complexity of Data Reviewed Labs: ordered. Radiology: ordered and independent interpretation performed. ECG/medicine tests: ordered and independent interpretation performed.  Risk Prescription  drug management.  ED Clinical Impression   1. 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     ibuprofen (ADVIL) 600 MG tablet      ondansetron (ZOFRAN) 4 MG tablet Take 1 tablet (4 mg total) by mouth every 8 (eight) hours as needed for nausea or vomiting. 20 tablet 0   Semaglutide-Weight Management 1.7 MG/0.75ML SOAJ Inject 1.7 mg into the skin once a week. 3 mL 2   SUMAtriptan (IMITREX) 50 MG tablet May repeat in 2 hours if headache persists or recurs. 12 tablet 11   topiramate (TOPAMAX) 100 MG tablet Take 1 tablet (100 mg total) by mouth 2 (two) times daily. 60 tablet 11   ZAFEMY 150-35 MCG/24HR transdermal patch Place onto the skin.     No current facility-administered medications on file prior to visit.    No Known Allergies  Social History   Socioeconomic History   Marital status: Single    Spouse name: Not on file   Number of children: Not on file   Years of education: Not on file   Highest education level: Not on file  Occupational History   Not on file  Tobacco Use   Smoking status: Never    Passive exposure: Never   Smokeless tobacco: Never  Vaping Use   Vaping Use: Never used  Substance and Sexual Activity   Alcohol use: No   Drug use: No   Sexual activity: Not on file  Other Topics Concern   Not on file  Social History Narrative   Not on file   Social Determinants of Health   Financial Resource Strain: Not on file  Food Insecurity: Not on file  Transportation Needs: Not on file  Physical Activity: Not on file  Stress: Not on file  Social Connections: Not on file   Intimate Partner Violence: Not on file    Family History  Problem Relation Age of Onset   Hypertension Mother    Diabetes Mother    Hypertension Father    Diabetes Father    Hypertension Sister    Heart disease Paternal Grandmother    Hypertension Paternal Grandmother    Diabetes Maternal Grandmother    Hypertension Maternal Grandmother    Diabetes Maternal Aunt    Diabetes Sister     Past Surgical History:  Procedure Laterality Date   APPENDECTOMY  age 87 or [place] N/A 11/19/2018   Procedure: DILATATION AND EVACUATION;  Surgeon: Paula Compton, MD;  Location: [facility];  Service: Gynecology;  Laterality: N/A;   LAPAROSCOPIC OVARIAN CYSTECTOMY Right 09/05/2020   Procedure: LAPAROSCOPIC OVARIAN CYSTECTOMY LYSIS OF ADHESISONS;  Surgeon: Sherlyn Hay, DO;  Location: [facility];  Service: Gynecology;  Laterality: Right;    ROS: Review of Systems Negative except as stated  above  PHYSICAL EXAM: There were no vitals taken for this visit.  Physical Exam  {female adult master:[id number]} {female adult master:[id number]}     Latest Ref Rng & Units 06/18/2022    9:22 AM 05/06/2021    4:14 PM 11/13/2015   11:18 AM  CMP  Glucose mg/dL CANCELED  65  80   BUN 6 - 20 mg/dL 10  9    Creatinine 0.57 - 1.00 mg/dL 0.56  0.61    Sodium 134 - 144 mmol/L 140  138    Potassium mmol/L CANCELED  4.1    Chloride 96 - 106 mmol/L 104  102    CO2 20 - 29 mmol/L 19  22    Calcium 8.7 - 10.2 mg/dL 8.4  9.3    Total Protein 6.0 - 8.5 g/dL 6.6  7.3    Total Bilirubin 0.0 - 1.2 mg/dL 0.2  <0.2    Alkaline Phos 44 - 121 IU/L 40  89    AST 0 - 40 IU/L 10  15    ALT 0 - 32 IU/L 10  19     Lipid Panel     Component Value Date/Time   CHOL 154 05/06/2021 1614   TRIG 56 05/06/2021 1614   HDL 41 05/06/2021 1614   CHOLHDL 3.8 05/06/2021 1614   LDLCALC 101 (H) 05/06/2021 1614    CBC    Component Value Date/Time   WBC 5.5 06/18/2022 0922   WBC 9.6  09/05/2020 0600   RBC 4.33 06/18/2022 0922   RBC 4.44 09/05/2020 0600   HGB 11.3 06/18/2022 0922   HGB 11.6 05/30/2015 0000   HCT 36.3 06/18/2022 0922   HCT 37 05/30/2015 0000   PLT 338 06/18/2022 0922   PLT 331 05/30/2015 0000   MCV 84 06/18/2022 0922   MCH 26.1 (L) 06/18/2022 0922   MCH 26.1 09/05/2020 0600   MCHC 31.1 (L) 06/18/2022 0922   MCHC 31.8 09/05/2020 0600   RDW 13.2 06/18/2022 0922   LYMPHSABS 2.2 01/17/2016 1817   MONOABS 0.5 01/17/2016 1817   EOSABS 0.1 01/17/2016 1817   BASOSABS 0.0 01/17/2016 1817    ASSESSMENT AND PLAN:  There are no diagnoses linked to this encounter.   Patient was given the opportunity to ask questions.  Patient verbalized understanding of the plan and was able to repeat key elements of the plan. Patient was given clear instructions to go to Emergency Department or return to medical center if symptoms don't improve, worsen, or new problems develop.The patient verbalized understanding.   No orders of the defined types were placed in this encounter.    Requested Prescriptions    No prescriptions requested or ordered in this encounter    No follow-ups on file.  Camillia Herter, NP

## 2022-09-25 NOTE — Progress Notes (Unsigned)
Virtual Visit via Telephone Note  I connected with Jillian Bishop, on 09/28/2022 at 1:55 PM by telephone and verified that I am speaking with the correct person using two identifiers.  Consent: I discussed the limitations, risks, security and privacy concerns of performing an evaluation and management service by telephone and the availability of in person appointments. I also discussed with the patient that there may be a patient responsible charge related to this service. The patient expressed understanding and agreed to proceed.   Location of Patient: Home  Location of Provider: [facility] participating in Telemedicine visit: Shon Hale, NP  History of Present Illness: Jillian Bishop is a 26 year-old female who presents for paperwork completion.   09/18/2022 [facility] per DO note: Chief Complaint  Patient presents with   Chest Pain  26 year old female presents to ED for evaluation of chest discomfort x2 weeks. Patient does report mildly worsening symptoms with inspiration and movement. She denies any falls or injuries. Does report that she does perform lifting for her past however denies it being particularly heavy. Patient denies any nausea or vomiting. She is on a birth control patch. Denies recent travel, flights, or lower calf swelling. Denies concerns for pregnancy. Denies fever, chills, cough, or congestion. Patient does endorse intermittent palpitations but denies any in the last 24 hours.   Medical Decision Making 26 year old female presents to ED for evaluation of chest discomfort x2 weeks. Reports it is intermittent. Has occasionally noticed palpitations. Denies prior pulmonary or cardiac history. Discussed with patient her differential diagnosis is quite broad and close concerns for PE versus ACS versus pneumonia versus pneumothorax versus rib dysfunction versus electrolyte abnormalities.  Patient's labs were obtained. D-dimer is less than 270. Patient has no leukocytosis. Electrolytes appear stable. Magnesium level is mildly low at 1.7. Given 2 g of IV repletion. BNP is not elevated at this time. Chest x-ray per my interpretation shows no evidence of pneumonia. EKG is nonischemic. At this time, discussed with patient that I do not believe the source of her symptoms is cardiac. Patient was given a dose of Toradol. She is advised if she still continues to have palpitation she should follow-up with cardiology and is given a referral for Zio patch placement. She is advised that her hear score is low. It is advised to return to the ED for any new or worsening symptoms.  Today's visit 09/28/2022: Chest pain and left arm/chest numbness persisting daily. Episodes last 1 to 5  minutes. She is wearing Zio patch for monitoring received from [place]. Reports she is to wear Zio patch x 14 days. The same was placed on 09/23/2022. After completion of Zio patch she is to send back by mail. States the nurse told her during placement of the Zio patch that follow-up from the doctor regarding results once mailed back is indefinite. She has not established with a cardiologist. Today requesting completion of paperwork to cover time away from work on 09/18/2022 and 09/21/2022. 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 She returned to work at full duty/no restrictions on 09/22/2022 and declines needing additional time away from work.   Past Medical History:  Diagnosis Date   Asthma    mild well controlled, no inhaler use since 2016   Benign teratoma of ovary, right    COVID-19 11/02/2019  sore throat cough, sob loss of taste and smell, cough until 03-11-2020 all symptoms resolved now   Dermoid cyst    GERD (gastroesophageal reflux disease)    Headache    tension, occ migraine   HPV (human papilloma virus) infection     No Known Allergies  Current Outpatient Medications on File Prior to Visit  Medication Sig Dispense Refill   ibuprofen (ADVIL) 400 MG tablet Take 400 mg by mouth 3 (three) times daily as needed.     ibuprofen (ADVIL) 600 MG tablet      ondansetron (ZOFRAN) 4 MG tablet Take 1 tablet (4 mg total) by mouth every 8 (eight) hours as needed for nausea or vomiting. 20 tablet 0   Semaglutide-Weight Management 1.7 MG/0.75ML SOAJ Inject 1.7 mg into the skin once a week. 3 mL 2   SUMAtriptan (IMITREX) 50 MG tablet May repeat in 2 hours if headache persists or recurs. 12 tablet 11   topiramate (TOPAMAX) 100 MG tablet Take 1 tablet (100 mg total) by mouth 2 (two) times daily. 60 tablet 11   ZAFEMY 150-35 MCG/24HR transdermal patch Place onto the skin.     No current facility-administered medications on file prior to visit.    Observations/Objective: Alert and oriented x 3. Not in acute distress. Physical examination not completed as this is a telemedicine visit.  Assessment and Plan: 1. Encounter for completion of form with patient 2. Chest pain, unspecified type - Certification of Health Care Provider for Employee Serious Health Condition completed today in office.  - Due to patient's report of indefinite follow-up plan from [facility] provider referral to Cardiology for further evaluation and management.  - Ambulatory referral to Cardiology   Follow Up Instructions: Referral to Cardiology.    Patient was given clear instructions to go to Emergency Department or return to medical center if symptoms don't improve, worsen, or new problems develop.The patient verbalized understanding.  I discussed the assessment and treatment plan with the patient. The patient was provided an opportunity to ask questions and all were answered. The patient agreed with the plan and demonstrated an understanding of the instructions.   The patient was advised to call back or seek an in-person  evaluation if the symptoms worsen or if the condition fails to improve as anticipated.     I provided 10 minutes total of non-face-to-face time during this encounter.   Levorn Oleski Zachery Dauer, NP  [facility] [place], [place] 09/28/2022, 1:55 PM

## 2022-09-28 ENCOUNTER — Encounter: Payer: Self-pay | Admitting: Family

## 2022-09-28 ENCOUNTER — Ambulatory Visit (INDEPENDENT_AMBULATORY_CARE_PROVIDER_SITE_OTHER): Payer: 59 | Admitting: Family

## 2022-09-28 DIAGNOSIS — R079 Chest pain, unspecified: Secondary | ICD-10-CM | POA: Diagnosis not present

## 2022-09-28 DIAGNOSIS — Z0289 Encounter for other administrative examinations: Secondary | ICD-10-CM | POA: Diagnosis not present

## 2022-09-29 ENCOUNTER — Other Ambulatory Visit: Payer: Self-pay | Admitting: Family

## 2022-09-29 DIAGNOSIS — Z83719 Family history of colon polyps, unspecified: Secondary | ICD-10-CM

## 2022-09-29 DIAGNOSIS — Z800 Family history of malignant neoplasm of digestive organs: Secondary | ICD-10-CM

## 2022-09-29 NOTE — Telephone Encounter (Signed)
Order complete. 

## 2022-09-30 ENCOUNTER — Ambulatory Visit: Payer: BC Managed Care – PPO | Admitting: Family

## 2022-10-06 NOTE — Progress Notes (Deleted)
Cardiology Office Note:    Date:  10/06/2022   ID:  Jillian Bishop, DOB 1996/03/19, MRN [id number]  PCP:  Camillia Herter, NP  Cardiologist:  None  Electrophysiologist:  None   Referring MD: Camillia Herter, NP   No chief complaint on file. ***  History of Present Illness:    Jillian Bishop is a 26 y.o. female with a hx of asthma who is referred by Durene Fruits, NP for evaluation of chest pain.  Past Medical History:  Diagnosis Date   Asthma    mild well controlled, no inhaler use since 2016   Benign teratoma of ovary, right    COVID-19 11/02/2019   sore throat cough, sob loss of taste and smell, cough until 03-11-2020 all symptoms resolved now   Dermoid cyst    GERD (gastroesophageal reflux disease)    Headache    tension, occ migraine   HPV (human papilloma virus) infection     Past Surgical History:  Procedure Laterality Date   APPENDECTOMY  age 1 or [place] N/A 11/19/2018   Procedure: DILATATION AND EVACUATION;  Surgeon: Paula Compton, MD;  Location: [facility];  Service: Gynecology;  Laterality: N/A;   LAPAROSCOPIC OVARIAN CYSTECTOMY Right 09/05/2020   Procedure: LAPAROSCOPIC OVARIAN CYSTECTOMY LYSIS OF ADHESISONS;  Surgeon: Sherlyn Hay, DO;  Location: [facility];  Service: Gynecology;  Laterality: Right;    Current Medications: No outpatient medications have been marked as taking for the 10/07/22 encounter (Appointment) with Donato Heinz, MD.     Allergies:   Patient has no known allergies.   Social History   Socioeconomic History   Marital status: Single    Spouse name: Not on file   Number of children: Not on file   Years of education: Not on file   Highest education level: Not on file  Occupational History   Not on file  Tobacco Use   Smoking status: Never    Passive exposure: Never   Smokeless tobacco: Never  Vaping Use   Vaping Use: Never used  Substance and Sexual Activity   Alcohol use:  No   Drug use: No   Sexual activity: Not on file  Other Topics Concern   Not on file  Social History Narrative   Not on file   Social Determinants of Health   Financial Resource Strain: Not on file  Food Insecurity: Not on file  Transportation Needs: Not on file  Physical Activity: Not on file  Stress: Not on file  Social Connections: Not on file     Family History: The patient's ***family history includes Diabetes in her father, maternal aunt, maternal grandmother, mother, and sister; Heart disease in her paternal grandmother; Hypertension in her father, maternal grandmother, mother, paternal grandmother, and sister.  ROS:   Please see the history of present illness.    *** All other systems reviewed and are negative.  EKGs/Labs/Other Studies Reviewed:    The following studies were reviewed today: ***  EKG:  EKG is *** ordered today.  The ekg ordered today demonstrates ***  Recent Labs: 06/18/2022: ALT 10; BUN 10; Creatinine, Ser 0.56; Hemoglobin 11.3; Platelets 338; Potassium CANCELED; Sodium 140; TSH 1.790  Recent Lipid Panel    Component Value Date/Time   CHOL 154 05/06/2021 1614   TRIG 56 05/06/2021 1614   HDL 41 05/06/2021 1614   CHOLHDL 3.8 05/06/2021 1614   LDLCALC 101 (H) 05/06/2021 1614    Physical  Exam:    VS:  There were no vitals taken for this visit.    Wt Readings from Last 3 Encounters:  08/04/22 170 lb (77.1 kg)  06/18/22 154 lb (69.9 kg)  03/19/22 164 lb (74.4 kg)     GEN: *** Well nourished, well developed in no acute distress HEENT: Normal NECK: No JVD; No carotid bruits LYMPHATICS: No lymphadenopathy CARDIAC: ***RRR, no murmurs, rubs, gallops RESPIRATORY:  Clear to auscultation without rales, wheezing or rhonchi  ABDOMEN: Soft, non-tender, non-distended MUSCULOSKELETAL:  No edema; No deformity  SKIN: Warm and dry NEUROLOGIC:  Alert and oriented x 3 PSYCHIATRIC:  Normal affect   ASSESSMENT:    No diagnosis found. PLAN:    Chest  pain:  RTC in***   Medication Adjustments/Labs and Tests Ordered: Current medicines are reviewed at length with the patient today.  Concerns regarding medicines are outlined above.  No orders of the defined types were placed in this encounter.  No orders of the defined types were placed in this encounter.   There are no Patient Instructions on file for this visit.   Signed, Donato Heinz, MD  10/06/2022 2:46 PM    [place]

## 2022-10-07 ENCOUNTER — Ambulatory Visit: Payer: 59 | Attending: Cardiology | Admitting: Cardiology

## 2022-10-20 ENCOUNTER — Encounter: Payer: Self-pay | Admitting: Family

## 2022-10-27 ENCOUNTER — Ambulatory Visit: Payer: 59 | Attending: Cardiology | Admitting: Internal Medicine

## 2022-11-03 ENCOUNTER — Telehealth: Payer: Self-pay | Admitting: Family

## 2022-11-03 NOTE — Telephone Encounter (Signed)
Appointment Request From: Jillian Bishop    With Provider: Camillia Herter, NP [facility] [place]    Preferred Date Range: 10/30/2022 - 11/02/2022    Preferred Times: Any Time    Reason for visit: Office Visit    Comments:  Weight loss and a physical    Called pt to schedule appt for physical, no answer, LVM to call back to schedule this asap.

## 2022-11-06 ENCOUNTER — Encounter: Payer: Self-pay | Admitting: Gastroenterology

## 2022-12-14 ENCOUNTER — Ambulatory Visit: Payer: 59 | Admitting: Gastroenterology

## 2023-03-16 NOTE — Progress Notes (Signed)
Erroneous encounter-disregard

## 2023-03-24 ENCOUNTER — Encounter: Payer: 59 | Admitting: Family

## 2023-03-24 DIAGNOSIS — Z131 Encounter for screening for diabetes mellitus: Secondary | ICD-10-CM

## 2023-03-24 DIAGNOSIS — Z13220 Encounter for screening for lipoid disorders: Secondary | ICD-10-CM

## 2023-03-24 DIAGNOSIS — Z124 Encounter for screening for malignant neoplasm of cervix: Secondary | ICD-10-CM

## 2023-03-24 DIAGNOSIS — Z1329 Encounter for screening for other suspected endocrine disorder: Secondary | ICD-10-CM

## 2023-03-24 DIAGNOSIS — Z13228 Encounter for screening for other metabolic disorders: Secondary | ICD-10-CM

## 2023-03-24 DIAGNOSIS — Z113 Encounter for screening for infections with a predominantly sexual mode of transmission: Secondary | ICD-10-CM

## 2023-03-24 DIAGNOSIS — Z130 Encounter for screening for diseases of the blood and blood-forming organs and certain disorders involving the immune mechanism: Secondary | ICD-10-CM

## 2023-03-24 DIAGNOSIS — Z0000 Encounter for general adult medical examination without abnormal findings: Secondary | ICD-10-CM

## 2023-04-14 NOTE — Progress Notes (Unsigned)
Patient ID: Jillian Bishop, female    DOB: 1996-06-30  MRN: [id number]  CC: Annual Physical Exam  Subjective: Jillian Bishop is a 27 y.o. female who presents for annual physical exam.   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     ibuprofen (ADVIL) 600 MG tablet      ondansetron (ZOFRAN) 4 MG tablet Take 1 tablet (4 mg total) by mouth every 8 (eight) hours as needed for nausea or vomiting. 20 tablet 0   Semaglutide-Weight Management 1.7 MG/0.75ML SOAJ Inject 1.7 mg into the skin once a week. 3 mL 2   SUMAtriptan (IMITREX) 50 MG tablet May repeat in 2 hours if headache persists or recurs. 12 tablet 11   topiramate (TOPAMAX) 100 MG tablet Take 1 tablet (100 mg total) by mouth 2 (two) times daily. 60 tablet 11   ZAFEMY 150-35 MCG/24HR transdermal patch Place onto the skin.     No current facility-administered medications on file prior to visit.    No Known Allergies  Social History   Socioeconomic History   Marital status: Single    Spouse name: Not on file   Number of children: Not on file   Years of education: Not on file   Highest education level: Not on file  Occupational History   Not on file  Tobacco Use   Smoking status: Never    Passive exposure: Never   Smokeless tobacco: Never  Vaping Use   Vaping Use: Never used  Substance and Sexual Activity   Alcohol use: No   Drug use: No   Sexual activity: Not on file   Other Topics Concern   Not on file  Social History Narrative   Not on file   Social Determinants of Health   Financial Resource Strain: Not on file  Food Insecurity: Not on file  Transportation Needs: Not on file  Physical Activity: Not on file  Stress: Not on file  Social Connections: Not on file  Intimate Partner Violence: Not on file    Family History  Problem Relation Age of Onset   Hypertension Mother    Diabetes Mother    Hypertension Father    Diabetes Father    Hypertension Sister    Heart disease Paternal Grandmother    Hypertension Paternal Grandmother    Diabetes Maternal Grandmother    Hypertension Maternal Grandmother    Diabetes Maternal Aunt    Diabetes Sister     Past Surgical History:  Procedure Laterality Date   APPENDECTOMY  age 37 or 3   DILATION AND EVACUATION N/A 11/19/2018   Procedure: DILATATION AND EVACUATION;  Surgeon: Huel Cote, MD;  Location: [facility];  Service: Gynecology;  Laterality: N/A;   LAPAROSCOPIC OVARIAN CYSTECTOMY Right 09/05/2020   Procedure: LAPAROSCOPIC OVARIAN CYSTECTOMY LYSIS OF ADHESISONS;  Surgeon: Edwinna Areola, DO;  Location: [facility] ;  Service: Gynecology;  Laterality: Right;  ROS: Review of Systems Negative except as stated above  PHYSICAL EXAM: There were no vitals taken for this visit.  Physical Exam  {female adult master:310786} {female adult master:310785}     Latest Ref Rng & Units 06/18/2022    9:22 AM 05/06/2021    4:14 PM 11/13/2015   11:18 AM  CMP  Glucose mg/dL CANCELED  65  80   BUN 6 - 20 mg/dL 10  9    Creatinine 4.09 - 1.00 mg/dL 8.11  9.14    Sodium 782 - 144 mmol/L 140  138    Potassium mmol/L CANCELED  4.1    Chloride 96 - 106 mmol/L 104  102    CO2 20 - 29 mmol/L 19  22    Calcium 8.7 - 10.2 mg/dL 8.4  9.3    Total Protein 6.0 - 8.5 g/dL 6.6  7.3    Total Bilirubin 0.0 - 1.2 mg/dL 0.2  <9.5    Alkaline Phos 44 - 121 IU/L 40  89    AST 0 - 40 IU/L 10  15     ALT 0 - 32 IU/L 10  19     Lipid Panel     Component Value Date/Time   CHOL 154 05/06/2021 1614   TRIG 56 05/06/2021 1614   HDL 41 05/06/2021 1614   CHOLHDL 3.8 05/06/2021 1614   LDLCALC 101 (H) 05/06/2021 1614    CBC    Component Value Date/Time   WBC 5.5 06/18/2022 0922   WBC 9.6 09/05/2020 0600   RBC 4.33 06/18/2022 0922   RBC 4.44 09/05/2020 0600   HGB 11.3 06/18/2022 0922   HGB 11.6 05/30/2015 0000   HCT 36.3 06/18/2022 0922   HCT 37 05/30/2015 0000   PLT 338 06/18/2022 0922   PLT 331 05/30/2015 0000   MCV 84 06/18/2022 0922   MCH 26.1 (L) 06/18/2022 0922   MCH 26.1 09/05/2020 0600   MCHC 31.1 (L) 06/18/2022 0922   MCHC 31.8 09/05/2020 0600   RDW 13.2 06/18/2022 0922   LYMPHSABS 2.2 01/17/2016 1817   MONOABS 0.5 01/17/2016 1817   EOSABS 0.1 01/17/2016 1817   BASOSABS 0.0 01/17/2016 1817    ASSESSMENT AND PLAN:  There are no diagnoses linked to this encounter.   Patient was given the opportunity to ask questions.  Patient verbalized understanding of the plan and was able to repeat key elements of the plan. Patient was given clear instructions to go to Emergency Department or return to medical center if symptoms don't improve, worsen, or new problems develop.The patient verbalized understanding.   No orders of the defined types were placed in this encounter.    Requested Prescriptions    No prescriptions requested or ordered in this encounter    No follow-ups on file.  Rema Fendt, NP

## 2023-04-15 ENCOUNTER — Encounter: Payer: Self-pay | Admitting: Family

## 2023-04-15 ENCOUNTER — Ambulatory Visit (INDEPENDENT_AMBULATORY_CARE_PROVIDER_SITE_OTHER): Payer: BC Managed Care – PPO | Admitting: Family

## 2023-04-15 VITALS — BP 111/71 | HR 69 | Temp 98.0°F | Resp 12 | Ht 62.0 in | Wt 177.2 lb

## 2023-04-15 DIAGNOSIS — Z1329 Encounter for screening for other suspected endocrine disorder: Secondary | ICD-10-CM | POA: Diagnosis not present

## 2023-04-15 DIAGNOSIS — Z0000 Encounter for general adult medical examination without abnormal findings: Secondary | ICD-10-CM | POA: Diagnosis not present

## 2023-04-15 DIAGNOSIS — Z130 Encounter for screening for diseases of the blood and blood-forming organs and certain disorders involving the immune mechanism: Secondary | ICD-10-CM | POA: Diagnosis not present

## 2023-04-15 DIAGNOSIS — Z6832 Body mass index (BMI) 32.0-32.9, adult: Secondary | ICD-10-CM | POA: Diagnosis not present

## 2023-04-15 DIAGNOSIS — Z13220 Encounter for screening for lipoid disorders: Secondary | ICD-10-CM

## 2023-04-15 DIAGNOSIS — Z13228 Encounter for screening for other metabolic disorders: Secondary | ICD-10-CM

## 2023-04-15 DIAGNOSIS — Z131 Encounter for screening for diabetes mellitus: Secondary | ICD-10-CM

## 2023-04-15 DIAGNOSIS — Z124 Encounter for screening for malignant neoplasm of cervix: Secondary | ICD-10-CM

## 2023-04-15 DIAGNOSIS — R635 Abnormal weight gain: Secondary | ICD-10-CM

## 2023-04-15 DIAGNOSIS — Z7689 Persons encountering health services in other specified circumstances: Secondary | ICD-10-CM

## 2023-04-15 DIAGNOSIS — Z113 Encounter for screening for infections with a predominantly sexual mode of transmission: Secondary | ICD-10-CM

## 2023-04-15 NOTE — Patient Instructions (Signed)

## 2023-04-15 NOTE — Progress Notes (Signed)
Pt is here for physical   Wants to discuss weight loss options   Requesting A1C check with bloodwork  Pt states she has not a had a period since 2/24 due to having a miscarriage

## 2023-04-16 ENCOUNTER — Encounter: Payer: Self-pay | Admitting: Family

## 2023-04-17 LAB — CMP14+EGFR
ALT: 13 IU/L (ref 0–32)
AST: 12 IU/L (ref 0–40)
Albumin/Globulin Ratio: 1.6 (ref 1.2–2.2)
Albumin: 4.1 g/dL (ref 4.0–5.0)
Alkaline Phosphatase: 43 IU/L — ABNORMAL LOW (ref 44–121)
BUN/Creatinine Ratio: 15 (ref 9–23)
BUN: 9 mg/dL (ref 6–20)
Bilirubin Total: <0.2 mg/dL (ref 0.0–1.2)
CO2: 19 mmol/L — ABNORMAL LOW (ref 20–29)
Calcium: 9.3 mg/dL (ref 8.7–10.2)
Chloride: 104 mmol/L (ref 96–106)
Creatinine, Ser: 0.6 mg/dL (ref 0.57–1.00)
Globulin, Total: 2.5 g/dL (ref 1.5–4.5)
Glucose: 48 mg/dL — ABNORMAL LOW (ref 70–99)
Potassium: 4.7 mmol/L (ref 3.5–5.2)
Sodium: 141 mmol/L (ref 134–144)
Total Protein: 6.6 g/dL (ref 6.0–8.5)
eGFR: 125 mL/min/{1.73_m2} (ref >59)

## 2023-04-17 LAB — CBC
Hematocrit: 36.3 % (ref 34.0–46.6)
Hemoglobin: 11.5 g/dL (ref 11.1–15.9)
MCH: 27.5 pg (ref 26.6–33.0)
MCHC: 31.7 g/dL (ref 31.5–35.7)
MCV: 87 fL (ref 79–97)
Platelets: 389 10*3/uL (ref 150–450)
RBC: 4.18 x10E6/uL (ref 3.77–5.28)
RDW: 13.2 % (ref 11.7–15.4)
WBC: 6.5 10*3/uL (ref 3.4–10.8)

## 2023-04-17 LAB — LIPID PANEL
Chol/HDL Ratio: 2.7 ratio (ref 0.0–4.4)
Cholesterol, Total: 169 mg/dL (ref 100–199)
HDL: 62 mg/dL (ref >39)
LDL Chol Calc (NIH): 100 mg/dL — ABNORMAL HIGH (ref 0–99)
Triglycerides: 33 mg/dL (ref 0–149)
VLDL Cholesterol Cal: 7 mg/dL (ref 5–40)

## 2023-04-17 LAB — TSH: TSH: 0.793 u[IU]/mL (ref 0.450–4.500)

## 2023-04-17 LAB — HEMOGLOBIN A1C
Est. average glucose Bld gHb Est-mCnc: 105 mg/dL
Hgb A1c MFr Bld: 5.3 % (ref 4.8–5.6)

## 2023-05-06 ENCOUNTER — Encounter: Payer: Self-pay | Admitting: Family

## 2023-05-10 NOTE — Telephone Encounter (Signed)
Pt has appt scheduled 06/04

## 2023-05-10 NOTE — Telephone Encounter (Signed)
Report to Urgent Care/Emergency Department for immediate medical evaluation. Also, schedule follow-up appointment with Primary Care.

## 2023-05-10 NOTE — Progress Notes (Unsigned)
Erroneous encounter-disregard

## 2023-05-10 NOTE — Telephone Encounter (Signed)
I have attempted without success to contact this patient by phone to return their call and I left a message on answering machine.

## 2023-05-11 ENCOUNTER — Encounter: Payer: BC Managed Care – PPO | Admitting: Family

## 2023-05-11 DIAGNOSIS — Z712 Person consulting for explanation of examination or test findings: Secondary | ICD-10-CM

## 2023-06-01 DIAGNOSIS — Z0289 Encounter for other administrative examinations: Secondary | ICD-10-CM

## 2023-06-02 ENCOUNTER — Ambulatory Visit (INDEPENDENT_AMBULATORY_CARE_PROVIDER_SITE_OTHER): Payer: BC Managed Care – PPO | Admitting: Family Medicine

## 2023-06-02 ENCOUNTER — Encounter (INDEPENDENT_AMBULATORY_CARE_PROVIDER_SITE_OTHER): Payer: Self-pay | Admitting: Family Medicine

## 2023-06-02 VITALS — BP 110/71 | HR 92 | Temp 98.3°F | Ht 63.0 in | Wt 182.0 lb

## 2023-06-02 DIAGNOSIS — R7303 Prediabetes: Secondary | ICD-10-CM | POA: Diagnosis not present

## 2023-06-02 DIAGNOSIS — E6609 Other obesity due to excess calories: Secondary | ICD-10-CM | POA: Diagnosis not present

## 2023-06-02 DIAGNOSIS — Z6834 Body mass index (BMI) 34.0-34.9, adult: Secondary | ICD-10-CM | POA: Insufficient documentation

## 2023-06-02 DIAGNOSIS — Z6832 Body mass index (BMI) 32.0-32.9, adult: Secondary | ICD-10-CM | POA: Diagnosis not present

## 2023-06-02 DIAGNOSIS — E559 Vitamin D deficiency, unspecified: Secondary | ICD-10-CM | POA: Diagnosis not present

## 2023-06-02 NOTE — Assessment & Plan Note (Signed)
Lab Results  Component Value Date   HGBA1C 5.3 04/15/2023   Reports a hx of prediabetes following pregnancy in 2021 with weight gain Did well on Ozempic and then [facility] without problems Working on reducing intake of sweets.  Has added in regular walking.  Check A1c and fasting insulin with labs

## 2023-06-02 NOTE — Assessment & Plan Note (Addendum)
Last vitamin D Lab Results  Component Value Date   VD25OH 16.8 (L) 06/18/2022   Currently not on a vitamin D supplement C/o fatigue  Recheck vitamin D level next visit

## 2023-06-02 NOTE — Assessment & Plan Note (Signed)
We reviewed weight, biometrics, associated medical conditions and contributing factors with patient. She would benefit from weight loss therapy via a modified calorie, low-carb, high-protein nutritional plan tailored to their REE (resting energy expenditure) which will be determined by indirect calorimetry.  We will also assess for cardiometabolic risk and nutritional derangements via fasting serologies at her next appointment. 

## 2023-06-02 NOTE — Progress Notes (Signed)
Office: [phone]  /  Fax: [phone]   Initial Visit  Jillian Bishop was seen in clinic today to evaluate for obesity. She is interested in losing weight to improve overall health and reduce the risk of weight related complications. She presents today to review program treatment options, initial physical assessment, and evaluation.     She was referred by: Friend or Family  When asked what else they would like to accomplish? 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  Lost 50 lb on Ozempic for prediabetes and then tried [employer], got down to 154 lb and changed job and insurance no longer covers [employer].  Has 4 kids 3-->12.  Mom, their dad and her sister helps out.  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    When asked how has your weight affected you? She states: Contributed to orthopedic problems or mobility issues and Having fatigue  Some associated conditions: None  Contributing factors: Nutritional and Stress  Weight promoting medications identified: None  Current nutrition plan: None  Current level of physical activity: Walking  Current or previous pharmacotherapy: GLP-1 and Phentermine  Response to medication: Had side effects so it was discontinued Phentermine caused fast HR  Past medical history includes:   Past Medical History:  Diagnosis Date   Asthma    mild well controlled, no inhaler use since 2016   Benign teratoma of ovary, right    COVID-19 11/02/2019   sore throat cough, sob loss of taste and smell, cough until 03-11-2020 all symptoms resolved now   Dermoid cyst    GERD (gastroesophageal reflux disease)    Headache    tension, occ migraine   HPV (human papilloma virus) infection      Objective:   BP 110/71   Pulse 92   Temp 98.3 F (36.8 C)   Ht 5\' 3"  (1.6 m)   Wt 182 lb (82.6 kg)   SpO2 100%   BMI 32.24 kg/m  She was weighed on the bioimpedance scale: Body  mass index is 32.24 kg/m.  Peak Weight:232 , Body Fat%:37.1, Visceral Fat Rating:6, Weight trend over the last 12 months: Increasing  General:  Alert, oriented and cooperative. Patient is in no acute distress.  Respiratory: Normal respiratory effort, no problems with respiration noted   Gait: able to ambulate independently  Mental Status: Normal mood and affect. Normal behavior. Normal judgment and thought content.   DIAGNOSTIC DATA REVIEWED:  BMET    Component Value Date/Time   NA 141 04/15/2023 2011   K 4.7 04/15/2023 2011   CL 104 04/15/2023 2011   CO2 19 (L) 04/15/2023 2011   GLUCOSE 48 (L) 04/15/2023 2011   GLUCOSE 80 11/13/2015 1118   BUN 9 04/15/2023 2011   CREATININE 0.60 04/15/2023 2011   CALCIUM 9.3 04/15/2023 2011   Lab Results  Component Value Date   HGBA1C 5.3 04/15/2023   HGBA1C 5.9 (A) 05/06/2021   No results found for: "INSULIN" CBC    Component Value Date/Time   WBC 6.5 04/15/2023 2011   WBC 9.6 09/05/2020 0600   RBC 4.18 04/15/2023 2011   RBC 4.44 09/05/2020 0600   HGB 11.5 04/15/2023 2011   HGB 11.6 05/30/2015 0000   HCT 36.3 04/15/2023 2011   HCT 37 05/30/2015 0000   PLT 389 04/15/2023 2011   PLT 331 05/30/2015 0000   MCV 87 04/15/2023 2011   MCH 27.5 04/15/2023 2011  MCH 26.1 09/05/2020 0600   MCHC 31.7 04/15/2023 2011   MCHC 31.8 09/05/2020 0600   RDW 13.2 04/15/2023 2011   Iron/TIBC/Ferritin/ %Sat No results found for: "IRON", "TIBC", "FERRITIN", "IRONPCTSAT" Lipid Panel     Component Value Date/Time   CHOL 169 04/15/2023 2011   TRIG 33 04/15/2023 2011   HDL 62 04/15/2023 2011   CHOLHDL 2.7 04/15/2023 2011   LDLCALC 100 (H) 04/15/2023 2011   Hepatic Function Panel     Component Value Date/Time   PROT 6.6 04/15/2023 2011   ALBUMIN 4.1 04/15/2023 2011   AST 12 04/15/2023 2011   ALT 13 04/15/2023 2011   ALKPHOS 43 (L) 04/15/2023 2011   BILITOT <0.2 04/15/2023 2011      Component Value Date/Time   TSH 0.793 04/15/2023 2011      Assessment and Plan:   Vitamin D deficiency Assessment & Plan: Last vitamin D Lab Results  Component Value Date   VD25OH 16.8 (L) 06/18/2022   Currently not on a vitamin D supplement C/o fatigue  Recheck vitamin D level next visit   Class 1 obesity due to excess calories without serious comorbidity with body mass index (BMI) of 32.0 to 32.9 in adult Assessment & Plan: We reviewed weight, biometrics, associated medical conditions and contributing factors with patient. She would benefit from weight loss therapy via a modified calorie, low-carb, high-protein nutritional plan tailored to their REE (resting energy expenditure) which will be determined by indirect calorimetry.  We will also assess for cardiometabolic risk and nutritional derangements via fasting serologies at her next appointment.     Prediabetes Assessment & Plan: Lab Results  Component Value Date   HGBA1C 5.3 04/15/2023   Reports a hx of prediabetes following pregnancy in 2021 with weight gain Did well on Ozempic and then [facility] without problems Working on reducing intake of sweets.  Has added in regular walking.  Check A1c and fasting insulin with labs         Obesity Treatment / Action Plan:  Patient will work on garnering support from family and friends to begin weight loss journey. Will work on eliminating or reducing the presence of highly palatable, calorie dense foods in the home. Will complete provided nutritional and psychosocial assessment questionnaire before the next appointment. Will be scheduled for indirect calorimetry to determine resting energy expenditure in a fasting state.  This will allow [place] to create a reduced calorie, high-protein meal plan to promote loss of fat mass while preserving muscle mass. Will reduce the frequency of eating out and making healthier choices by advanced menu planning. Counseled on the health benefits of losing 5%-15% of total body weight. Was counseled  on nutritional approaches to weight loss and benefits of reducing processed foods and consuming plant-based foods and high quality protein as part of nutritional weight management. Was counseled on pharmacotherapy and role as an adjunct in weight management.   Obesity Education Performed Today:  She was weighed on the bioimpedance scale and results were discussed and documented in the synopsis.  We discussed obesity as a disease and the importance of a more detailed evaluation of all the factors contributing to the disease.  We discussed the importance of long term lifestyle changes which include nutrition, exercise and behavioral modifications as well as the importance of customizing this to her specific health and social needs.  We discussed the benefits of reaching a healthier weight to alleviate the symptoms of existing conditions and reduce the risks of the biomechanical, metabolic  and psychological effects of obesity.  Jillian Bishop appears to be in the action stage of change and states they are ready to start intensive lifestyle modifications and behavioral modifications.  30 minutes was spent today on this visit including the above counseling, pre-visit chart review, and post-visit documentation.  Reviewed by clinician on day of visit: allergies, medications, problem list, medical history, surgical history, family history, social history, and previous encounter notes pertinent to obesity diagnosis.    Seymour Bars, D.O. DABFM, DABOM [facility] [phone] [place], [place] [address] [phone]

## 2023-06-04 ENCOUNTER — Other Ambulatory Visit (HOSPITAL_COMMUNITY)
Admission: RE | Admit: 2023-06-04 | Discharge: 2023-06-04 | Disposition: A | Discharge status: Home / Self Care | Payer: BC Managed Care – PPO | Source: Ambulatory Visit | Attending: Obstetrics & Gynecology | Admitting: Obstetrics & Gynecology

## 2023-06-04 ENCOUNTER — Encounter: Payer: Self-pay | Admitting: Obstetrics & Gynecology

## 2023-06-04 ENCOUNTER — Ambulatory Visit (INDEPENDENT_AMBULATORY_CARE_PROVIDER_SITE_OTHER): Payer: BC Managed Care – PPO | Admitting: Obstetrics & Gynecology

## 2023-06-04 VITALS — BP 112/76 | HR 76 | Ht 63.0 in | Wt 185.0 lb

## 2023-06-04 DIAGNOSIS — Z113 Encounter for screening for infections with a predominantly sexual mode of transmission: Secondary | ICD-10-CM

## 2023-06-04 DIAGNOSIS — N898 Other specified noninflammatory disorders of vagina: Secondary | ICD-10-CM

## 2023-06-04 DIAGNOSIS — Z01419 Encounter for gynecological examination (general) (routine) without abnormal findings: Secondary | ICD-10-CM | POA: Insufficient documentation

## 2023-06-04 DIAGNOSIS — Z8759 Personal history of other complications of pregnancy, childbirth and the puerperium: Secondary | ICD-10-CM | POA: Diagnosis not present

## 2023-06-04 MED ORDER — NORGESTIMATE-ETH ESTRADIOL 0.25-35 MG-MCG PO TABS
1.0000 | ORAL_TABLET | Freq: Every day | ORAL | 11 refills | Status: DC
Start: 2023-06-04 — End: 2023-08-24

## 2023-06-04 NOTE — Progress Notes (Signed)
Patient ID: Jillian Bishop, female   DOB: [birth year], 27 y.o.   MRN: [id number]  Chief Complaint  Patient presents with   Gynecologic Exam    HPI Jillian Bishop is a 27 y.o. female.  [id number] S/p SAb at home late 03/2023 and she still has DUB. She was not seen for care after the miscarriage. She does not want to conceive and requests OCP. HPI  Past Medical History:  Diagnosis Date   Asthma    mild well controlled, no inhaler use since 2016   Benign teratoma of ovary, right    COVID-19 11/02/2019   sore throat cough, sob loss of taste and smell, cough until 03-11-2020 all symptoms resolved now   Dermoid cyst    GERD (gastroesophageal reflux disease)    Headache    tension, occ migraine   HPV (human papilloma virus) infection     Past Surgical History:  Procedure Laterality Date   APPENDECTOMY  age 40 or 3   DILATION AND EVACUATION N/A 11/19/2018   Procedure: DILATATION AND EVACUATION;  Surgeon: Huel Cote, MD;  Location: [facility];  Service: Gynecology;  Laterality: N/A;   LAPAROSCOPIC OVARIAN CYSTECTOMY Right 09/05/2020   Procedure: LAPAROSCOPIC OVARIAN CYSTECTOMY LYSIS OF ADHESISONS;  Surgeon: Edwinna Areola, DO;  Location: [facility];  Service: Gynecology;  Laterality: Right;    Family History  Problem Relation Age of Onset   Hypertension Mother    Diabetes Mother    Hypertension Father    Diabetes Father    Hypertension Sister    Heart disease Paternal Grandmother    Hypertension Paternal Grandmother    Diabetes Maternal Grandmother    Hypertension Maternal Grandmother    Diabetes Maternal Aunt    Diabetes Sister     Social History Social History   Tobacco Use   Smoking status: Never    Passive exposure: Never   Smokeless tobacco: Never  Vaping Use   Vaping Use: Never used  Substance Use Topics   Alcohol use: No   Drug use: No    No Known Allergies  Current Outpatient Medications  Medication Sig Dispense Refill    norgestimate-ethinyl estradiol (ORTHO-CYCLEN) 0.25-35 MG-MCG tablet Take 1 tablet by mouth daily. 28 tablet 11   ibuprofen (ADVIL) 400 MG tablet Take 400 mg by mouth 3 (three) times daily as needed. (Patient not taking: Reported on 06/04/2023)     Semaglutide-Weight Management 1.7 MG/0.75ML SOAJ Inject 1.7 mg into the skin once a week. (Patient not taking: Reported on 06/04/2023) 3 mL 2   SUMAtriptan (IMITREX) 50 MG tablet May repeat in 2 hours if headache persists or recurs. (Patient not taking: Reported on 06/04/2023) 12 tablet 11   topiramate (TOPAMAX) 100 MG tablet Take 1 tablet (100 mg total) by mouth 2 (two) times daily. (Patient not taking: Reported on 06/04/2023) 60 tablet 11   ZAFEMY 150-35 MCG/24HR transdermal patch Place onto the skin. (Patient not taking: Reported on 06/04/2023)     No current facility-administered medications for this visit.    Review of Systems Review of Systems  Constitutional: Negative.   Respiratory: Negative.    Cardiovascular: Negative.   Genitourinary:  Positive for vaginal bleeding. Negative for pelvic pain.    Blood pressure 112/76, pulse 76, height 5\' 3"  (1.6 m), weight 185 lb (83.9 kg), not currently breastfeeding.  Physical Exam Physical Exam Vitals and nursing note reviewed. Exam conducted with a chaperone present.  Constitutional:      Appearance: Normal appearance. She  is not ill-appearing.  HENT:     Head: Normocephalic and atraumatic.  Cardiovascular:     Rate and Rhythm: Normal rate.  Pulmonary:     Effort: Pulmonary effort is normal.  Chest:  Breasts:    Right: Normal.     Left: Normal.  Abdominal:     Palpations: Abdomen is soft.  Genitourinary:    General: Normal vulva.     Exam position: Lithotomy position.     Vagina: Bleeding present.     Cervix: Normal.     Uterus: Normal.      Adnexa: Right adnexa normal and left adnexa normal.  Musculoskeletal:        General: Normal range of motion.     Cervical back: Normal range of  motion.  Skin:    General: Skin is warm and dry.  Psychiatric:        Mood and Affect: Mood normal.        Behavior: Behavior normal.    Data Reviewed RH Type Positive  ABO Grouping O    Assessment Encounter for well woman exam with routine gynecological exam - Plan: Cytology - PAP  Vaginal discharge - Plan: Cervicovaginal ancillary only  Screening for STDs (sexually transmitted diseases) - Plan: Hepatitis B surface antigen, Hepatitis C antibody, HIV Antibody (routine testing w rflx), RPR  Miscarriage within last 12 months - Plan: Beta hCG quant (ref lab), norgestimate-ethinyl estradiol (ORTHO-CYCLEN) 0.25-35 MG-MCG tablet   Plan Expect DUB following SAb will respond to OCP for cycle control and BC. F/u on HCG. RTC 1 year     Scheryl Darter 06/04/2023, 10:09 AM

## 2023-06-05 LAB — HEPATITIS B SURFACE ANTIGEN: Hepatitis B Surface Ag: NEGATIVE

## 2023-06-05 LAB — HIV ANTIBODY (ROUTINE TESTING W REFLEX): HIV Screen 4th Generation wRfx: NONREACTIVE

## 2023-06-05 LAB — RPR: RPR Ser Ql: NONREACTIVE

## 2023-06-05 LAB — BETA HCG QUANT (REF LAB): hCG Quant: <1 m[IU]/mL

## 2023-06-05 LAB — HEPATITIS C ANTIBODY: Hep C Virus Ab: NONREACTIVE

## 2023-06-08 LAB — CERVICOVAGINAL ANCILLARY ONLY
Bacterial Vaginitis (gardnerella): NEGATIVE
Candida Glabrata: NEGATIVE
Candida Vaginitis: NEGATIVE
Chlamydia: NEGATIVE
Comment: NEGATIVE
Comment: NEGATIVE
Comment: NEGATIVE
Comment: NEGATIVE
Comment: NEGATIVE
Comment: NORMAL
Neisseria Gonorrhea: NEGATIVE
Trichomonas: NEGATIVE

## 2023-06-09 LAB — CYTOLOGY - PAP
Diagnosis: NEGATIVE
Diagnosis: REACTIVE

## 2023-06-24 ENCOUNTER — Ambulatory Visit (INDEPENDENT_AMBULATORY_CARE_PROVIDER_SITE_OTHER): Payer: BC Managed Care – PPO | Admitting: Family Medicine

## 2023-07-07 ENCOUNTER — Encounter: Payer: Self-pay | Admitting: Obstetrics & Gynecology

## 2023-07-08 ENCOUNTER — Encounter: Payer: Self-pay | Admitting: Family

## 2023-07-08 ENCOUNTER — Ambulatory Visit (INDEPENDENT_AMBULATORY_CARE_PROVIDER_SITE_OTHER): Payer: BC Managed Care – PPO | Admitting: Family Medicine

## 2023-07-08 NOTE — Telephone Encounter (Signed)
Sent to Amy to see if she will send her in something to help this.

## 2023-07-08 NOTE — Telephone Encounter (Signed)
Please advise/call  patent

## 2023-07-09 ENCOUNTER — Telehealth: Payer: Self-pay | Admitting: Family

## 2023-07-09 NOTE — Telephone Encounter (Signed)
Schedule appointment. During the interim report to Urgent Care/Emergency Department for immediate medical evaluation.

## 2023-07-09 NOTE — Telephone Encounter (Signed)
Called pt to schedule appt per nurse Curley Spice for pt's concerns of anxiety and sleeplessness. Could not reach pt; left a voicemail to call office back to schedule.

## 2023-07-12 ENCOUNTER — Ambulatory Visit: Payer: BC Managed Care – PPO | Admitting: Obstetrics and Gynecology

## 2023-07-16 NOTE — Telephone Encounter (Signed)
Pt has upcoming appointment 07/20/2023

## 2023-07-20 ENCOUNTER — Encounter: Payer: BC Managed Care – PPO | Admitting: Family

## 2023-07-20 ENCOUNTER — Encounter: Payer: Self-pay | Admitting: Family

## 2023-07-20 NOTE — Progress Notes (Signed)
Erroneous encounter-disregard

## 2023-07-28 DIAGNOSIS — Z3201 Encounter for pregnancy test, result positive: Secondary | ICD-10-CM | POA: Diagnosis not present

## 2023-08-02 ENCOUNTER — Ambulatory Visit (INDEPENDENT_AMBULATORY_CARE_PROVIDER_SITE_OTHER): Payer: BC Managed Care – PPO | Admitting: Family Medicine

## 2023-08-14 ENCOUNTER — Encounter: Payer: Self-pay | Admitting: Obstetrics & Gynecology

## 2023-08-16 ENCOUNTER — Ambulatory Visit (INDEPENDENT_AMBULATORY_CARE_PROVIDER_SITE_OTHER): Payer: BC Managed Care – PPO | Admitting: Family Medicine

## 2023-08-20 ENCOUNTER — Ambulatory Visit: Payer: BC Managed Care – PPO

## 2023-08-20 DIAGNOSIS — Z3201 Encounter for pregnancy test, result positive: Secondary | ICD-10-CM | POA: Diagnosis not present

## 2023-08-23 ENCOUNTER — Inpatient Hospital Stay (HOSPITAL_COMMUNITY)
Admission: AD | Admit: 2023-08-23 | Discharge: 2023-08-24 | Disposition: A | Discharge status: Home / Self Care | Payer: BC Managed Care – PPO | Attending: Obstetrics & Gynecology | Admitting: Obstetrics & Gynecology

## 2023-08-23 DIAGNOSIS — O039 Complete or unspecified spontaneous abortion without complication: Secondary | ICD-10-CM | POA: Diagnosis not present

## 2023-08-23 DIAGNOSIS — Z3A00 Weeks of gestation of pregnancy not specified: Secondary | ICD-10-CM | POA: Diagnosis not present

## 2023-08-23 LAB — ABO/RH: ABO/RH(D): O POS

## 2023-08-23 LAB — HCG, QUANTITATIVE, PREGNANCY: hCG, Beta Chain, Quant, S: <1 m[IU]/mL (ref <5)

## 2023-08-23 NOTE — MAU Note (Signed)
Pt says she didn't know she was preg and last night at 7pm- she had baby in toilet . ( Has picture )  In triage - pad on - small amt spots.  Has pain on right side of abd and down right leg - feels numb - started 7am  10/10 Now pain is 7.5/10

## 2023-08-24 DIAGNOSIS — Z3A00 Weeks of gestation of pregnancy not specified: Secondary | ICD-10-CM | POA: Diagnosis not present

## 2023-08-24 DIAGNOSIS — O039 Complete or unspecified spontaneous abortion without complication: Secondary | ICD-10-CM

## 2023-08-24 LAB — URINALYSIS, ROUTINE W REFLEX MICROSCOPIC
Bilirubin Urine: NEGATIVE
Glucose, UA: NEGATIVE mg/dL
Ketones, ur: 20 mg/dL — AB
Leukocytes,Ua: NEGATIVE
Nitrite: NEGATIVE
Protein, ur: 30 mg/dL — AB
RBC / HPF: >50 RBC/hpf (ref 0–5)
Specific Gravity, Urine: 1.031 — ABNORMAL HIGH (ref 1.005–1.030)
pH: 5 (ref 5.0–8.0)

## 2023-08-24 MED ORDER — PREPLUS 27-1 MG PO TABS
1.0000 | ORAL_TABLET | Freq: Every day | ORAL | 13 refills | Status: AC
Start: 2023-08-24 — End: ?

## 2023-08-24 MED ORDER — NAPROXEN 500 MG PO TABS
500.0000 mg | ORAL_TABLET | Freq: Two times a day (BID) | ORAL | 1 refills | Status: AC
Start: 2023-08-24 — End: 2023-08-30

## 2023-08-24 MED ORDER — NORETHINDRONE 0.35 MG PO TABS: 1.0000 | ORAL_TABLET | Freq: Every day | ORAL | 11 refills | Status: DC

## 2023-08-24 NOTE — MAU Provider Note (Addendum)
Pt says she didn't know she was preg and last night at 7pm- she had baby in toilet . ( Has picture )  In triage - pad on - small amt spots.  Has pain on right side of abd and down right leg - feels numb - started 7am  10/10 Now pain is 7.5/10      [name] is a 27 y.o. [phone]  female at Unknown who presents to MAU today with complaint of spontaneous miscarriage.  Pt states that about 1900 9/16 she passed a fetus roughly 4x2", had picture that looks clearly to be an early fetus.  She was complaining of light VB and some R sided abdomen pain with RLE numbness that improves with ambulation, started about 12 hours before the passing of fetus.  Reports pain 7.5/10 in triage, reports 5/10 now. Had miscarriage as well early this year, saw Dr. Debroah Loop in 05/2023 with <1 Hcg.     Pertinent items noted in HPI and remainder of comprehensive ROS otherwise negative.   O BP 127/76 (BP Location: Right Arm)   Pulse 79   Temp 98.1 F (36.7 C) (Oral)   Resp 14   Ht 5\' 3"  (1.6 m)   Wt 84.8 kg   BMI 33.13 kg/m  Physical Exam Constitutional:      General: She is not in acute distress.    Appearance: She is well-developed and normal weight. She is not ill-appearing.  HENT:     Head: Normocephalic and atraumatic.  Eyes:     Extraocular Movements: Extraocular movements intact.  Cardiovascular:     Rate and Rhythm: Normal rate.  Pulmonary:     Effort: Pulmonary effort is normal. No respiratory distress.  Abdominal:     General: Abdomen is flat.  Genitourinary:    Vagina: Normal.     Cervix: Normal.     Comments: Small amount of vaginal bleeding from closed external cervical os, no apparent retained products in cervical os or vaginal vault. Handled examination w/o any complaint of pain or need for pain medications.  Skin:    General: Skin is warm and dry.  Neurological:     General: No focal deficit present.     Mental Status: She is alert and oriented to person, place, and time.   Psychiatric:        Mood and Affect: Mood normal.        Behavior: Behavior normal.      MDM: moderate MAU Course:  Pt with apparent miscarriage with clear fetus in picture.  Cervix closed with scant bleeding.     A&P: #miscarriage, spontaneous, recurrent - unfortunately pt did not have specimen for anora testing; however, pt tid have picture to confirm miscarriage - would like to do minipill for contraception while awaiting to establish with OBGYN where she is thinking Nexplanon vs IUD -prescribing PNVs as well (given list of providers at DC) -prescribing Naproxen 500mg  BID for 3 days given R pelvic cramping pain   Discharge from MAU in stable condition with strict / usual precautions Follow up at desired OBGYN provider as scheduled for ongoing prenatal care  Allergies as of 08/24/2023   No Known Allergies      Medication List     STOP taking these medications    ibuprofen 400 MG tablet Commonly known as: ADVIL   norgestimate-ethinyl estradiol 0.25-35 MG-MCG tablet Commonly known as: ORTHO-CYCLEN   Semaglutide-Weight Management 1.7 MG/0.75ML Soaj   SUMAtriptan 50 MG tablet Commonly known  as: Imitrex   topiramate 100 MG tablet Commonly known as: TOPAMAX   Zafemy 150-35 MCG/24HR transdermal patch Generic drug: norelgestromin-ethinyl estradiol       TAKE these medications    naproxen 500 MG tablet Commonly known as: Naprosyn Take 1 tablet (500 mg total) by mouth 2 (two) times daily with a meal for 6 days.   norethindrone 0.35 MG tablet Commonly known as: MICRONOR Take 1 tablet (0.35 mg total) by mouth daily.   PrePLUS 27-1 MG Tabs Take 1 tablet by mouth daily.        Hessie Dibble, MD 08/24/2023 12:33 AM

## 2023-10-06 ENCOUNTER — Ambulatory Visit (INDEPENDENT_AMBULATORY_CARE_PROVIDER_SITE_OTHER): Payer: BC Managed Care – PPO | Admitting: Internal Medicine

## 2023-10-13 ENCOUNTER — Encounter: Payer: Self-pay | Admitting: Obstetrics & Gynecology

## 2023-10-13 ENCOUNTER — Ambulatory Visit: Payer: BC Managed Care – PPO | Admitting: Emergency Medicine

## 2023-10-13 VITALS — BP 125/83 | HR 83 | Wt 187.1 lb

## 2023-10-13 DIAGNOSIS — Z3201 Encounter for pregnancy test, result positive: Secondary | ICD-10-CM | POA: Diagnosis not present

## 2023-10-13 LAB — POCT URINE PREGNANCY: Preg Test, Ur: POSITIVE — AB

## 2023-10-13 NOTE — Progress Notes (Signed)
[name] presents today for UPT. She has no unusual complaints and complains of nausea without vomiting for 3 days. LMP: 09/18/23    OBJECTIVE: Appears well, in no apparent distress.  OB History     Gravida  6   Para  4   Term  4   Preterm      AB  2   Living  4      SAB  2   IAB      Ectopic      Multiple  0   Live Births  4          Home UPT Result: positive In-Office UPT result: positive  I have reviewed the patient's medical, obstetrical, social, and family histories, and medications.   ASSESSMENT: Positive pregnancy test  PLAN Prenatal care to be completed at: [facility]

## 2023-10-14 ENCOUNTER — Other Ambulatory Visit: Payer: Self-pay

## 2023-10-14 DIAGNOSIS — Z3201 Encounter for pregnancy test, result positive: Secondary | ICD-10-CM | POA: Diagnosis not present

## 2023-10-14 MED ORDER — PROGESTERONE 200 MG PO CAPS: ORAL_CAPSULE | ORAL | 2 refills | Status: DC

## 2023-10-18 DIAGNOSIS — N925 Other specified irregular menstruation: Secondary | ICD-10-CM | POA: Diagnosis not present

## 2023-10-19 ENCOUNTER — Ambulatory Visit (INDEPENDENT_AMBULATORY_CARE_PROVIDER_SITE_OTHER): Payer: BC Managed Care – PPO | Admitting: Family

## 2023-10-19 VITALS — BP 120/76 | HR 98 | Temp 98.5°F | Ht 63.0 in | Wt 192.0 lb

## 2023-10-19 DIAGNOSIS — F32A Depression, unspecified: Secondary | ICD-10-CM

## 2023-10-19 DIAGNOSIS — F419 Anxiety disorder, unspecified: Secondary | ICD-10-CM | POA: Diagnosis not present

## 2023-10-19 NOTE — Progress Notes (Unsigned)
Patient is here for accomodation form to be completed.  Patient wants to speak about the anxiety.

## 2023-10-19 NOTE — Progress Notes (Unsigned)
Patient ID: Jillian Bishop, female    DOB: [birth year]  MRN: [id number]  CC: Accommodation Form   Subjective: Jillian Bishop is a 27 y.o. female who presents for accommodation form.   Her concerns today include:  Patient presents today with accommodation form related to anxiety depression. Patient states anxiety depression related to work-life balance. States she is having issues with her children's father. Also, reports she is [redacted] weeks pregnant and established with Obstetrics Gynecology. She has not been prescribed anxiety depression medication. She is not established with Psychiatry/therapist. Reports she is a full time employee at [employer]. She requests accommodation form be completed so that she can work from home for unspecified amount of time (patient was unable to answer). She denies thoughts of self-harm, suicidal ideations, homicidal ideations.   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 30 tablet 13   progesterone (PROMETRIUM) 200 MG capsule Place one capsule vaginally at bedtime 30 capsule 2   norethindrone (MICRONOR) 0.35 MG tablet Take 1 tablet (0.35 mg total) by mouth daily. (Patient not taking: Reported on 10/13/2023) 30 tablet 11   No current facility-administered medications on file prior to visit.    No Known  Allergies  Social History   Socioeconomic History   Marital status: Single    Spouse name: Not on file   Number of children: Not on file   Years of education: Not on file   Highest education level: 12th grade  Occupational History   Not on file  Tobacco Use   Smoking status: Never    Passive exposure: Never   Smokeless tobacco: Never  Vaping Use   Vaping status: Never Used  Substance and Sexual Activity   Alcohol use: No   Drug use: No   Sexual activity: Yes    Birth control/protection: None  Other Topics Concern   Not on file  Social History Narrative   Not on file   Social Determinants of Health   Financial Resource Strain: Low Risk  (10/19/2023)   Overall Financial Resource Strain (CARDIA)    Difficulty of Paying Living Expenses: Not hard at all  Food Insecurity: No Food Insecurity (10/19/2023)   Hunger Vital Sign    Worried About Running Out of Food in the Last Year: Never true    Ran Out of Food in the Last Year: Never true  Transportation Needs: No Transportation Needs (10/19/2023)   PRAPARE - Administrator, Civil Service (Medical): No    Lack of Transportation (Non-Medical): No  Physical Activity: Sufficiently Active (10/19/2023)   Exercise Vital Sign    Days of Exercise per Week: 3 days    Minutes of Exercise per Session: 60 min  Stress: Stress Concern Present (10/19/2023)   [name] of Occupational Health - Occupational Stress Questionnaire  Feeling of Stress : Rather much  Social Connections: Moderately Integrated (10/19/2023)   Social Connection and Isolation Panel [NHANES]    Frequency of Communication with Friends and Family: More than three times a week    Frequency of Social Gatherings with Friends and Family: Twice a week    Attends Religious Services: More than 4 times per year    Active Member of [employer] or Organizations: No    Attends Engineer, structural: Not on file    Marital Status: Living with partner   Intimate Partner Violence: Not on file    Family History  Problem Relation Age of Onset   Hypertension Mother    Diabetes Mother    Hypertension Father    Diabetes Father    Hypertension Sister    Heart disease Paternal Grandmother    Hypertension Paternal Grandmother    Diabetes Maternal Grandmother    Hypertension Maternal Grandmother    Diabetes Maternal Aunt    Diabetes Sister     Past Surgical History:  Procedure Laterality Date   APPENDECTOMY  age 31 or 3   DILATION AND EVACUATION N/A 11/19/2018   Procedure: DILATATION AND EVACUATION;  Surgeon: Huel Cote, MD;  Location: [facility];  Service: Gynecology;  Laterality: N/A;   LAPAROSCOPIC OVARIAN CYSTECTOMY Right 09/05/2020   Procedure: LAPAROSCOPIC OVARIAN CYSTECTOMY LYSIS OF ADHESISONS;  Surgeon: Edwinna Areola, DO;  Location: [facility];  Service: Gynecology;  Laterality: Right;    ROS: Review of Systems Negative except as stated above  PHYSICAL EXAM: BP 120/76   Pulse 98   Temp 98.5 F (36.9 C) (Oral)   Ht 5\' 3"  (1.6 m)   Wt 192 lb (87.1 kg)   LMP 09/18/2023   SpO2 98%   BMI 34.01 kg/m   Physical Exam HENT:     Head: Normocephalic and atraumatic.     Nose: Nose normal.     Mouth/Throat:     Mouth: Mucous membranes are moist.     Pharynx: Oropharynx is clear.  Eyes:     Extraocular Movements: Extraocular movements intact.     Conjunctiva/sclera: Conjunctivae normal.     Pupils: Pupils are equal, round, and reactive to light.  Cardiovascular:     Rate and Rhythm: Normal rate and regular rhythm.     Pulses: Normal pulses.     Heart sounds: Normal heart sounds.  Pulmonary:     Effort: Pulmonary effort is normal.     Breath sounds: Normal breath sounds.  Musculoskeletal:        General: Normal range of motion.     Cervical back: Normal range of motion and neck supple.  Neurological:     General: No focal deficit present.     Mental Status: She is alert and oriented to  person, place, and time.  Psychiatric:        Mood and Affect: Mood normal.        Behavior: Behavior normal.     ASSESSMENT AND PLAN: Note - I consulted with Georganna Skeans, MD patient's plan of care.   1. Anxiety and depression - Patient denies thoughts of self-harm, suicidal ideations, homicidal ideations. - I discussed with patient in detail if she prefers to begin anxiety depression medication to consult with established Obstetrics Gynecology due to she is pregnant. Patient verbalized understanding.  - I discussed with patient in detail I will be unable to complete accommodation form. Referred to Psychiatry for completion of accommodation form if appropriate. Patient  verbalized understanding. - Ambulatory referral to Psychiatry   Patient was given the opportunity to ask questions.  Patient verbalized understanding of the plan and was able to repeat key elements of the plan. Patient was given clear instructions to go to Emergency Department or return to medical center if symptoms don't improve, worsen, or new problems develop.The patient verbalized understanding.   Orders Placed This Encounter  Procedures   Ambulatory referral to Psychiatry    Follow-up with primary provider as scheduled.   Rema Fendt, NP

## 2023-10-20 ENCOUNTER — Ambulatory Visit (INDEPENDENT_AMBULATORY_CARE_PROVIDER_SITE_OTHER): Payer: BC Managed Care – PPO | Admitting: Internal Medicine

## 2023-10-26 ENCOUNTER — Inpatient Hospital Stay (HOSPITAL_COMMUNITY)
Admission: AD | Admit: 2023-10-26 | Discharge: 2023-10-26 | Disposition: A | Discharge status: Home / Self Care | Payer: BC Managed Care – PPO | Attending: Obstetrics and Gynecology | Admitting: Obstetrics and Gynecology

## 2023-10-26 ENCOUNTER — Inpatient Hospital Stay (HOSPITAL_COMMUNITY): Payer: BC Managed Care – PPO

## 2023-10-26 ENCOUNTER — Encounter (HOSPITAL_COMMUNITY): Payer: Self-pay

## 2023-10-26 DIAGNOSIS — Z3A01 Less than 8 weeks gestation of pregnancy: Secondary | ICD-10-CM | POA: Diagnosis not present

## 2023-10-26 DIAGNOSIS — O368120 Decreased fetal movements, second trimester, not applicable or unspecified: Secondary | ICD-10-CM | POA: Diagnosis not present

## 2023-10-26 DIAGNOSIS — O26891 Other specified pregnancy related conditions, first trimester: Secondary | ICD-10-CM | POA: Insufficient documentation

## 2023-10-26 DIAGNOSIS — Z3A37 37 weeks gestation of pregnancy: Secondary | ICD-10-CM | POA: Diagnosis not present

## 2023-10-26 DIAGNOSIS — Z3A00 Weeks of gestation of pregnancy not specified: Secondary | ICD-10-CM | POA: Diagnosis not present

## 2023-10-26 DIAGNOSIS — R109 Unspecified abdominal pain: Secondary | ICD-10-CM | POA: Diagnosis not present

## 2023-10-26 DIAGNOSIS — O23591 Infection of other part of genital tract in pregnancy, first trimester: Secondary | ICD-10-CM

## 2023-10-26 DIAGNOSIS — Z3490 Encounter for supervision of normal pregnancy, unspecified, unspecified trimester: Secondary | ICD-10-CM

## 2023-10-26 DIAGNOSIS — O208 Other hemorrhage in early pregnancy: Secondary | ICD-10-CM | POA: Diagnosis not present

## 2023-10-26 LAB — ABO/RH: ABO/RH(D): O POS

## 2023-10-26 LAB — CBC
HCT: 34.6 % — ABNORMAL LOW (ref 36.0–46.0)
Hemoglobin: 11.1 g/dL — ABNORMAL LOW (ref 12.0–15.0)
MCH: 26.7 pg (ref 26.0–34.0)
MCHC: 32.1 g/dL (ref 30.0–36.0)
MCV: 83.2 fL (ref 80.0–100.0)
Platelets: 357 10*3/uL (ref 150–400)
RBC: 4.16 MIL/uL (ref 3.87–5.11)
RDW: 15.7 % — ABNORMAL HIGH (ref 11.5–15.5)
WBC: 7.4 10*3/uL (ref 4.0–10.5)
nRBC: 0 % (ref 0.0–0.2)

## 2023-10-26 LAB — WET PREP, GENITAL
Sperm: NONE SEEN
Trich, Wet Prep: NONE SEEN
WBC, Wet Prep HPF POC: <10 (ref <10)

## 2023-10-26 LAB — URINALYSIS, ROUTINE W REFLEX MICROSCOPIC
Bacteria, UA: NONE SEEN
Bilirubin Urine: NEGATIVE
Glucose, UA: NEGATIVE mg/dL
Hgb urine dipstick: NEGATIVE
Ketones, ur: NEGATIVE mg/dL
Nitrite: NEGATIVE
Protein, ur: NEGATIVE mg/dL
Specific Gravity, Urine: 1.025 (ref 1.005–1.030)
pH: 7 (ref 5.0–8.0)

## 2023-10-26 LAB — HCG, QUANTITATIVE, PREGNANCY: hCG, Beta Chain, Quant, S: 12087 m[IU]/mL — ABNORMAL HIGH (ref <5)

## 2023-10-26 MED ORDER — METRONIDAZOLE 500 MG PO TABS
500.0000 mg | ORAL_TABLET | Freq: Two times a day (BID) | ORAL | 0 refills | Status: DC
Start: 2023-10-26 — End: 2023-12-24

## 2023-10-26 MED ORDER — TERCONAZOLE 0.4 % VA CREA
1.0000 | TOPICAL_CREAM | Freq: Every day | VAGINAL | 0 refills | Status: DC
Start: 2023-10-26 — End: 2023-12-24

## 2023-10-26 MED ORDER — ACETAMINOPHEN-CAFFEINE 500-65 MG PO TABS
2.0000 | ORAL_TABLET | Freq: Once | ORAL | Status: AC
  Administered 2023-10-26: 2 via ORAL
  Filled 2023-10-26: qty 2

## 2023-10-26 NOTE — MAU Note (Addendum)
...  Jillian Bishop is a 27 y.o. at [redacted]w[redacted]d here in MAU reporting: Intermittent bilateral lower abdominal cramping, mid lower abdominal cramping, and HA. She reports she has had a HA for the past three days and has taken 500 mg of Tylenol twice. Last dose last night, did not help. Was unaware she could take two. Denies vaginal itching, vaginal discharge, and vaginal odors. Denies VB.   Taking vaginal progesterone each night. Does not have her appendix. Hx dermoid and teratoma of right ovary.  Onset of complaint: "a few days"  Pain score:  9/10 lower abdomen 9/10 bilateral lower abdomen  Vitals:   10/26/23 0957  BP: (!) 121/58  Pulse: 87  Resp: 16  Temp: 98.2 F (36.8 C)  SpO2: 100%     FHT: n/a Lab orders placed from triage: UA

## 2023-10-26 NOTE — MAU Provider Note (Signed)
History     CSN: [id number]  Arrival date and time: 10/26/23 0254   Event Date/Time   First Provider Initiated Contact with Patient 10/26/23 1009      Chief Complaint  Patient presents with   Headache   Abdominal Pain   Jillian Bishop presents with a HA for the past 3 days for which she has tried Tylenol 500mg , last dose last night at 2000 without relief. She reports lower right-sided abdominal pain which is intermittent in nature. She denies VB, vaginal itching or discharge. Pertinent reported history includes appendectomy.   Headache  Associated symptoms include abdominal pain and nausea. Pertinent negatives include no coughing, fever or vomiting.  Abdominal Pain Associated symptoms include headaches and nausea. Pertinent negatives include no constipation, diarrhea, fever, frequency or vomiting.    OB History     Gravida  7   Para  4   Term  4   Preterm      AB  2   Living  4      SAB  2   IAB      Ectopic      Multiple  0   Live Births  4           Past Medical History:  Diagnosis Date   Asthma    mild well controlled, no inhaler use since 2016   Benign teratoma of ovary, right    COVID-19 11/02/2019   sore throat cough, sob loss of taste and smell, cough until 03-11-2020 all symptoms resolved now   Dermoid cyst    GERD (gastroesophageal reflux disease)    Headache    tension, occ migraine   HPV (human papilloma virus) infection     Past Surgical History:  Procedure Laterality Date   APPENDECTOMY  age 66 or 3   DILATION AND EVACUATION N/A 11/19/2018   Procedure: DILATATION AND EVACUATION;  Surgeon: Huel Cote, MD;  Location: [facility];  Service: Gynecology;  Laterality: N/A;   LAPAROSCOPIC OVARIAN CYSTECTOMY Right 09/05/2020   Procedure: LAPAROSCOPIC OVARIAN CYSTECTOMY LYSIS OF ADHESISONS;  Surgeon: Edwinna Areola, DO;  Location: [facility];  Service: Gynecology;  Laterality: Right;    Family History  Problem  Relation Age of Onset   Hypertension Mother    Diabetes Mother    Hypertension Father    Diabetes Father    Hypertension Sister    Heart disease Paternal Grandmother    Hypertension Paternal Grandmother    Diabetes Maternal Grandmother    Hypertension Maternal Grandmother    Diabetes Maternal Aunt    Diabetes Sister     Social History   Tobacco Use   Smoking status: Never    Passive exposure: Never   Smokeless tobacco: Never  Vaping Use   Vaping status: Never Used  Substance Use Topics   Alcohol use: No   Drug use: No    Allergies: No Known Allergies  Medications Prior to Admission  Medication Sig Dispense Refill Last Dose   Prenatal Vit-Fe Fumarate-FA (PREPLUS) 27-1 MG TABS Take 1 tablet by mouth daily. 30 tablet 13 10/25/2023   progesterone (PROMETRIUM) 200 MG capsule Place one capsule vaginally at bedtime 30 capsule 2 10/25/2023   norethindrone (MICRONOR) 0.35 MG tablet Take 1 tablet (0.35 mg total) by mouth daily. (Patient not taking: Reported on 10/13/2023) 30 tablet 11     Review of Systems  Constitutional:  Negative for chills, fatigue, fever and unexpected weight change.  Respiratory:  Negative for cough and  shortness of breath.   Cardiovascular:  Negative for chest pain and palpitations.  Gastrointestinal:  Positive for abdominal pain and nausea. Negative for constipation, diarrhea and vomiting.  Genitourinary:  Negative for difficulty urinating, flank pain, frequency and urgency.  Neurological:  Positive for headaches.   Physical Exam   Blood pressure (!) 121/58, pulse 87, temperature 98.2 F (36.8 C), temperature source Oral, resp. rate 16, height 5\' 3"  (1.6 m), weight 88.5 kg, last menstrual period 09/18/2023, SpO2 100%.  Physical Exam Vitals reviewed.  Constitutional:      Appearance: Normal appearance.  HENT:     Head: Normocephalic.  Cardiovascular:     Rate and Rhythm: Normal rate.     Pulses: Normal pulses.  Pulmonary:     Effort: Pulmonary  effort is normal.  Skin:    General: Skin is warm and dry.     Capillary Refill: Capillary refill takes less than 2 seconds.  Neurological:     Mental Status: She is alert and oriented to person, place, and time.  Psychiatric:        Mood and Affect: Mood normal.        Behavior: Behavior normal.        Thought Content: Thought content normal.        Judgment: Judgment normal.       MAU Course  No results found for this or any previous visit (from the past 24 hour(s)). No results found.  MDM PE Benign Labs: [place], CMP, CBC, Hcg, Wet prep Medication  Assessment and Plan  27yo G7 [id number]  SIUP at 5.3 weeks   - Exam findings discussed. - Follow up in 10-14 days for viability scan at [facility], message sent to office. - Prescriptions for BV and yeast sent. - May return to MAU as needed. - Discharged home in stable condition.    Richardson Landry MSN, CNM 10/26/2023, 10:29 AM

## 2023-10-27 LAB — GC/CHLAMYDIA PROBE AMP (~~LOC~~) NOT AT ARMC
Chlamydia: NEGATIVE
Comment: NEGATIVE
Comment: NORMAL
Neisseria Gonorrhea: NEGATIVE

## 2023-11-10 ENCOUNTER — Other Ambulatory Visit: Payer: Self-pay | Admitting: Obstetrics & Gynecology

## 2023-11-10 ENCOUNTER — Ambulatory Visit: Payer: BC Managed Care – PPO | Admitting: *Deleted

## 2023-11-10 ENCOUNTER — Other Ambulatory Visit (INDEPENDENT_AMBULATORY_CARE_PROVIDER_SITE_OTHER): Payer: BC Managed Care – PPO

## 2023-11-10 VITALS — BP 115/69 | HR 83 | Wt 193.6 lb

## 2023-11-10 DIAGNOSIS — O3680X0 Pregnancy with inconclusive fetal viability, not applicable or unspecified: Secondary | ICD-10-CM

## 2023-11-10 DIAGNOSIS — Z3A01 Less than 8 weeks gestation of pregnancy: Secondary | ICD-10-CM

## 2023-11-10 DIAGNOSIS — O0990 Supervision of high risk pregnancy, unspecified, unspecified trimester: Secondary | ICD-10-CM | POA: Diagnosis not present

## 2023-11-10 DIAGNOSIS — O0991 Supervision of high risk pregnancy, unspecified, first trimester: Secondary | ICD-10-CM

## 2023-11-10 DIAGNOSIS — O219 Vomiting of pregnancy, unspecified: Secondary | ICD-10-CM

## 2023-11-10 DIAGNOSIS — Z3480 Encounter for supervision of other normal pregnancy, unspecified trimester: Secondary | ICD-10-CM | POA: Insufficient documentation

## 2023-11-10 DIAGNOSIS — Z1339 Encounter for screening examination for other mental health and behavioral disorders: Secondary | ICD-10-CM

## 2023-11-10 MED ORDER — SCOPOLAMINE 1 MG/3DAYS TD PT72: 1.0000 | MEDICATED_PATCH | TRANSDERMAL | 12 refills | Status: DC

## 2023-11-10 MED ORDER — FAMOTIDINE 20 MG PO TABS
20.0000 mg | ORAL_TABLET | Freq: Two times a day (BID) | ORAL | 3 refills | Status: AC
Start: 2023-11-10 — End: ?

## 2023-11-10 NOTE — Patient Instructions (Signed)
[facility] [employer] has a partnership with the [employer] to provide prenatal navigation for the most needed resources in our community. In order to see how we can help connect you to these resources we need consent to contact you. Please complete the very short consent using the link below:   English Link: https://[place].tfaforms.net/283?site=16  Spanish Link: https://[place].tfaforms.net/287?site=16  Our practice his participating in a study that provides no-cost doula care. ACURE4Moms is a study looking at how doula care can reduce birthing disparities for Black and brown birthing people. We like to refer patients as soon as possible, but definitely before 28 weeks so patients can get to know their doula.    A doula is trained to provide support before, during and just after you give birth. While doulas do not provide medical care, they do provide emotional, physical and educational support. Doulas can help reduce your stress and comfort you and your partner. They can help you cope with labor by helping you use breathing techniques, massage, creative labor positioning, essential oils and affirmations.   ACURE4Moms is a research study trying to reduce:   low birthweight babies  emergency department visits & hospitalizations for birthing persons and their babies  depression among birthing people  discrimination in pregnancy-related care ACURE4Moms is trying out 2 programs designed by  people who have given birth. These programs include: 1. Sharing patient data and warning alerts with clinic staff to keep them accountable for their patients' outcomes and providing tools to help them  reduce bias in care. 2. Matching eligible patients with doulas from the  same community as the patients.  If you would like to participate in this study, please visit:   http://carroll-castaneda.info/  Options for Doula Care in the [place] Area  As you review  your birthing options, consider having a birth doula. A doula is trained to provide support before, during and just after you give birth. There are also postpartum doulas that help you adjust to new parenthood.  While doulas do not provide medical care, they do provide emotional, physical and educational support. A few months before your baby arrives, doulas can help answer questions, ease concerns and help you create and support your birthing plan.    Doulas can help reduce your stress and comfort you and your partner. They can help you cope with labor by helping you use breathing techniques, massage, creative labor positioning, essential oils and affirmations.   Studies show that the benefits of having a doula include:   A more positive birth experience  Fewer requests for pain-relief medication  Less likelihood of cesarean section, commonly called a c-section   Doulas are typically hired via a Ad[employer] account planner between you and the doula. We are happy to provide a list of the most active doulas in the area, all of whom are credentialed by [facility] and will not count as a visitor at your birth.  There are several options for no-cost doula care at our hospital, including:  [facility] Volunteer Doula Program Every [employer] Program A Cure 4 Moms Doula Study (available only at [employer] [facility], [place], [place] and [facility] offices)  For more information on these programs or to receive a list of doulas active in our area, please email [email or web address]

## 2023-11-10 NOTE — Progress Notes (Signed)
New OB Intake  I connected with Jillian Bishop  on 11/10/23 at  8:15 AM EST by In Person Visit and verified that I am speaking with the correct person using two identifiers. Nurse is located at [facility] and pt is located at [place].  I discussed the limitations, risks, security and privacy concerns of performing an evaluation and management service by telephone and the availability of in person appointments. I also discussed with the patient that there may be a patient responsible charge related to this service. The patient expressed understanding and agreed to proceed.  I explained I am completing New OB Intake today. We discussed EDD of 06/24/2024, by Last Menstrual Period. Pt is [id number]. I reviewed her allergies, medications and Medical/Surgical/OB history.    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 Discussed the nature of our practice with multiple providers including residents and students. Due to the size of the practice, the delivering provider may not be the same as those providing prenatal care.   Patient is interested in water birth. Offered upcoming OB visit with CNM to discuss further.  MyChart/Babyscripts MyChart access verified. I explained pt will have some visits in office and some virtually. Babyscripts instructions given and order placed. Patient verifies receipt of registration text/e-mail. Account successfully created and  app downloaded.  Blood Pressure Cuff/Weight Scale  Pt will purchase BP cuff. Explained after first prenatal appt pt will check weekly and document in Babyscripts. Patient does not have weight scale; patient may purchase if they desire to track weight weekly in Babyscripts.  Anatomy US Explained first scheduled [place] will be around 19 weeks. Anatomy US scheduled for TBD at TBD.  Interested in [place]? If yes, send referral and doula dot phrase.   Is patient a candidate for Babyscripts Optimization? No: High Risk   First visit review I reviewed new OB appt with patient. Explained pt will be seen by Dr. Donavan Foil at first visit. Discussed Avelina Laine genetic screening with patient. Requests Panorama and Horizon.. Routine prenatal labs  collected at today's visit.    Last Pap Diagnosis  Date Value Ref Range Status  06/04/2023   Final   - Negative for Intraepithelial Lesions or Malignancy (NILM)  06/04/2023 - Benign reactive/reparative changes  Final    Harrel Lemon, RN 11/10/2023  8:16 AM

## 2023-11-11 LAB — CBC/D/PLT+RPR+RH+ABO+RUBIGG...
Antibody Screen: NEGATIVE
Basophils Absolute: 0.1 10*3/uL (ref 0.0–0.2)
Basos: 1 %
EOS (ABSOLUTE): 0.1 10*3/uL (ref 0.0–0.4)
Eos: 1 %
HCV Ab: NONREACTIVE
HIV Screen 4th Generation wRfx: NONREACTIVE
Hematocrit: 37.2 % (ref 34.0–46.6)
Hemoglobin: 12 g/dL (ref 11.1–15.9)
Hepatitis B Surface Ag: NEGATIVE
Immature Grans (Abs): 0 10*3/uL (ref 0.0–0.1)
Immature Granulocytes: 0 %
Lymphocytes Absolute: 1.5 10*3/uL (ref 0.7–3.1)
Lymphs: 17 %
MCH: 27.1 pg (ref 26.6–33.0)
MCHC: 32.3 g/dL (ref 31.5–35.7)
MCV: 84 fL (ref 79–97)
Monocytes Absolute: 0.7 10*3/uL (ref 0.1–0.9)
Monocytes: 8 %
Neutrophils Absolute: 6.1 10*3/uL (ref 1.4–7.0)
Neutrophils: 73 %
Platelets: 392 10*3/uL (ref 150–450)
RBC: 4.42 x10E6/uL (ref 3.77–5.28)
RDW: 13.7 % (ref 11.7–15.4)
RPR Ser Ql: NONREACTIVE
Rh Factor: POSITIVE
Rubella Antibodies, IGG: 1.57 {index} (ref >0.99)
WBC: 8.4 10*3/uL (ref 3.4–10.8)

## 2023-11-11 LAB — TSH RFX ON ABNORMAL TO FREE T4: TSH: 1.28 u[IU]/mL (ref 0.450–4.500)

## 2023-11-11 LAB — HEMOGLOBIN A1C
Est. average glucose Bld gHb Est-mCnc: 103 mg/dL
Hgb A1c MFr Bld: 5.2 % (ref 4.8–5.6)

## 2023-11-11 LAB — HCV INTERPRETATION

## 2023-11-12 LAB — URINE CULTURE, OB REFLEX

## 2023-11-12 LAB — CULTURE, OB URINE

## 2023-11-19 DIAGNOSIS — Z369 Encounter for antenatal screening, unspecified: Secondary | ICD-10-CM | POA: Diagnosis not present

## 2023-11-19 DIAGNOSIS — O26891 Other specified pregnancy related conditions, first trimester: Secondary | ICD-10-CM | POA: Diagnosis not present

## 2023-11-19 DIAGNOSIS — Z3A08 8 weeks gestation of pregnancy: Secondary | ICD-10-CM | POA: Diagnosis not present

## 2023-12-02 DIAGNOSIS — Z3A10 10 weeks gestation of pregnancy: Secondary | ICD-10-CM | POA: Diagnosis not present

## 2023-12-02 DIAGNOSIS — O9A211 Injury, poisoning and certain other consequences of external causes complicating pregnancy, first trimester: Secondary | ICD-10-CM | POA: Diagnosis not present

## 2023-12-02 DIAGNOSIS — Z113 Encounter for screening for infections with a predominantly sexual mode of transmission: Secondary | ICD-10-CM | POA: Diagnosis not present

## 2023-12-02 DIAGNOSIS — Z3689 Encounter for other specified antenatal screening: Secondary | ICD-10-CM | POA: Diagnosis not present

## 2023-12-06 ENCOUNTER — Other Ambulatory Visit: Payer: BC Managed Care – PPO

## 2023-12-06 ENCOUNTER — Encounter: Payer: Self-pay | Admitting: Obstetrics & Gynecology

## 2023-12-06 DIAGNOSIS — O0991 Supervision of high risk pregnancy, unspecified, first trimester: Secondary | ICD-10-CM | POA: Diagnosis not present

## 2023-12-06 DIAGNOSIS — O0990 Supervision of high risk pregnancy, unspecified, unspecified trimester: Secondary | ICD-10-CM | POA: Diagnosis not present

## 2023-12-08 NOTE — L&D Delivery Note (Signed)
 OB/GYN Faculty Practice Delivery Note  Jillian Bishop is a 28 y.o. [id number] s/p SVD at [redacted]w[redacted]d. She was admitted for FGR and poorly controlled GDMA1.   ROM: 7h 35m with clear fluid GBS Status: Negative/-- (06/24 1209)  Labor Progress: Initial SVE: 1cm. She was induced with FB and pitocin  and subsequently AROM. She then progressed to complete.   Delivery Date/Time: 06/03/24 2235 Delivery: Called to room and patient was complete and pushing. Head delivered DOA and restituted to LOA. No nuchal cord absent. Shoulder and body delivered in usual fashion. Infant with spontaneous cry, placed on mother's abdomen, dried and stimulated. Cord clamped x 2 after 1-minute delay, and cut by FOB. Cord blood drawn. Placenta delivered spontaneously with gentle cord traction. Fundus firm with massage and Pitocin . Labia, perineum, vagina, and cervix inspected inspected with no laceration.   Baby Weight: pending  Placenta: Sent to L&D Complications: None Lacerations: None EBL: 50 mL Analgesia: Epidural   Count correct: Yes  Infant:  APGAR (1 MIN):   APGAR (5 MINS):   APGAR (10 MINS):

## 2023-12-11 LAB — PANORAMA PRENATAL TEST FULL PANEL:PANORAMA TEST PLUS 5 ADDITIONAL MICRODELETIONS: FETAL FRACTION: 10.9

## 2023-12-16 ENCOUNTER — Encounter: Payer: Self-pay | Admitting: Obstetrics & Gynecology

## 2023-12-16 ENCOUNTER — Encounter: Payer: Self-pay | Admitting: Obstetrics and Gynecology

## 2023-12-16 ENCOUNTER — Telehealth: Payer: Self-pay

## 2023-12-16 ENCOUNTER — Ambulatory Visit: Payer: BC Managed Care – PPO | Admitting: Emergency Medicine

## 2023-12-16 VITALS — BP 108/67 | HR 87 | Wt 197.0 lb

## 2023-12-16 DIAGNOSIS — D563 Thalassemia minor: Secondary | ICD-10-CM

## 2023-12-16 NOTE — Telephone Encounter (Signed)
 Called pt after receiving mychart message about headaches and visual disturbances. Pt does not have BP cuff yet, advised pt to come in today for BP check, pt stated that she will come now.

## 2023-12-16 NOTE — Progress Notes (Signed)
 Pt presents with c/o headaches with spots for the last 3 days. Also has sensation of dizziness and near syncope. Also increase in fatigue in the last few days.  Following consult with Dr. Zina, MD, patient may excedrin tension. Also recommended to seek care from her neurologist.

## 2023-12-17 LAB — HORIZON CUSTOM: REPORT SUMMARY: POSITIVE — AB

## 2023-12-22 ENCOUNTER — Other Ambulatory Visit: Payer: Self-pay

## 2023-12-22 ENCOUNTER — Ambulatory Visit: Payer: BC Managed Care – PPO | Attending: Obstetrics

## 2023-12-23 DIAGNOSIS — Z3A13 13 weeks gestation of pregnancy: Secondary | ICD-10-CM | POA: Diagnosis not present

## 2023-12-23 DIAGNOSIS — O200 Threatened abortion: Secondary | ICD-10-CM | POA: Diagnosis not present

## 2023-12-24 ENCOUNTER — Ambulatory Visit: Payer: BC Managed Care – PPO | Admitting: Obstetrics and Gynecology

## 2023-12-24 ENCOUNTER — Encounter: Payer: Self-pay | Admitting: Obstetrics and Gynecology

## 2023-12-24 VITALS — BP 107/69 | HR 86 | Wt 199.0 lb

## 2023-12-24 DIAGNOSIS — Z3A13 13 weeks gestation of pregnancy: Secondary | ICD-10-CM

## 2023-12-24 DIAGNOSIS — O0991 Supervision of high risk pregnancy, unspecified, first trimester: Secondary | ICD-10-CM | POA: Diagnosis not present

## 2023-12-24 DIAGNOSIS — O0990 Supervision of high risk pregnancy, unspecified, unspecified trimester: Secondary | ICD-10-CM

## 2023-12-24 DIAGNOSIS — D563 Thalassemia minor: Secondary | ICD-10-CM | POA: Diagnosis not present

## 2023-12-24 NOTE — Progress Notes (Signed)
Pt in office for NOB. Last  pap 05/2023, had labs and initial testing at Intake appt.

## 2023-12-24 NOTE — Progress Notes (Signed)
Pt complains of SOB and chest tightness.  Pt has been monitored for this in the past. Pt does have hx of Asthma, no problems since 2016.  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

## 2023-12-24 NOTE — Progress Notes (Signed)
INITIAL PRENATAL VISIT NOTE  Subjective:  Jillian Bishop is a 28 y.o. [id number] at [redacted]w[redacted]d by LMP c/w early u/s being seen today for her initial prenatal visit.  She has an obstetric history significant for SVD x 4. She has a medical history significant for asthma and appendectomy.  Patient reports  occasional shortness of breath .  Contractions: Not present. Vag. Bleeding: None.  Movement: Present. Denies leaking of fluid.    Past Medical History:  Diagnosis Date   Asthma    mild well controlled, no inhaler use since 2016   Benign teratoma of ovary, right    COVID-19 11/02/2019   sore throat cough, sob loss of taste and smell, cough until 03-11-2020 all symptoms resolved now   Dermoid cyst    GERD (gastroesophageal reflux disease)    Headache    tension, occ migraine   HPV (human papilloma virus) infection     Past Surgical History:  Procedure Laterality Date   APPENDECTOMY  age 28 or 3   DILATION AND EVACUATION N/A 11/19/2018   Procedure: DILATATION AND EVACUATION;  Surgeon: Huel Cote, MD;  Location: [facility];  Service: Gynecology;  Laterality: N/A;   LAPAROSCOPIC OVARIAN CYSTECTOMY Right 09/05/2020   Procedure: LAPAROSCOPIC OVARIAN CYSTECTOMY LYSIS OF ADHESISONS;  Surgeon: Edwinna Areola, DO;  Location: [facility];  Service: Gynecology;  Laterality: Right;    OB History  Gravida Para Term Preterm AB Living  7 4 4  2 4   SAB IAB Ectopic Multiple Live Births  2   0 4    # Outcome Date GA Lbr Len/2nd Weight Sex Type Anes PTL Lv  7 Current           6 SAB 03/30/23          5 Term 03/11/20 [redacted]w[redacted]d 02:32 / 00:14 6 lb 15.8 oz (3.17 kg) F Vag-Spont None  LIV  4 SAB 11/09/18 [redacted]w[redacted]d   U      3 Term 04/23/17 [redacted]w[redacted]d 03:22 / 00:22 6 lb 8 oz (2.948 kg) F Vag-Spont None  LIV  2 Term 01/20/16 [redacted]w[redacted]d  7 lb 0.1 oz (3.177 kg) M Vag-Spont None  LIV  1 Term 10/03/10 [redacted]w[redacted]d  5 lb 4 oz (2.381 kg) M Vag-Spont None N LIV    Social History   Socioeconomic History    Marital status: Single    Spouse name: Not on file   Number of children: Not on file   Years of education: Not on file   Highest education level: 12th grade  Occupational History   Not on file  Tobacco Use   Smoking status: Never    Passive exposure: Never   Smokeless tobacco: Never  Vaping Use   Vaping status: Never Used  Substance and Sexual Activity   Alcohol use: No   Drug use: No   Sexual activity: Yes    Birth control/protection: None  Other Topics Concern   Not on file  Social History Narrative   Not on file   Social Drivers of Health   Financial Resource Strain: Low Risk  (10/19/2023)   Overall Financial Resource Strain (CARDIA)    Difficulty of Paying Living Expenses: Not hard at all  Food Insecurity: No Food Insecurity (10/19/2023)   Hunger Vital Sign    Worried About Running Out of Food in the Last Year: Never true    Ran Out of Food in the Last Year: Never true  Transportation Needs: No Transportation Needs (10/19/2023)  PRAPARE - Administrator, Civil Service (Medical): No    Lack of Transportation (Non-Medical): No  Physical Activity: Sufficiently Active (10/19/2023)   Exercise Vital Sign    Days of Exercise per Week: 3 days    Minutes of Exercise per Session: 60 min  Stress: Stress Concern Present (10/19/2023)   [name] of Occupational Health - Occupational Stress Questionnaire    Feeling of Stress : Rather much  Social Connections: Moderately Integrated (10/19/2023)   Social Connection and Isolation Panel [NHANES]    Frequency of Communication with Friends and Family: More than three times a week    Frequency of Social Gatherings with Friends and Family: Twice a week    Attends Religious Services: More than 4 times per year    Active Member of [employer] or Organizations: No    Attends Engineer, structural: Not on file    Marital Status: Living with partner    Family History  Problem Relation Age of Onset   Hypertension  Mother    Diabetes Mother    Hypertension Father    Diabetes Father    Hypertension Sister    Hypertension Sister    Diabetes Sister    Diabetes Maternal Aunt    Diabetes Maternal Grandmother    Hypertension Maternal Grandmother    Heart disease Paternal Grandmother    Hypertension Paternal Grandmother      Current Outpatient Medications:    Prenatal Vit-Fe Fumarate-FA (PREPLUS) 27-1 MG TABS, Take 1 tablet by mouth daily., Disp: 30 tablet, Rfl: 13   scopolamine (TRANSDERM-SCOP) 1 MG/3DAYS, Place 1 patch (1.5 mg total) onto the skin every 3 (three) days., Disp: 10 patch, Rfl: 12   famotidine (PEPCID) 20 MG tablet, Take 1 tablet (20 mg total) by mouth 2 (two) times daily., Disp: 60 tablet, Rfl: 3   progesterone (PROMETRIUM) 200 MG capsule, Place one capsule vaginally at bedtime, Disp: 30 capsule, Rfl: 2  No Known Allergies  Review of Systems: Negative except for what is mentioned in HPI.  Objective:   Vitals:   12/24/23 0918  BP: 107/69  Pulse: 86  Weight: 199 lb (90.3 kg)    Fetal Status: Fetal Heart Rate (bpm): 150   Movement: Present     Physical Exam: BP 107/69   Pulse 86   Wt 199 lb (90.3 kg)   LMP 09/18/2023 (Approximate)   BMI 35.25 kg/m  CONSTITUTIONAL: Well-developed, well-nourished female in no acute distress.  NEUROLOGIC: Alert and oriented to person, place, and time. Normal reflexes, muscle tone coordination. No cranial nerve deficit noted. PSYCHIATRIC: Normal mood and affect. Normal behavior. Normal judgment and thought content. SKIN: Skin is warm and dry. No rash noted. Not diaphoretic. No erythema. No pallor. HENT:  Normocephalic, atraumatic, External right and left ear normal. Oropharynx is clear and moist EYES: Conjunctivae and EOM are normal.  NECK: Normal range of motion, supple, no masses CARDIOVASCULAR: Normal heart rate noted, regular rhythm RESPIRATORY: Effort and breath sounds normal, no problems with respiration noted BREASTS:  deferred ABDOMEN: Soft, nontender, nondistended, gravid. [id number] MUSCULOSKELETAL: Normal range of motion. EXT:  No edema and no tenderness. 2+ distal pulses.   Assessment and Plan:  Pregnancy: [id number] at [redacted]w[redacted]d by LMP  1. Alpha thalassemia silent carrier (Primary) Pt is aware again declined genetic counseling  2. Supervision of high risk pregnancy, antepartum Continue routine prenatal care  3. [redacted] weeks gestation of pregnancy    Preterm labor symptoms and general obstetric precautions including but not limited  to vaginal bleeding, contractions, leaking of fluid and fetal movement were reviewed in detail with the patient.  Please refer to After Visit Summary for other counseling recommendations.   Return in about 4 weeks (around 01/21/2024) for ROB, in person.  Warden Fillers 12/24/2023 9:56 AM

## 2023-12-28 ENCOUNTER — Encounter: Payer: Self-pay | Admitting: Obstetrics and Gynecology

## 2024-01-06 ENCOUNTER — Encounter: Payer: Self-pay | Admitting: Obstetrics & Gynecology

## 2024-01-06 ENCOUNTER — Telehealth: Payer: Self-pay

## 2024-01-06 NOTE — Telephone Encounter (Signed)
S/w pt about mychart message complaining of back and left sided shoulder pain. Pt states she took her BP and it was 132/53, pulse 91. No fever. Pt states no relief from Tylenol. Pt will come into office tomorrow for evaluation, advised if symptoms progress go to mau, pt agreed.

## 2024-01-07 ENCOUNTER — Ambulatory Visit (INDEPENDENT_AMBULATORY_CARE_PROVIDER_SITE_OTHER): Payer: BC Managed Care – PPO | Admitting: Obstetrics and Gynecology

## 2024-01-07 VITALS — BP 111/64 | HR 77 | Wt 199.8 lb

## 2024-01-07 DIAGNOSIS — Z8249 Family history of ischemic heart disease and other diseases of the circulatory system: Secondary | ICD-10-CM

## 2024-01-07 DIAGNOSIS — M549 Dorsalgia, unspecified: Secondary | ICD-10-CM

## 2024-01-07 DIAGNOSIS — Z3A15 15 weeks gestation of pregnancy: Secondary | ICD-10-CM

## 2024-01-07 DIAGNOSIS — J45909 Unspecified asthma, uncomplicated: Secondary | ICD-10-CM

## 2024-01-07 DIAGNOSIS — D563 Thalassemia minor: Secondary | ICD-10-CM

## 2024-01-07 DIAGNOSIS — O99112 Other diseases of the blood and blood-forming organs and certain disorders involving the immune mechanism complicating pregnancy, second trimester: Secondary | ICD-10-CM

## 2024-01-07 DIAGNOSIS — O0990 Supervision of high risk pregnancy, unspecified, unspecified trimester: Secondary | ICD-10-CM

## 2024-01-07 DIAGNOSIS — O99892 Other specified diseases and conditions complicating childbirth: Secondary | ICD-10-CM

## 2024-01-07 DIAGNOSIS — G43709 Chronic migraine without aura, not intractable, without status migrainosus: Secondary | ICD-10-CM

## 2024-01-07 DIAGNOSIS — Z6834 Body mass index (BMI) 34.0-34.9, adult: Secondary | ICD-10-CM

## 2024-01-07 DIAGNOSIS — O99352 Diseases of the nervous system complicating pregnancy, second trimester: Secondary | ICD-10-CM

## 2024-01-07 MED ORDER — ALBUTEROL SULFATE HFA 108 (90 BASE) MCG/ACT IN AERS
2.0000 | INHALATION_SPRAY | Freq: Four times a day (QID) | RESPIRATORY_TRACT | 2 refills | Status: AC | PRN
Start: 2024-01-07 — End: ?

## 2024-01-07 NOTE — Progress Notes (Addendum)
   PRENATAL VISIT NOTE  Subjective:  Jillian Bishop is a 28 y.o. [id number] at [redacted]w[redacted]d being seen today for ongoing prenatal care.  She is currently monitored for the following issues for this low-risk pregnancy and has Chronic migraine w/o aura w/o status migrainosus, not intractable; Family history of brain aneurysm; Asthma; Vitamin D deficiency; BMI 34.0-34.9,adult; Supervision of high risk pregnancy, antepartum; and Alpha thalassemia silent carrier on their problem list.  Patient reports back pain and shoulder pain. Back pain is so severe it affects her ability to sleep. No dysuria, fevers/chills, discharge, nausea/vomiting, bladder/bowel incontinence. Able to ambulate. Tried tylenol without relief.   Also notes intermittent shoulder/arm pain. Will get episodes of numbness/tingling or pain in the left arm that resolve spontaneously. No associated CP/SOB/palpitations. It is not associated with activity, rest or positional changes. Cannot ID any aggravating or alleviating factors.  Notes more breathlessness this pregnancy. Has asthma but does not have an inhaler.     Contractions: Not present. Vag. Bleeding: None.  Movement: Present. Denies leaking of fluid.   The following portions of the patient's history were reviewed and updated as appropriate: allergies, current medications, past family history, past medical history, past social history, past surgical history and problem list.   Objective:   Vitals:   01/07/24 1002  BP: 111/64  Pulse: 77  Weight: 199 lb 12.8 oz (90.6 kg)   Fetal Status: Fetal Heart Rate (bpm): 159   Movement: Present     General:  Alert, oriented and cooperative. Patient is in no acute distress.  Skin: Skin is warm and dry. No rash noted.   Cardiovascular: Normal heart rate noted  Respiratory: Normal respiratory effort, no problems with respiration noted  Abdomen: Soft, gravid, appropriate for gestational age.  Pain/Pressure: Present     Back: +mild lumbar paraspinal  tenderness. No spinal or CVA tenderness. No deformity. Normal gait.  Extremities: Normal ROM in left arm. Normal & equal sensation in upper extremities, normal strength bilaterally.   Assessment and Plan:  Pregnancy: [id number] at [redacted]w[redacted]d 1. Supervision of high risk pregnancy, antepartum (Primary) 2. [redacted] weeks gestation of pregnancy 3. Back pain in pregnancy Discussed option for AFP next visit Reviewed strategies to help with MSK pain in pregnancy including tylenol and topical agents like Tiger balm, icy hot, etc. PT referral also placed.   3. Alpha thalassemia silent carrier Discussed role for partner testing, patient considering  5. BMI 34.0-34.9,adult Early A1c 5.2  6. Chronic migraine w/o aura w/o status migrainosus, not intractable 7. Family history of brain aneurysm Normal brain MRI 01/18/22  8. Mild asthma without complication, unspecified whether persistent Rx for albuterol sent. If using >2x/wk may need to consider ICS  Please refer to After Visit Summary for other counseling recommendations.   Future Appointments  Date Time Provider Department Center  01/21/2024  8:35 AM Ralene Muskrat, PA-C [facility] None  02/02/2024  1:15 PM [facility] NURSE [facility]  02/02/2024  1:30 PM [facility] US[facility]    Lennart Pall, MD

## 2024-01-10 ENCOUNTER — Ambulatory Visit
Payer: BC Managed Care – PPO | Attending: Obstetrics and Gynecology | Admitting: Rehabilitative and Restorative Service Providers"

## 2024-01-10 ENCOUNTER — Telehealth: Payer: Self-pay | Admitting: Rehabilitative and Restorative Service Providers"

## 2024-01-10 ENCOUNTER — Other Ambulatory Visit: Payer: Self-pay

## 2024-01-10 NOTE — Progress Notes (Unsigned)
 Pt requesting refill

## 2024-01-10 NOTE — Telephone Encounter (Signed)
Called patient secondary to missed PT evaluation.  Left a message to notify patient to please call back the office if she would like to reschedule.

## 2024-01-11 ENCOUNTER — Encounter: Payer: Self-pay | Admitting: Obstetrics & Gynecology

## 2024-01-21 ENCOUNTER — Encounter: Payer: Self-pay | Admitting: Physician Assistant

## 2024-01-21 ENCOUNTER — Ambulatory Visit (INDEPENDENT_AMBULATORY_CARE_PROVIDER_SITE_OTHER): Payer: BC Managed Care – PPO | Admitting: Physician Assistant

## 2024-01-21 VITALS — BP 119/59 | HR 87 | Wt 203.0 lb

## 2024-01-21 DIAGNOSIS — Z3402 Encounter for supervision of normal first pregnancy, second trimester: Secondary | ICD-10-CM | POA: Diagnosis not present

## 2024-01-21 DIAGNOSIS — Z3492 Encounter for supervision of normal pregnancy, unspecified, second trimester: Secondary | ICD-10-CM | POA: Diagnosis not present

## 2024-01-21 DIAGNOSIS — O99891 Other specified diseases and conditions complicating pregnancy: Secondary | ICD-10-CM

## 2024-01-21 DIAGNOSIS — Z8249 Family history of ischemic heart disease and other diseases of the circulatory system: Secondary | ICD-10-CM

## 2024-01-21 DIAGNOSIS — Z3A17 17 weeks gestation of pregnancy: Secondary | ICD-10-CM | POA: Diagnosis not present

## 2024-01-21 DIAGNOSIS — G43709 Chronic migraine without aura, not intractable, without status migrainosus: Secondary | ICD-10-CM

## 2024-01-21 DIAGNOSIS — J45909 Unspecified asthma, uncomplicated: Secondary | ICD-10-CM

## 2024-01-21 DIAGNOSIS — D563 Thalassemia minor: Secondary | ICD-10-CM

## 2024-01-21 DIAGNOSIS — M549 Dorsalgia, unspecified: Secondary | ICD-10-CM

## 2024-01-21 NOTE — Progress Notes (Signed)
Pt. Presents for rob. pt is having back pain

## 2024-01-21 NOTE — Progress Notes (Signed)
   PRENATAL VISIT NOTE  Subjective:  Jillian Bishop is a 28 y.o. [id number] at [redacted]w[redacted]d being seen today for ongoing prenatal care.  She is currently monitored for the following issues for this low-risk pregnancy and has Chronic migraine w/o aura w/o status migrainosus, not intractable; Family history of brain aneurysm; Asthma; Vitamin D deficiency; BMI 34.0-34.9,adult; Supervision of high risk pregnancy, antepartum; and Alpha thalassemia silent carrier on their problem list.  Patient reports no complaints.  Contractions: Not present. Vag. Bleeding: None.  Movement: Present. Denies leaking of fluid, vaginal bleeding, contractions. Endorses fetal movement.   The following portions of the patient's history were reviewed and updated as appropriate: allergies, current medications, past family history, past medical history, past social history, past surgical history and problem list.   Objective:   Vitals:   01/21/24 0846  BP: (!) 119/59  Pulse: 87  Weight: 203 lb (92.1 kg)    Fetal Status: Fetal Heart Rate (bpm): 150   Movement: Present     General:  Alert, oriented and cooperative. Patient is in no acute distress.  Skin: Skin is warm and dry. No rash noted.   Cardiovascular: Normal heart rate noted  Respiratory: Normal respiratory effort, no problems with respiration noted  Abdomen: Soft, gravid, appropriate for gestational age.  Pain/Pressure: Present     Pelvic: Cervical exam deferred        Extremities: Normal range of motion.  Edema: None  Mental Status: Normal mood and affect. Normal behavior. Normal judgment and thought content.   Assessment and Plan:  Pregnancy: [id number] at [redacted]w[redacted]d 1. Encounter for supervision of normal first pregnancy in second trimester (Primary) Patient doing well, feeling regular fetal movement  BP, FHR appropriate  2. [redacted] weeks gestation of pregnancy Anticipatory guidance about next visits/weeks of pregnancy given.  - AFP, Serum, Open Spina Bifida  3. Back pain  in pregnancy Stable; Awaiting arrangement of appt with PT.   4. Alpha thalassemia silent carrier Pt advised to reschedule with genetic counseling if partner testing desired.   5. Mild asthma without complication, unspecified whether persistent Stable; Rx for albuterol sent last visit; patient has not had to use in interim.    6. Chronic migraine w/o aura w/o status migrainosus, not intractable 7. Family history of brain aneurysm Brain MRI 01/18/22 normal  Preterm labor symptoms and general obstetric precautions including but not limited to vaginal bleeding, contractions, leaking of fluid and fetal movement were reviewed in detail with the patient.  Please refer to After Visit Summary for other counseling recommendations.   Return in about 4 weeks (around 02/18/2024) for LOB.  Future Appointments  Date Time Provider Department Center  02/02/2024  1:15 PM [facility] NURSE [facility]  02/02/2024  1:30 PM [facility]  02/18/2024  8:35 AM Gerrit Heck, CNM [facility] None    Ralene Muskrat, [place]

## 2024-01-23 ENCOUNTER — Encounter: Payer: Self-pay | Admitting: Physician Assistant

## 2024-01-23 LAB — AFP, SERUM, OPEN SPINA BIFIDA
AFP MoM: 1.47
AFP Value: 50.6 ng/mL
Gest. Age on Collection Date: 17 wk
Maternal Age At EDD: 30.3 a
OSBR Risk 1 IN: 5931
Test Results:: NEGATIVE
Weight: 203 [lb_av]

## 2024-01-30 ENCOUNTER — Encounter: Payer: Self-pay | Admitting: Physician Assistant

## 2024-01-31 ENCOUNTER — Ambulatory Visit: Payer: BC Managed Care – PPO

## 2024-01-31 ENCOUNTER — Other Ambulatory Visit: Payer: Self-pay | Admitting: Family

## 2024-01-31 DIAGNOSIS — Z111 Encounter for screening for respiratory tuberculosis: Secondary | ICD-10-CM

## 2024-01-31 DIAGNOSIS — O99210 Obesity complicating pregnancy, unspecified trimester: Secondary | ICD-10-CM | POA: Insufficient documentation

## 2024-02-02 ENCOUNTER — Ambulatory Visit: Payer: BC Managed Care – PPO

## 2024-02-02 ENCOUNTER — Other Ambulatory Visit: Payer: Self-pay

## 2024-02-02 ENCOUNTER — Ambulatory Visit (HOSPITAL_BASED_OUTPATIENT_CLINIC_OR_DEPARTMENT_OTHER): Payer: BC Managed Care – PPO | Admitting: Maternal & Fetal Medicine

## 2024-02-02 ENCOUNTER — Ambulatory Visit: Payer: BC Managed Care – PPO | Attending: Obstetrics & Gynecology

## 2024-02-02 ENCOUNTER — Encounter: Payer: Self-pay | Admitting: Family

## 2024-02-02 VITALS — BP 110/53 | HR 92

## 2024-02-02 DIAGNOSIS — O99210 Obesity complicating pregnancy, unspecified trimester: Secondary | ICD-10-CM

## 2024-02-02 DIAGNOSIS — O99212 Obesity complicating pregnancy, second trimester: Secondary | ICD-10-CM

## 2024-02-02 DIAGNOSIS — E669 Obesity, unspecified: Secondary | ICD-10-CM | POA: Diagnosis not present

## 2024-02-02 DIAGNOSIS — O0990 Supervision of high risk pregnancy, unspecified, unspecified trimester: Secondary | ICD-10-CM | POA: Insufficient documentation

## 2024-02-02 DIAGNOSIS — O99012 Anemia complicating pregnancy, second trimester: Secondary | ICD-10-CM

## 2024-02-02 DIAGNOSIS — D563 Thalassemia minor: Secondary | ICD-10-CM | POA: Diagnosis not present

## 2024-02-02 DIAGNOSIS — Z3A19 19 weeks gestation of pregnancy: Secondary | ICD-10-CM | POA: Diagnosis not present

## 2024-02-02 NOTE — Progress Notes (Signed)
 Patient information  Patient Name: JEANITA CARNEIRO  Patient MRN:   [id number]  Referring practice: MFM Referring Provider: [facility])  MFM CONSULT  RENISE GILLIES is a 28 y.o. [id number] at [redacted]w[redacted]d here for ultrasound and consultation. Patient Active Problem List   Diagnosis Date Noted   Obesity affecting pregnancy 01/31/2024   Alpha thalassemia silent carrier 12/16/2023   Supervision of high risk pregnancy, antepartum 11/10/2023   BMI 34.0-34.9,adult 06/02/2023   Vitamin D deficiency 06/19/2022   Asthma 06/18/2022   Chronic migraine w/o aura w/o status migrainosus, not intractable 12/23/2021   Family history of brain aneurysm 12/23/2021    Dura M Cullinane is doing well today with no acute concerns.    RE obesity in pregnancy: Discussed the potential complications of obesity in pregnancy and the need for serial growth ultrasounds as well as limiting weight gain to less than 20 pounds during the pregnancy.  Due to elevated risk of gestational hypertension and preeclampsia 81 mg of aspirin is recommended during this pregnancy.  Sonographic findings Single intrauterine pregnancy at 19w 4d  Fetal cardiac activity:  Observed and appears normal. Presentation: Breech. The anatomic structures that were well seen appear normal without evidence of soft markers. Due to poor acoustic windows some structures remain suboptimally visualized. Fetal biometry shows the estimated fetal weight at the 51 percentile.  Amniotic fluid: Within normal limits.  MVP: 5.06 cm. Placenta: Anterior Fundal. Adnexa: No adnexal mass visualized. Cervical length: 3.2 cm.  There are limitations of prenatal ultrasound such as the inability to detect certain abnormalities due to poor visualization. Various factors such as fetal position, gestational age and maternal body habitus may increase the difficulty in visualizing the fetal anatomy.    Recommendations -EDD should be 06/24/2024 based on   LMP  (09/18/23). -Follow up ultrasound in 4-6 weeks to attempt visualization of the anatomy not seen and reassess the fetal growth -Baseline preeclampsia labs: CMP, CBC and urine protein/creatinine ratio if not previously completed.  -Start Aspirin 81 mg for preeclampsia prophylaxis -Follow-up anatomy and fetal growth in 4 to 6 weeks -Serial growth ultrasounds starting around 28 weeks to monitor for fetal growth restriction -Delivery timing pending clinical course  -Continue routine prenatal care with referring OB provider  Review of Systems: A review of systems was performed and was negative except per HPI   Vitals and Physical Exam    02/02/2024    1:08 PM 01/21/2024    8:46 AM 01/07/2024   10:02 AM  Vitals with BMI  Weight  203 lbs 199 lbs 13 oz  Systolic 110 119 308  Diastolic 53 59 64  Pulse 92 87 77    Sitting comfortably on the sonogram table Nonlabored breathing Normal rate and rhythm Abdomen is nontender  Past pregnancies OB History  Gravida Para Term Preterm AB Living  7 4 4  2 4   SAB IAB Ectopic Multiple Live Births  2   0 4    # Outcome Date GA Lbr Len/2nd Weight Sex Type Anes PTL Lv  7 Current           6 SAB 03/30/23          5 Term 03/11/20 [redacted]w[redacted]d 02:32 / 00:14 6 lb 15.8 oz (3.17 kg) F Vag-Spont None  LIV  4 SAB 11/09/18 [redacted]w[redacted]d   U      3 Term 04/23/17 [redacted]w[redacted]d 03:22 / 00:22 6 lb 8 oz (2.948 kg) F Vag-Spont None  LIV  2 Term 01/20/16 [redacted]w[redacted]d  7 lb 0.1 oz (3.177 kg) M Vag-Spont None  LIV  1 Term 10/03/10 [redacted]w[redacted]d  5 lb 4 oz (2.381 kg) M Vag-Spont None N LIV     I spent 30 minutes reviewing the patients chart, including labs and images as well as counseling the patient about her medical conditions. Greater than 50% of the time was spent in direct face-to-face patient counseling.  Braxton Feathers, DO Maternal fetal medicine, [place]   02/02/2024  2:09 PM

## 2024-02-03 ENCOUNTER — Encounter: Payer: Self-pay | Admitting: Family

## 2024-02-03 LAB — QUANTIFERON-TB GOLD PLUS
QuantiFERON Mitogen Value: >10 [IU]/mL
QuantiFERON Nil Value: 0 [IU]/mL
QuantiFERON TB1 Ag Value: 0 [IU]/mL
QuantiFERON TB2 Ag Value: 0 [IU]/mL
QuantiFERON-TB Gold Plus: NEGATIVE

## 2024-02-04 ENCOUNTER — Other Ambulatory Visit: Payer: Self-pay | Admitting: *Deleted

## 2024-02-04 DIAGNOSIS — D563 Thalassemia minor: Secondary | ICD-10-CM

## 2024-02-04 DIAGNOSIS — O99212 Obesity complicating pregnancy, second trimester: Secondary | ICD-10-CM

## 2024-02-08 ENCOUNTER — Encounter: Payer: Self-pay | Admitting: Obstetrics & Gynecology

## 2024-02-18 ENCOUNTER — Ambulatory Visit (INDEPENDENT_AMBULATORY_CARE_PROVIDER_SITE_OTHER): Payer: BC Managed Care – PPO

## 2024-02-18 VITALS — BP 130/59 | HR 87 | Wt 204.4 lb

## 2024-02-18 DIAGNOSIS — O99210 Obesity complicating pregnancy, unspecified trimester: Secondary | ICD-10-CM | POA: Diagnosis not present

## 2024-02-18 DIAGNOSIS — Z3480 Encounter for supervision of other normal pregnancy, unspecified trimester: Secondary | ICD-10-CM

## 2024-02-18 DIAGNOSIS — Z3A21 21 weeks gestation of pregnancy: Secondary | ICD-10-CM | POA: Diagnosis not present

## 2024-02-18 MED ORDER — ASPIRIN 81 MG PO TBEC: 81.0000 mg | DELAYED_RELEASE_TABLET | Freq: Every day | ORAL | 2 refills | Status: DC

## 2024-02-18 NOTE — Progress Notes (Signed)
   HIGH-RISK PREGNANCY OFFICE VISIT  Patient name: Jillian Bishop MRN [id number]  Date of birth: 05/28/1996 Chief Complaint:   Routine Prenatal Visit  Subjective:   Jillian Bishop is a 28 y.o. W2N5621 female at [redacted]w[redacted]d with an Estimated Date of Delivery: 06/24/24 being seen today for ongoing management of a high-risk pregnancy aeb has Chronic migraine w/o aura w/o status migrainosus, not intractable; Family history of brain aneurysm; Asthma; Vitamin D deficiency; BMI 34.0-34.9,adult; Supervision of high risk pregnancy, antepartum; Alpha thalassemia silent carrier; and Obesity affecting pregnancy on their problem list.  Patient presents today, alone, with some  fatigue and pelvic pressure .  Patient states she has noted decreased energy despite walking daily.  Patient endorses fetal movement. Patient reports some abdominal cramping, but denies contractions.  However patient states pelvic pressure and cramping "is nothing major."  Patient denies vaginal concerns including abnormal discharge, leaking of fluid, and bleeding. No issues with urination, constipation, or diarrhea.    Contractions: Not present. Vag. Bleeding: None.  Movement: Present.  Reviewed past medical,surgical, social, obstetrical and family history as well as problem list, medications and allergies.  Objective   Vitals:   02/18/24 0844  BP: (!) 130/59  Pulse: 87  Weight: 204 lb 6.4 oz (92.7 kg)  Body mass index is 36.21 kg/m.  Total Weight Gain:11 lb 6.4 oz (5.171 kg)         Physical Examination:   General appearance: Well appearing, and in no distress  Mental status: Alert, oriented to person, place, and time  Skin: Warm & dry  Cardiovascular: Normal heart rate noted  Respiratory: Normal respiratory effort, no distress  Abdomen: Soft, gravid, nontender, AGA with Fundus +3/U   Pelvic: Cervical exam deferred           Extremities: Edema: None  Fetal Status: Fetal Heart Rate (bpm): 150  Movement: Present   No results  found for this or any previous visit (from the past 24 hours).  Assessment & Plan:  Low-risk pregnancy of a 28 y.o., [id number] at [redacted]w[redacted]d with an Estimated Date of Delivery: 06/24/24   1. Supervision of low risk pregnancy, antepartum -Anticipatory guidance for upcoming appts. -Patient to schedule next appt in 4-5 weeks for an in-person or virtual visit.  2. Obesity affecting pregnancy, antepartum, unspecified obesity type -TWG 11lbs. -Not taking bASA. Rx sent to pharmacy for risks factors associated with PreE.  -Plan for baseline PreE labs with GTT labs.  -Scheduled for growth [place] 03/08/2024.  3. [redacted] weeks gestation of pregnancy -Doing well. -Discussed c/o decreased energy. -Encouraged increased activity that cause some sweating and brief elevations in HR.  Information for exercise in pregnancy placed in AVS.       Meds: No orders of the defined types were placed in this encounter.  Labs/procedures today:  Lab Orders  No laboratory test(s) ordered today     Reviewed: Preterm labor symptoms and general obstetric precautions including but not limited to vaginal bleeding, contractions, leaking of fluid and fetal movement were reviewed in detail with the patient.  All questions were answered.  Follow-up: No follow-ups on file.  No orders of the defined types were placed in this encounter.  Cherre Robins MSN, CNM 02/18/2024

## 2024-02-18 NOTE — Progress Notes (Signed)
 Pt presents for ROB visit. No concerns

## 2024-02-22 ENCOUNTER — Encounter: Payer: Self-pay | Admitting: Obstetrics & Gynecology

## 2024-02-23 ENCOUNTER — Telehealth: Payer: Self-pay

## 2024-02-23 NOTE — Telephone Encounter (Signed)
 Attempted to contact related to mychart message pt sent about cramping, no answer, left vm

## 2024-02-23 NOTE — Telephone Encounter (Signed)
 Returned call, pt states that she is feeling around 2 contractions per hour, denies bleeding or leaking fluid. Pt reports good fetal movement. Pt stated that she is working from home, advised to rest, drink plenty of fluids, monitor for changes/abnormal symptoms and be evaluated at the hospital if they occur, pt agreed.

## 2024-03-08 ENCOUNTER — Ambulatory Visit (HOSPITAL_BASED_OUTPATIENT_CLINIC_OR_DEPARTMENT_OTHER)

## 2024-03-08 ENCOUNTER — Ambulatory Visit: Payer: BC Managed Care – PPO | Attending: Maternal & Fetal Medicine

## 2024-03-08 DIAGNOSIS — Z3A24 24 weeks gestation of pregnancy: Secondary | ICD-10-CM | POA: Insufficient documentation

## 2024-03-08 DIAGNOSIS — Z6834 Body mass index (BMI) 34.0-34.9, adult: Secondary | ICD-10-CM

## 2024-03-08 DIAGNOSIS — Z8249 Family history of ischemic heart disease and other diseases of the circulatory system: Secondary | ICD-10-CM

## 2024-03-08 DIAGNOSIS — O99012 Anemia complicating pregnancy, second trimester: Secondary | ICD-10-CM

## 2024-03-08 DIAGNOSIS — O99210 Obesity complicating pregnancy, unspecified trimester: Secondary | ICD-10-CM | POA: Insufficient documentation

## 2024-03-08 DIAGNOSIS — D563 Thalassemia minor: Secondary | ICD-10-CM | POA: Diagnosis not present

## 2024-03-08 DIAGNOSIS — E669 Obesity, unspecified: Secondary | ICD-10-CM

## 2024-03-08 DIAGNOSIS — O99212 Obesity complicating pregnancy, second trimester: Secondary | ICD-10-CM | POA: Diagnosis not present

## 2024-03-08 NOTE — Progress Notes (Signed)
 Patient information  Patient Name: Jillian Bishop  Patient MRN:   [id number]  Referring practice: MFM Referring Provider: [place] [facility]  MFM CONSULT  Jillian Bishop is a 28 y.o. [id number] at [redacted]w[redacted]d here for ultrasound and consultation. Patient Active Problem List   Diagnosis Date Noted   Obesity affecting pregnancy 01/31/2024   Alpha thalassemia silent carrier 12/16/2023   Supervision of other normal pregnancy, antepartum 11/10/2023   BMI 34.0-34.9,adult 06/02/2023   Vitamin D deficiency 06/19/2022   Asthma 06/18/2022   Chronic migraine w/o aura w/o status migrainosus, not intractable 12/23/2021   Family history of brain aneurysm 12/23/2021    Jillian Bishop has a pregnancy with the complications mentioned in the problem list. During today's visit we focused on the following concerns:   RE elevated BMI: I discussed the ultrasound findings with the patient which shows normal fetal growth I discussed the importance of limiting weight gain to 11 to 20 pounds exercise such as walking daily.  Since her pregravid BMI is less than 35 she does not need future ultrasounds unless there is difficulty measuring the fundal height.   The patient also has a family history of multiple brain aneurysms in her dad and her sister who unfortunately died at young age.  The patient reports that she had a CT angiogram of her brain and it was normal.  Sonographic findings Single intrauterine pregnancy at 24w 4d.  Fetal cardiac activity:  Observed and appears normal. Presentation: Breech. Interval fetal anatomy appears normal. Fetal biometry shows the estimated fetal weight at the 61 percentile. Amniotic fluid volume: Within normal limits. MVP: 6.67 cm. Placenta: Anterior.  Recommendations  - No further ultrasounds are recommended at this time based on the current indications. If future indications arise (e.g. size/date discrepancy on fundal height, gestational diabetes or hypertension) and an  ultrasound is to be desired at our MFM office, please send a referral.  -Referral made to medical genetics due to family history of aneurysms   Review of Systems: A review of systems was performed and was negative except per HPI   Vitals and Physical Exam    03/08/2024   11:03 AM 02/18/2024    8:44 AM 02/02/2024    1:08 PM  Vitals with BMI  Weight  204 lbs 6 oz   Systolic 120 130 518  Diastolic 50 59 53  Pulse 89 87 92   Sitting comfortably on the sonogram table Nonlabored breathing Normal rate and rhythm Abdomen is nontender  Past pregnancies OB History  Gravida Para Term Preterm AB Living  7 4 4  2 4   SAB IAB Ectopic Multiple Live Births  2   0 4    # Outcome Date GA Lbr Len/2nd Weight Sex Type Anes PTL Lv  7 Current           6 SAB 03/30/23          5 Term 03/11/20 [redacted]w[redacted]d 02:32 / 00:14 6 lb 15.8 oz (3.17 kg) F Vag-Spont None  LIV  4 SAB 11/09/18 [redacted]w[redacted]d   U      3 Term 04/23/17 [redacted]w[redacted]d 03:22 / 00:22 6 lb 8 oz (2.948 kg) F Vag-Spont None  LIV  2 Term 01/20/16 [redacted]w[redacted]d  7 lb 0.1 oz (3.177 kg) M Vag-Spont None  LIV  1 Term 10/03/10 [redacted]w[redacted]d  5 lb 4 oz (2.381 kg) M Vag-Spont None N LIV     I spent 20 minutes reviewing the patients chart, including labs and  images as well as counseling the patient about her medical conditions. Greater than 50% of the time was spent in direct face-to-face patient counseling.  Braxton Feathers, DO Maternal fetal medicine, [place]   03/08/2024  4:15 PM

## 2024-03-09 ENCOUNTER — Ambulatory Visit: Admitting: Certified Nurse Midwife

## 2024-03-09 VITALS — BP 126/75 | HR 93 | Wt 204.2 lb

## 2024-03-09 DIAGNOSIS — Z3402 Encounter for supervision of normal first pregnancy, second trimester: Secondary | ICD-10-CM | POA: Diagnosis not present

## 2024-03-09 DIAGNOSIS — G43709 Chronic migraine without aura, not intractable, without status migrainosus: Secondary | ICD-10-CM

## 2024-03-09 DIAGNOSIS — R002 Palpitations: Secondary | ICD-10-CM | POA: Diagnosis not present

## 2024-03-09 DIAGNOSIS — Z3A24 24 weeks gestation of pregnancy: Secondary | ICD-10-CM

## 2024-03-09 MED ORDER — SUMATRIPTAN SUCCINATE 100 MG PO TABS: 100.0000 mg | ORAL_TABLET | Freq: Once | ORAL | 11 refills | Status: AC | PRN

## 2024-03-09 NOTE — Progress Notes (Unsigned)
 Pt presents for rob. Pt states that she is experiencing shortness of breath even while just lying down.

## 2024-03-09 NOTE — Addendum Note (Signed)
 Addended by: Lovina Reach on: 03/09/2024 08:32 AM   Modules accepted: Orders

## 2024-03-09 NOTE — Progress Notes (Signed)
   PRENATAL VISIT NOTE  Subjective:  Jillian Bishop is a 28 y.o. [id number] at [redacted]w[redacted]d being seen today for ongoing prenatal care.  She is currently monitored for the following issues for this low-risk pregnancy and has Chronic migraine w/o aura w/o status migrainosus, not intractable; Family history of brain aneurysm; Asthma; Vitamin D deficiency; BMI 34.0-34.9,adult; Supervision of other normal pregnancy, antepartum; Alpha thalassemia silent carrier; and Obesity affecting pregnancy on their problem list.  Patient reports palpitations that make her feel short of breath. She reports feeling this way since the beginning of pregnancy. Inhaler helps some, but she is still having trouble breathing. She also reports that her chronic headaches have returned and Excedrin does not help. Contractions: Irritability. Vag. Bleeding: None.  Movement: Present. Denies leaking of fluid.   The following portions of the patient's history were reviewed and updated as appropriate: allergies, current medications, past family history, past medical history, past social history, past surgical history and problem list.   Objective:   Vitals:   03/09/24 1510  BP: 126/75  Pulse: 93  Weight: 92.6 kg    Fetal Status: Fetal Heart Rate (bpm): 152 Fundal Height: 26 cm Movement: Present     General:  Alert, oriented and cooperative. Patient is in no acute distress.  Skin: Skin is warm and dry. No rash noted.   Cardiovascular: Normal heart rate noted  Respiratory: Normal respiratory effort, no problems with respiration noted  Abdomen: Soft, gravid, appropriate for gestational age.  Pain/Pressure: Present     Pelvic: Cervical exam deferred        Extremities: Normal range of motion.  Edema: None  Mental Status: Normal mood and affect. Normal behavior. Normal judgment and thought content.   Assessment and Plan:  Pregnancy: [id number] at [redacted]w[redacted]d 1. Encounter for supervision of low-risk first pregnancy in second trimester  (Primary) - Reports overall doing well in pregnancy  2. [redacted] weeks gestation of pregnancy - Routine OB Care  3. Chronic migraine w/o aura w/o status migrainosus, not intractable - Imitrex has worked for her migraines in the past, will re prescribe.  4. Palpitations - physical exam WNL  - Referral to [facility].   Preterm labor symptoms and general obstetric precautions including but not limited to vaginal bleeding, contractions, leaking of fluid and fetal movement were reviewed in detail with the patient. Please refer to After Visit Summary for other counseling recommendations.   No follow-ups on file.  Future Appointments  Date Time Provider Department Center  04/06/2024  9:15 AM [facility] None  04/06/2024  9:35 AM Lennart Pall, MD [facility] None    [place], [place]

## 2024-03-13 ENCOUNTER — Encounter (HOSPITAL_COMMUNITY): Payer: Self-pay | Admitting: Obstetrics and Gynecology

## 2024-03-13 ENCOUNTER — Inpatient Hospital Stay (HOSPITAL_COMMUNITY)
Admission: AD | Admit: 2024-03-13 | Discharge: 2024-03-13 | Disposition: A | Discharge status: Home / Self Care | Payer: BC Managed Care – PPO | Attending: Obstetrics and Gynecology | Admitting: Obstetrics and Gynecology

## 2024-03-13 DIAGNOSIS — O99612 Diseases of the digestive system complicating pregnancy, second trimester: Secondary | ICD-10-CM | POA: Insufficient documentation

## 2024-03-13 DIAGNOSIS — O99512 Diseases of the respiratory system complicating pregnancy, second trimester: Secondary | ICD-10-CM | POA: Diagnosis not present

## 2024-03-13 DIAGNOSIS — O99892 Other specified diseases and conditions complicating childbirth: Secondary | ICD-10-CM | POA: Diagnosis not present

## 2024-03-13 DIAGNOSIS — Z79899 Other long term (current) drug therapy: Secondary | ICD-10-CM | POA: Insufficient documentation

## 2024-03-13 DIAGNOSIS — R079 Chest pain, unspecified: Secondary | ICD-10-CM | POA: Diagnosis not present

## 2024-03-13 DIAGNOSIS — J4530 Mild persistent asthma, uncomplicated: Secondary | ICD-10-CM | POA: Diagnosis not present

## 2024-03-13 DIAGNOSIS — Z3A25 25 weeks gestation of pregnancy: Secondary | ICD-10-CM | POA: Insufficient documentation

## 2024-03-13 DIAGNOSIS — K219 Gastro-esophageal reflux disease without esophagitis: Secondary | ICD-10-CM | POA: Insufficient documentation

## 2024-03-13 DIAGNOSIS — R0602 Shortness of breath: Secondary | ICD-10-CM | POA: Insufficient documentation

## 2024-03-13 LAB — CBC
HCT: 32.4 % — ABNORMAL LOW (ref 36.0–46.0)
Hemoglobin: 10.7 g/dL — ABNORMAL LOW (ref 12.0–15.0)
MCH: 27.4 pg (ref 26.0–34.0)
MCHC: 33 g/dL (ref 30.0–36.0)
MCV: 82.9 fL (ref 80.0–100.0)
Platelets: 366 10*3/uL (ref 150–400)
RBC: 3.91 MIL/uL (ref 3.87–5.11)
RDW: 14.5 % (ref 11.5–15.5)
WBC: 11.3 10*3/uL — ABNORMAL HIGH (ref 4.0–10.5)
nRBC: 0 % (ref 0.0–0.2)

## 2024-03-13 LAB — URINALYSIS, ROUTINE W REFLEX MICROSCOPIC
Bilirubin Urine: NEGATIVE
Glucose, UA: NEGATIVE mg/dL
Hgb urine dipstick: NEGATIVE
Ketones, ur: NEGATIVE mg/dL
Nitrite: NEGATIVE
Protein, ur: NEGATIVE mg/dL
Specific Gravity, Urine: 1.028 (ref 1.005–1.030)
pH: 6 (ref 5.0–8.0)

## 2024-03-13 LAB — COMPREHENSIVE METABOLIC PANEL WITH GFR
ALT: 15 U/L (ref 0–44)
AST: 19 U/L (ref 15–41)
Albumin: 2.5 g/dL — ABNORMAL LOW (ref 3.5–5.0)
Alkaline Phosphatase: 37 U/L — ABNORMAL LOW (ref 38–126)
Anion gap: 10 (ref 5–15)
BUN: 10 mg/dL (ref 6–20)
CO2: 21 mmol/L — ABNORMAL LOW (ref 22–32)
Calcium: 9.1 mg/dL (ref 8.9–10.3)
Chloride: 105 mmol/L (ref 98–111)
Creatinine, Ser: 0.68 mg/dL (ref 0.44–1.00)
GFR, Estimated: >60 mL/min (ref >60)
Glucose, Bld: 94 mg/dL (ref 70–99)
Potassium: 3.6 mmol/L (ref 3.5–5.1)
Sodium: 136 mmol/L (ref 135–145)
Total Bilirubin: 0.2 mg/dL (ref 0.0–1.2)
Total Protein: 5.8 g/dL — ABNORMAL LOW (ref 6.5–8.1)

## 2024-03-13 LAB — BRAIN NATRIURETIC PEPTIDE: B Natriuretic Peptide: 32.4 pg/mL (ref 0.0–100.0)

## 2024-03-13 MED ORDER — SUCRALFATE 1 GM/10ML PO SUSP
1.0000 g | Freq: Once | ORAL | Status: AC
  Administered 2024-03-13: 1 g via ORAL
  Filled 2024-03-13: qty 10

## 2024-03-13 MED ORDER — BUDESONIDE-FORMOTEROL FUMARATE 80-4.5 MCG/ACT IN AERO: 2.0000 | INHALATION_SPRAY | Freq: Two times a day (BID) | RESPIRATORY_TRACT | 0 refills | Status: DC

## 2024-03-13 NOTE — MAU Provider Note (Signed)
 Chief Complaint:  Shortness of Breath and Chest Pain   HPI   None     Jillian Bishop is a 28 y.o. [id number] at [redacted]w[redacted]d who presents to maternity admissions reporting chest pain and SOB. Onset was 1 month ago, OB is aware. She states the SOB feels different than when she experiences SOB with asthma. Denies wheeze, cough. She is using her albuterol rescue inhaler about 3 times every day. Chest pain has been constant for 2 weeks, occurs at rest and with exertion. The patient describes the pain as intermittent,  tightening  in nature, does not radiate. Associated symptoms are dyspnea. No identifiable aggravating or relieving factors.  She does not have a history of cardiac conditions, and she is not established with cardiology. She is prescribed famotidine for GERD and is currently taking it on a regular basis.  Pregnancy Course: Receives care at [facility] and is seeing MFM. Prenatal records reviewed.   Past Medical History:  Diagnosis Date   Asthma    mild well controlled, no inhaler use since 2016   Benign teratoma of ovary, right    COVID-19 11/02/2019   sore throat cough, sob loss of taste and smell, cough until 03-11-2020 all symptoms resolved now   Dermoid cyst    GERD (gastroesophageal reflux disease)    Headache    tension, occ migraine   HPV (human papilloma virus) infection    OB History  Gravida Para Term Preterm AB Living  7 4 4  2 4   SAB IAB Ectopic Multiple Live Births  2   0 4    # Outcome Date GA Lbr Len/2nd Weight Sex Type Anes PTL Lv  7 Current           6 SAB 03/30/23          5 Term 03/11/20 [redacted]w[redacted]d 02:32 / 00:14 3170 g F Vag-Spont None  LIV  4 SAB 11/09/18 [redacted]w[redacted]d   U      3 Term 04/23/17 [redacted]w[redacted]d 03:22 / 00:22 2948 g F Vag-Spont None  LIV  2 Term 01/20/16 [redacted]w[redacted]d  3177 g M Vag-Spont None  LIV  1 Term 10/03/10 [redacted]w[redacted]d  2381 g M Vag-Spont None N LIV   Past Surgical History:  Procedure Laterality Date   APPENDECTOMY  age 24 or 3   DILATION AND EVACUATION N/A 11/19/2018    Procedure: DILATATION AND EVACUATION;  Surgeon: Huel Cote, MD;  Location: [facility];  Service: Gynecology;  Laterality: N/A;   LAPAROSCOPIC OVARIAN CYSTECTOMY Right 09/05/2020   Procedure: LAPAROSCOPIC OVARIAN CYSTECTOMY LYSIS OF ADHESISONS;  Surgeon: Edwinna Areola, DO;  [facility] ;  Service: Gynecology;  Laterality: Right;   Family History  Problem Relation Age of Onset   Hypertension Mother    Diabetes Mother    Hypertension Father    Diabetes Father    Hypertension Sister    Hypertension Sister    Diabetes Sister    Diabetes Maternal Aunt    Diabetes Maternal Grandmother    Hypertension Maternal Grandmother    Heart disease Paternal Grandmother    Hypertension Paternal Grandmother    Social History   Tobacco Use   Smoking status: Never    Passive exposure: Never   Smokeless tobacco: Never  Vaping Use   Vaping status: Never Used  Substance Use Topics   Alcohol use: Not Currently   Drug use: No   No Known Allergies Medications Prior to Admission  Medication Sig Dispense Refill Last Dose/Taking  albuterol (VENTOLIN HFA) 108 (90 Base) MCG/ACT inhaler Inhale 2 puffs into the lungs every 6 (six) hours as needed for wheezing or shortness of breath. 8 g 2 03/13/2024   aspirin EC 81 MG tablet Take 1 tablet (81 mg total) by mouth daily. 60 tablet 2 03/13/2024 Morning   famotidine (PEPCID) 20 MG tablet Take 1 tablet (20 mg total) by mouth 2 (two) times daily. 60 tablet 3 Past Week   Prenatal Vit-Fe Fumarate-FA (PREPLUS) 27-1 MG TABS Take 1 tablet by mouth daily. 30 tablet 13 03/13/2024 Morning   SUMAtriptan (IMITREX) 100 MG tablet Take 1 tablet (100 mg total) by mouth once as needed for up to 1 dose for migraine. May repeat in 2 hours if headache persists or recurs. 9 tablet 11     I have reviewed patient's Past Medical Hx, Surgical Hx, Family Hx, Social Hx, medications and allergies.   ROS  Pertinent items noted in HPI and remainder of  comprehensive ROS otherwise negative.   PHYSICAL EXAM  Patient Vitals for the past 24 hrs:  BP Temp Temp src Pulse Resp SpO2 Height Weight  03/13/24 1728 133/67 97.9 F (36.6 C) Oral 99 18 100 % 5\' 3"  (1.6 m) 95.8 kg    Constitutional: Well-developed, well-nourished female in no acute distress.  Cardiovascular: normal rate & rhythm, warm and well-perfused Respiratory: normal effort, no problems with respiration noted. CTA bilaterally, no adventitious lung sounds. GI: Abd soft, non-tender, non-distended. Gravid. MSK: Extremities nontender, no edema, normal ROM Skin: warm and dry. Acyanotic, no jaundice or pallor. Neurologic: Alert and oriented x 4. No abnormal coordination. Psychiatric: Normal mood. Speech not slurred, not rapid/pressured. Patient is cooperative.    Labs: Results for orders placed or performed during the hospital encounter of 03/13/24 (from the past 24 hours)  Comprehensive metabolic panel     Status: Abnormal   Collection Time: 03/13/24  6:27 PM  Result Value Ref Range   Sodium 136 135 - 145 mmol/L   Potassium 3.6 3.5 - 5.1 mmol/L   Chloride 105 98 - 111 mmol/L   CO2 21 (L) 22 - 32 mmol/L   Glucose, Bld 94 70 - 99 mg/dL   BUN 10 6 - 20 mg/dL   Creatinine, Ser 0.98 0.44 - 1.00 mg/dL   Calcium 9.1 8.9 - 11.9 mg/dL   Total Protein 5.8 (L) 6.5 - 8.1 g/dL   Albumin 2.5 (L) 3.5 - 5.0 g/dL   AST 19 15 - 41 U/L   ALT 15 0 - 44 U/L   Alkaline Phosphatase 37 (L) 38 - 126 U/L   Total Bilirubin 0.2 0.0 - 1.2 mg/dL   GFR, Estimated >14 >78 mL/min   Anion gap 10 5 - 15  CBC     Status: Abnormal   Collection Time: 03/13/24  6:27 PM  Result Value Ref Range   WBC 11.3 (H) 4.0 - 10.5 K/uL   RBC 3.91 3.87 - 5.11 MIL/uL   Hemoglobin 10.7 (L) 12.0 - 15.0 g/dL   HCT 29.5 (L) 62.1 - 30.8 %   MCV 82.9 80.0 - 100.0 fL   MCH 27.4 26.0 - 34.0 pg   MCHC 33.0 30.0 - 36.0 g/dL   RDW 65.7 84.6 - 96.2 %   Platelets 366 150 - 400 K/uL   nRBC 0.0 0.0 - 0.2 %  Brain natriuretic  peptide     Status: None   Collection Time: 03/13/24  6:27 PM  Result Value Ref Range   B Natriuretic Peptide  32.4 0.0 - 100.0 pg/mL  Urinalysis, Routine w reflex microscopic -Urine, Clean Catch     Status: Abnormal   Collection Time: 03/13/24  6:40 PM  Result Value Ref Range   Color, Urine YELLOW YELLOW   APPearance CLOUDY (A) CLEAR   Specific Gravity, Urine 1.028 1.005 - 1.030   pH 6.0 5.0 - 8.0   Glucose, UA NEGATIVE NEGATIVE mg/dL   Hgb urine dipstick NEGATIVE NEGATIVE   Bilirubin Urine NEGATIVE NEGATIVE   Ketones, ur NEGATIVE NEGATIVE mg/dL   Protein, ur NEGATIVE NEGATIVE mg/dL   Nitrite NEGATIVE NEGATIVE   Leukocytes,Ua LARGE (A) NEGATIVE   RBC / HPF 0-5 0 - 5 RBC/hpf   WBC, UA 6-10 0 - 5 WBC/hpf   Bacteria, UA RARE (A) NONE SEEN   Squamous Epithelial / HPF 11-20 0 - 5 /HPF   Mucus PRESENT     Imaging:  No results found.  EKG: EKG: normal EKG, normal sinus rhythm, unchanged from previous tracings. I personally reviewed and interpreted this tracing.  MDM & MAU COURSE  MDM: High  MAU Course: -Labs to rule out severe/worsening anemia, heart failure, electrolyte abnormalities, gallbladder disease. -EKG with NSR, no arrhythmia. No need to order troponin at this time. -Trial sucralfate as she does have preexisting GERD which may be contributing to or exacerbating chest tightness. -CMP, CBC, BNP unremarkable with the exception of Hemoglobin now 10.7.  -Start asthma maintenance step up per GINA guidelines with Symbicort, Step 3. Continue albuterol rescue inhaler as needed.  Differential diagnosis considered for shortness of breath includes but is not limited to: physiologic SOB of pregnancy, URI, asthma, anemia, valvular heart disease, CAD, anxiety/panic disorder, ACS, heart failure exacerbation, pulmonary embolism.  Orders Placed This Encounter  Procedures   Urinalysis, Routine w reflex microscopic -Urine, Clean Catch   Comprehensive metabolic panel   CBC   Brain  natriuretic peptide   ED EKG   Discharge patient   Meds ordered this encounter  Medications   sucralfate (CARAFATE) 1 GM/10ML suspension 1 g   budesonide-formoterol (SYMBICORT) 80-4.5 MCG/ACT inhaler    Sig: Inhale 2 puffs into the lungs 2 (two) times daily.    Dispense:  1 each    Refill:  0    ASSESSMENT   1. Chest pain during pregnancy   2. Shortness of breath   3. Mild persistent asthma without complication   4. [redacted] weeks gestation of pregnancy     PLAN  Discharge home in stable condition with return precautions.  Add iron supplement for low hemoglobin.    Follow-up Information     Rema Fendt, NP Follow up.   Specialty: Nurse Practitioner Why: follow up for asthma Contact information: [address] [place] [address] [phone]         [facility] Follow up.   Specialty: Obstetrics and Gynecology Why: As scheduled for ongoing prenatal care Contact information: [address] [place] [address] [phone]                Allergies as of 03/13/2024   No Known Allergies      Medication List     TAKE these medications    albuterol 108 (90 Base) MCG/ACT inhaler Commonly known as: VENTOLIN HFA Inhale 2 puffs into the lungs every 6 (six) hours as needed for wheezing or shortness of breath.   aspirin EC 81 MG tablet Take 1 tablet (81 mg total) by  mouth daily.   budesonide-formoterol 80-4.5 MCG/ACT inhaler Commonly known as: Symbicort Inhale 2 puffs into the lungs 2 (two) times daily.   famotidine 20 MG tablet Commonly known as: PEPCID Take 1 tablet (20 mg total) by mouth 2 (two) times daily.   PrePLUS 27-1 MG Tabs Take 1 tablet by mouth daily.   SUMAtriptan 100 MG tablet Commonly known as: IMITREX Take 1 tablet (100 mg total) by mouth once as needed for up to 1 dose for migraine. May repeat in 2 hours if headache persists or recurs.         Melida Quitter, PA

## 2024-03-13 NOTE — Discharge Instructions (Addendum)
 LOW HEMOGLOBIN:  I would recommend you try a supplement called Blood Builder in addition to your prenatal vitamin. It is available via [employer] and also at stores like [employer]. It uses plant based iron and is less prone to causing stomach and bowel upset. Here is a screen shot of the supplement.   Alternatively, you can start a regular iron supplement in addition to your prenatal vitamin. Either ferrous sulfate, ferrous gluconate, or ferrous fumarate. The recommendation is 100 mg every other day. These can all be found over-the-counter.  For some people this can be constipating, so you can also add a daily stool softener (Miralax) if needed.  About 10% of people will have difficulty with iron and it will cause GI upset.  Taking a liquid formulation with food can help cause less GI symptoms.  ASTHMA: Start the new inhaler once every day for maintenance. You can still use your albuterol inhaler for rescue. Follow up for further refills with your PCP or OB.

## 2024-03-13 NOTE — MAU Note (Signed)
.  Jillian Bishop is a 28 y.o. at [redacted]w[redacted]d here in MAU reporting: Chest pain and SOB. She reports she has been experiencing shortness of breath since March and has made her OB aware. She reports they keep telling her to use her inhaler but she reports it feels different than the SOB associated with asthma. She reports the chest pain has been constant for two weeks. She reports she feels it at rest and with exertion. Denies VB or LOF. +FM.   Pain score: 8/10 left side of chest  Vitals:   03/13/24 1728  BP: 133/67  Pulse: 99  Resp: 18  Temp: 97.9 F (36.6 C)  SpO2: 100%     FHT: 155 initial external Lab orders placed from triage: UA, ED EKG

## 2024-03-16 DIAGNOSIS — M5489 Other dorsalgia: Secondary | ICD-10-CM | POA: Diagnosis not present

## 2024-03-17 ENCOUNTER — Telehealth: Payer: Self-pay

## 2024-03-17 ENCOUNTER — Encounter: Admitting: Obstetrics and Gynecology

## 2024-03-17 NOTE — Telephone Encounter (Signed)
 Previously s/w pt on 03/16/24 and about paperwork that was dropped off pertaining to mental health leave of absence. Pt stated that she took herself out of work on 02/03/24 and has not returned related to the death of her father. Advised pt that the provider and office was not aware of this and pt stated that she did not think that she had to disclose it since it was not related to pregnancy.  Called pt today 03/17/24 and advised that after consulting with the provider, her PCP or a mental health professional will have to fill out the paperwork for the leave, pt voiced understanding.

## 2024-03-21 ENCOUNTER — Ambulatory Visit: Admitting: Medical Genetics

## 2024-03-31 ENCOUNTER — Encounter: Payer: Self-pay | Admitting: Obstetrics & Gynecology

## 2024-04-03 ENCOUNTER — Other Ambulatory Visit: Payer: Self-pay | Admitting: *Deleted

## 2024-04-03 DIAGNOSIS — Z3480 Encounter for supervision of other normal pregnancy, unspecified trimester: Secondary | ICD-10-CM

## 2024-04-06 ENCOUNTER — Encounter: Payer: Self-pay | Admitting: Obstetrics and Gynecology

## 2024-04-06 ENCOUNTER — Ambulatory Visit: Admitting: Obstetrics and Gynecology

## 2024-04-06 ENCOUNTER — Other Ambulatory Visit

## 2024-04-06 ENCOUNTER — Other Ambulatory Visit (HOSPITAL_COMMUNITY)
Admission: RE | Admit: 2024-04-06 | Discharge: 2024-04-06 | Disposition: A | Discharge status: Home / Self Care | Source: Ambulatory Visit | Attending: Obstetrics and Gynecology | Admitting: Obstetrics and Gynecology

## 2024-04-06 VITALS — BP 139/65 | HR 96 | Wt 212.0 lb

## 2024-04-06 DIAGNOSIS — O24419 Gestational diabetes mellitus in pregnancy, unspecified control: Secondary | ICD-10-CM

## 2024-04-06 DIAGNOSIS — Z3A28 28 weeks gestation of pregnancy: Secondary | ICD-10-CM

## 2024-04-06 DIAGNOSIS — Z3483 Encounter for supervision of other normal pregnancy, third trimester: Secondary | ICD-10-CM | POA: Diagnosis not present

## 2024-04-06 DIAGNOSIS — Z6834 Body mass index (BMI) 34.0-34.9, adult: Secondary | ICD-10-CM

## 2024-04-06 DIAGNOSIS — Z3480 Encounter for supervision of other normal pregnancy, unspecified trimester: Secondary | ICD-10-CM | POA: Diagnosis not present

## 2024-04-06 DIAGNOSIS — D563 Thalassemia minor: Secondary | ICD-10-CM

## 2024-04-06 DIAGNOSIS — O99013 Anemia complicating pregnancy, third trimester: Secondary | ICD-10-CM

## 2024-04-06 DIAGNOSIS — J4530 Mild persistent asthma, uncomplicated: Secondary | ICD-10-CM

## 2024-04-06 DIAGNOSIS — Z23 Encounter for immunization: Secondary | ICD-10-CM

## 2024-04-06 DIAGNOSIS — Z8249 Family history of ischemic heart disease and other diseases of the circulatory system: Secondary | ICD-10-CM

## 2024-04-06 DIAGNOSIS — R002 Palpitations: Secondary | ICD-10-CM

## 2024-04-06 DIAGNOSIS — D508 Other iron deficiency anemias: Secondary | ICD-10-CM

## 2024-04-06 NOTE — Progress Notes (Signed)
 Pt presents for ROB visit. Pt c/o decreased fetal movement and contractions over the last week.   Tdap given today

## 2024-04-06 NOTE — Progress Notes (Signed)
   PRENATAL VISIT NOTE  Subjective:  Jillian Bishop is a 28 y.o. [id number] at [redacted]w[redacted]d being seen today for ongoing prenatal care.  She is currently monitored for the following issues for this low-risk pregnancy and has Chronic migraine w/o aura w/o status migrainosus, not intractable; Family history of brain aneurysm; Asthma; Vitamin D  deficiency; BMI 34.0-34.9,adult; Supervision of other normal pregnancy, antepartum; and Alpha thalassemia silent carrier on their problem list.  Patient reports  ongoing palpitations with shortness of breath. Her breathing has improved some with inhaler but she notices it frequently. Can complete ADLs and thinks the SOB may just be pregnancy related. Her palpitations however are occurring 4 times a day. They occur randomly and even at rest when working (works from home). Last for 10 minutes and resolves spontaneously. Feels like her heart is racing.  .   Notes change in the pattern of her baby's movement. Is currently feeling baby move. Notes that movements fluctuate and aren't happening as constantly as they were a few weeks ago.  Contractions: Irritability. Vag. Bleeding: None.  Movement: Present. Denies leaking of fluid.   The following portions of the patient's history were reviewed and updated as appropriate: allergies, current medications, past family history, past medical history, past social history, past surgical history and problem list.   Objective:   Vitals:   04/06/24 0920  BP: 139/65  Pulse: 96  Weight: 212 lb (96.2 kg)    Fetal Status: Fetal Heart Rate (bpm): 148   Movement: Present     General:  Alert, oriented and cooperative. Patient is in no acute distress.  Skin: Skin is warm and dry. No rash noted.   Cardiovascular: Normal heart rate noted  Respiratory: Normal respiratory effort, no problems with respiration noted  Abdomen: Soft, gravid, appropriate for gestational age.  Pain/Pressure: Present      Assessment and Plan:  Pregnancy: [id number]  at [redacted]w[redacted]d 1. [redacted] weeks gestation of pregnancy (Primary) 2. Supervision of other normal pregnancy, antepartum Tdap today Discussed paying close attention to fetal movements. Has anterior placenta which may explain why things feel differently from prior pregnancies, but still need to monitor. Reviewed kick counts and when to present to MAU for evaluation - Glucose Tolerance, 2 Hours w/1 Hour - HIV antibody (with reflex) - RPR - CBC  3. BMI 34.0-34.9,adult ldASA Growth US  4/2 @24 /4 761g (61%), AC 28% - no further growths scheduled - Protein / creatinine ratio, urine  4. Alpha thalassemia silent carrier Partner declined testing  5. Family history of brain aneurysm Pt w/ normal MRA on 01/18/22  6. Mild persistent asthma without complication Improving with symbicort  2 puffs BID Using albuterol  prn up to 3x/wk If still using >2x/wk at next visit, will need to adjust maintenance inhaler or add additional agent  7. Palpitations Has appt with Dr. Emmette Harms on 5/30  Please refer to After Visit Summary for other counseling recommendations.   Future Appointments  Date Time Provider Department Center  04/20/2024  8:55 AM Davis, Devon E, PA-C [facility] None  05/05/2024  3:20 PM Tobb, Kardie, DO [facility] None   Izell Marsh, MD

## 2024-04-07 ENCOUNTER — Encounter: Payer: Self-pay | Admitting: Obstetrics and Gynecology

## 2024-04-07 DIAGNOSIS — D649 Anemia, unspecified: Secondary | ICD-10-CM | POA: Insufficient documentation

## 2024-04-07 DIAGNOSIS — O24419 Gestational diabetes mellitus in pregnancy, unspecified control: Secondary | ICD-10-CM | POA: Insufficient documentation

## 2024-04-07 LAB — CBC
Hematocrit: 32.6 % — ABNORMAL LOW (ref 34.0–46.6)
Hemoglobin: 10.4 g/dL — ABNORMAL LOW (ref 11.1–15.9)
MCH: 26.1 pg — ABNORMAL LOW (ref 26.6–33.0)
MCHC: 31.9 g/dL (ref 31.5–35.7)
MCV: 82 fL (ref 79–97)
Platelets: 389 10*3/uL (ref 150–450)
RBC: 3.98 x10E6/uL (ref 3.77–5.28)
RDW: 13.4 % (ref 11.7–15.4)
WBC: 8.9 10*3/uL (ref 3.4–10.8)

## 2024-04-07 LAB — PROTEIN / CREATININE RATIO, URINE
Creatinine, Urine: 128.9 mg/dL
Protein, Ur: 17.2 mg/dL
Protein/Creat Ratio: 133 mg/g{creat} (ref 0–200)

## 2024-04-07 LAB — GLUCOSE TOLERANCE, 2 HOURS W/ 1HR
Glucose, 1 hour: 153 mg/dL (ref 70–179)
Glucose, 2 hour: 120 mg/dL (ref 70–152)
Glucose, Fasting: 124 mg/dL — ABNORMAL HIGH (ref 70–91)

## 2024-04-07 LAB — HIV ANTIBODY (ROUTINE TESTING W REFLEX): HIV Screen 4th Generation wRfx: NONREACTIVE

## 2024-04-07 LAB — RPR: RPR Ser Ql: NONREACTIVE

## 2024-04-07 MED ORDER — FERROUS SULFATE 325 (65 FE) MG PO TABS: 325.0000 mg | ORAL_TABLET | ORAL | 1 refills | Status: AC

## 2024-04-07 NOTE — Addendum Note (Signed)
 Addended by: Loralyn Rochester on: 04/07/2024 12:12 PM   Modules accepted: Orders

## 2024-04-10 ENCOUNTER — Other Ambulatory Visit: Payer: Self-pay

## 2024-04-10 DIAGNOSIS — O24419 Gestational diabetes mellitus in pregnancy, unspecified control: Secondary | ICD-10-CM

## 2024-04-10 LAB — CERVICOVAGINAL ANCILLARY ONLY
Bacterial Vaginitis (gardnerella): POSITIVE — AB
Candida Glabrata: NEGATIVE
Candida Vaginitis: POSITIVE — AB
Chlamydia: NEGATIVE
Comment: NEGATIVE
Comment: NEGATIVE
Comment: NEGATIVE
Comment: NEGATIVE
Comment: NEGATIVE
Comment: NORMAL
Neisseria Gonorrhea: NEGATIVE
Trichomonas: NEGATIVE

## 2024-04-10 MED ORDER — ACCU-CHEK SOFTCLIX LANCETS MISC: 12 refills | Status: AC

## 2024-04-10 MED ORDER — ACCU-CHEK GUIDE TEST VI STRP: ORAL_STRIP | 12 refills | Status: AC

## 2024-04-10 MED ORDER — ACCU-CHEK GUIDE W/DEVICE KIT
1.0000 | PACK | Freq: Four times a day (QID) | 0 refills | Status: AC
Start: 2024-04-10 — End: ?

## 2024-04-10 NOTE — Progress Notes (Signed)
 GDM supplies and referral placed.

## 2024-04-18 NOTE — Progress Notes (Unsigned)
   PRENATAL VISIT NOTE  Subjective:  Jillian Bishop is a 28 y.o. [id number] at [redacted]w[redacted]d being seen today for ongoing prenatal care.  She is currently monitored for the following issues for this {Blank single:19197::"high-risk","low-risk"} pregnancy and has Chronic migraine w/o aura w/o status migrainosus, not intractable; Family history of brain aneurysm; Asthma; Vitamin D  deficiency; BMI 34.0-34.9,adult; Supervision of other normal pregnancy, antepartum; Alpha thalassemia silent carrier; GDM (gestational diabetes mellitus); and Anemia on their problem list.  Patient reports {sx:14538}.   .  .   . Denies leaking of fluid.   The following portions of the patient's history were reviewed and updated as appropriate: allergies, current medications, past family history, past medical history, past social history, past surgical history and problem list.   Objective:  There were no vitals filed for this visit.   Fetal Status:            General: Alert, oriented and cooperative. Patient is in no acute distress.  Skin: Skin is warm and dry. No rash noted.   Cardiovascular: Normal heart rate noted  Respiratory: Normal respiratory effort, no problems with respiration noted  Abdomen: Soft, gravid, appropriate for gestational age.        Pelvic: Cervical exam deferred        Extremities: Normal range of motion.     Mental Status: Normal mood and affect. Normal behavior. Normal judgment and thought content.   Assessment and Plan:  Pregnancy: [id number] at [redacted]w[redacted]d  1. Supervision of other normal pregnancy, antepartum (Primary) Patient is doing well, feeling regular fetal movement BP, FHR, FH appropriate   2. [redacted] weeks gestation of pregnancy Anticipatory guidance about next visits/weeks of pregnancy given.   3. Gestational diabetes mellitus (GDM) in third trimester, gestational diabetes method of control unspecified FBS:  PPBS:  4. Palpitations Appt Dr. Emmette Bishop 05/05/24   5. Mild persistent asthma without  complication Improving with symbicort  2 puffs BID Using albuterol  prn up to 3x/wk  ***  Preterm labor symptoms and general obstetric precautions including but not limited to vaginal bleeding, contractions, leaking of fluid and fetal movement were reviewed in detail with the patient. Please refer to After Visit Summary for other counseling recommendations.   No follow-ups on file.  Future Appointments  Date Time Provider Department Center  04/19/2024  9:00 AM [facility] GDM CLASS [facility] NDM  04/20/2024  8:55 AM Jillian Aye E, PA-C [facility] None  05/05/2024  3:20 PM Bishop, Kardie, DO [facility] None    Jillian Saha, PA-C

## 2024-04-19 ENCOUNTER — Ambulatory Visit

## 2024-04-19 DIAGNOSIS — O24419 Gestational diabetes mellitus in pregnancy, unspecified control: Secondary | ICD-10-CM

## 2024-04-20 ENCOUNTER — Ambulatory Visit (INDEPENDENT_AMBULATORY_CARE_PROVIDER_SITE_OTHER): Admitting: Physician Assistant

## 2024-04-20 VITALS — BP 118/69 | HR 93 | Wt 213.0 lb

## 2024-04-20 DIAGNOSIS — Z3480 Encounter for supervision of other normal pregnancy, unspecified trimester: Secondary | ICD-10-CM

## 2024-04-20 DIAGNOSIS — J4530 Mild persistent asthma, uncomplicated: Secondary | ICD-10-CM

## 2024-04-20 DIAGNOSIS — Z3A30 30 weeks gestation of pregnancy: Secondary | ICD-10-CM

## 2024-04-20 DIAGNOSIS — R002 Palpitations: Secondary | ICD-10-CM

## 2024-04-20 DIAGNOSIS — O24419 Gestational diabetes mellitus in pregnancy, unspecified control: Secondary | ICD-10-CM

## 2024-04-20 MED ORDER — BUDESONIDE-FORMOTEROL FUMARATE 160-4.5 MCG/ACT IN AERO: 2.0000 | INHALATION_SPRAY | Freq: Two times a day (BID) | RESPIRATORY_TRACT | 12 refills | Status: AC

## 2024-04-20 NOTE — Progress Notes (Signed)
 Pt presents for ROB visit. C/o back pain.

## 2024-04-25 ENCOUNTER — Other Ambulatory Visit

## 2024-04-28 ENCOUNTER — Telehealth: Payer: Self-pay

## 2024-04-28 ENCOUNTER — Encounter: Payer: Self-pay | Admitting: Physician Assistant

## 2024-04-28 NOTE — Telephone Encounter (Signed)
 Call returned to patient. Pt to come in for BP check today. Message sent for scheduling.

## 2024-04-28 NOTE — Telephone Encounter (Signed)
 Call placed to patient. Pt reports BP reading of 147/61 this morning. She is not on any BP meds. RN advised patient to take BP at the time of call. BP 148/76; HR 91. Pt states that she took 2 tylenol  for her headache with no relief. She reports dizziness on yesterday that lasted ~20 minutes but has since resolved. RN advised that call will be returned with further directive.

## 2024-05-04 ENCOUNTER — Ambulatory Visit (INDEPENDENT_AMBULATORY_CARE_PROVIDER_SITE_OTHER): Admitting: Physician Assistant

## 2024-05-04 VITALS — BP 102/64 | HR 88 | Wt 215.4 lb

## 2024-05-04 DIAGNOSIS — J4530 Mild persistent asthma, uncomplicated: Secondary | ICD-10-CM

## 2024-05-04 DIAGNOSIS — Z3A32 32 weeks gestation of pregnancy: Secondary | ICD-10-CM

## 2024-05-04 DIAGNOSIS — O24419 Gestational diabetes mellitus in pregnancy, unspecified control: Secondary | ICD-10-CM | POA: Diagnosis not present

## 2024-05-04 DIAGNOSIS — D508 Other iron deficiency anemias: Secondary | ICD-10-CM | POA: Diagnosis not present

## 2024-05-04 DIAGNOSIS — Z3480 Encounter for supervision of other normal pregnancy, unspecified trimester: Secondary | ICD-10-CM

## 2024-05-04 DIAGNOSIS — R030 Elevated blood-pressure reading, without diagnosis of hypertension: Secondary | ICD-10-CM

## 2024-05-04 NOTE — Patient Instructions (Addendum)
-  Please return for your blood pressure check in one week  -You should receive a call to reschedule your diabetes education class

## 2024-05-04 NOTE — Progress Notes (Signed)
 Pt presents for rob. Pt has no questions or concerns at this time.

## 2024-05-04 NOTE — Progress Notes (Signed)
 PRENATAL VISIT NOTE  Subjective:  Jillian Bishop is a 28 y.o. [phone] at [redacted]w[redacted]d being seen today for ongoing prenatal care.  She is currently monitored for the following issues for this high-risk pregnancy and has Chronic migraine w/o aura w/o status migrainosus, not intractable; Family history of brain aneurysm; Asthma; Vitamin D  deficiency; BMI 34.0-34.9,adult; Supervision of other normal pregnancy, antepartum; Alpha thalassemia silent carrier; GDM (gestational diabetes mellitus); and Anemia on their problem list.  Patient has not started new inhaler, as she believes her coughing was due to a cold/weather changes, not asthma.    Contractions: Irritability. Vag. Bleeding: None.  Movement: Present. Denies leaking of fluid.   The following portions of the patient's history were reviewed and updated as appropriate: allergies, current medications, past family history, past medical history, past social history, past surgical history and problem list.   Objective:    Vitals:   05/04/24 0829  BP: 102/64  Pulse: 88  Weight: 215 lb 6.4 oz (97.7 kg)    Fetal Status:  Fetal Heart Rate (bpm): 154 Fundal Height: 35 cm Movement: Present    General: Alert, oriented and cooperative. Patient is in no acute distress.  Skin: Skin is warm and dry. No rash noted.   Cardiovascular: Normal heart rate noted  Respiratory: Normal respiratory effort, no problems with respiration noted  Abdomen: Soft, gravid, appropriate for gestational age.  Pain/Pressure: Present     Pelvic: Cervical exam deferred        Extremities: Normal range of motion.  Edema: None  Mental Status: Normal mood and affect. Normal behavior. Normal judgment and thought content.   Assessment and Plan:  Pregnancy: [id number] at [redacted]w[redacted]d  1. Supervision of other normal pregnancy, antepartum (Primary) Patient doing well, feeling regular fetal movement BP, FHR, FH appropriate  2. [redacted] weeks gestation of pregnancy Anticipatory guidance about  next visits/weeks of pregnancy given.  Appt with [facility] cardiology tomorrow   3. Elevated blood pressure reading  Two home BP >140/90 in visit interim, scheduled patient to return in 1 week for BP check. Denies preE ssxs. Aware of sxs that should prompt visit to MAU. Discussed home BP thresholds.  - Protein / creatinine ratio, urine - Comp Met (CMET) - CBC  4. Gestational diabetes mellitus (GDM) in third trimester, gestational diabetes method of control unspecified Control unclear; did not attend diabetes education class, no log today. Eating one meal daily.  Patient reports FBS 80s-90s and PPBS 115-120s  We discussed risks of uncontrolled GDM and I recommended that she be rescheduled for nutrition and education class, to which she agrees. Nutrition tips and GDM information included in AVS.  - Amb Referral to Nutrition and Diabetic Education  5. Other iron deficiency anemia 04/06/24 hgb 10.4 Compliant on oral iron  6. Mild persistent asthma without complication Increased symbicort  inhaler dose last visit. Not taking, as she believes coughing is due to cold/ weather changes. Discussed that either of these are triggers for asthma flares and a greater indication for inhaler use, to which she stated understanding. Included more patient education about asthma in AVS.   Preterm labor symptoms and general obstetric precautions including but not limited to vaginal bleeding, contractions, leaking of fluid and fetal movement were reviewed in detail with the patient.  Please refer to After Visit Summary for other counseling recommendations.   Return in about 2 weeks (around 05/18/2024).  Future Appointments  Date Time Provider Department Center  05/05/2024  3:20 PM Tobb, Kardie, DO [facility] None  Aveah Castell E Keturah Yerby, PA-C

## 2024-05-05 ENCOUNTER — Ambulatory Visit: Admitting: Cardiology

## 2024-05-05 ENCOUNTER — Ambulatory Visit: Payer: Self-pay | Admitting: Physician Assistant

## 2024-05-05 LAB — CBC
Hematocrit: 32.8 % — ABNORMAL LOW (ref 34.0–46.6)
Hemoglobin: 10.6 g/dL — ABNORMAL LOW (ref 11.1–15.9)
MCH: 26.3 pg — ABNORMAL LOW (ref 26.6–33.0)
MCHC: 32.3 g/dL (ref 31.5–35.7)
MCV: 81 fL (ref 79–97)
Platelets: 352 10*3/uL (ref 150–450)
RBC: 4.03 x10E6/uL (ref 3.77–5.28)
RDW: 13.9 % (ref 11.7–15.4)
WBC: 8.7 10*3/uL (ref 3.4–10.8)

## 2024-05-05 LAB — COMPREHENSIVE METABOLIC PANEL WITH GFR
ALT: 16 IU/L (ref 0–32)
AST: 21 IU/L (ref 0–40)
Albumin: 3.2 g/dL — ABNORMAL LOW (ref 4.0–5.0)
Alkaline Phosphatase: 66 IU/L (ref 44–121)
BUN/Creatinine Ratio: 15 (ref 9–23)
BUN: 8 mg/dL (ref 6–20)
Bilirubin Total: <0.2 mg/dL (ref 0.0–1.2)
CO2: 18 mmol/L — ABNORMAL LOW (ref 20–29)
Calcium: 8.6 mg/dL — ABNORMAL LOW (ref 8.7–10.2)
Chloride: 103 mmol/L (ref 96–106)
Creatinine, Ser: 0.53 mg/dL — ABNORMAL LOW (ref 0.57–1.00)
Globulin, Total: 2.6 g/dL (ref 1.5–4.5)
Glucose: 73 mg/dL (ref 70–99)
Potassium: 4.5 mmol/L (ref 3.5–5.2)
Sodium: 135 mmol/L (ref 134–144)
Total Protein: 5.8 g/dL — ABNORMAL LOW (ref 6.0–8.5)
eGFR: 128 mL/min/{1.73_m2} (ref >59)

## 2024-05-05 LAB — PROTEIN / CREATININE RATIO, URINE
Creatinine, Urine: 174.9 mg/dL
Protein, Ur: 28.7 mg/dL
Protein/Creat Ratio: 164 mg/g{creat} (ref 0–200)

## 2024-05-10 ENCOUNTER — Inpatient Hospital Stay (HOSPITAL_COMMUNITY)
Admission: AD | Admit: 2024-05-10 | Discharge: 2024-05-10 | Disposition: A | Discharge status: Home / Self Care | Attending: Obstetrics and Gynecology | Admitting: Obstetrics and Gynecology

## 2024-05-10 ENCOUNTER — Encounter (HOSPITAL_COMMUNITY): Payer: Self-pay | Admitting: Obstetrics and Gynecology

## 2024-05-10 DIAGNOSIS — Z3A33 33 weeks gestation of pregnancy: Secondary | ICD-10-CM | POA: Diagnosis not present

## 2024-05-10 DIAGNOSIS — R1030 Lower abdominal pain, unspecified: Secondary | ICD-10-CM | POA: Diagnosis present

## 2024-05-10 DIAGNOSIS — O368130 Decreased fetal movements, third trimester, not applicable or unspecified: Secondary | ICD-10-CM | POA: Diagnosis present

## 2024-05-10 DIAGNOSIS — Z3689 Encounter for other specified antenatal screening: Secondary | ICD-10-CM | POA: Diagnosis not present

## 2024-05-10 DIAGNOSIS — M545 Low back pain, unspecified: Secondary | ICD-10-CM | POA: Insufficient documentation

## 2024-05-10 LAB — URINALYSIS, ROUTINE W REFLEX MICROSCOPIC
Bilirubin Urine: NEGATIVE
Glucose, UA: NEGATIVE mg/dL
Hgb urine dipstick: NEGATIVE
Ketones, ur: NEGATIVE mg/dL
Nitrite: NEGATIVE
Protein, ur: NEGATIVE mg/dL
Specific Gravity, Urine: 1.015 (ref 1.005–1.030)
pH: 7 (ref 5.0–8.0)

## 2024-05-10 LAB — URINALYSIS, MICROSCOPIC (REFLEX)

## 2024-05-10 NOTE — MAU Provider Note (Signed)
 History     CSN: [id number]  Arrival date and time: 05/10/24 1136   Event Date/Time   First Provider Initiated Contact with Patient 05/10/24 1420      Chief Complaint  Patient presents with   Decreased Fetal Movement   Abdominal Pain   Back Pain   HPI [name] is a 28 y.o. year old [id number] female at [redacted]w[redacted]d weeks gestation who presents to MAU reporting no FM since last night, mild, lower abdominal cramping and lower back pain. She denies any VB. She receives [facility] with [facility]; next appt is 05/18/2024.   OB History     Gravida  7   Para  4   Term  4   Preterm      AB  2   Living  4      SAB  2   IAB      Ectopic      Multiple  0   Live Births  4           Past Medical History:  Diagnosis Date   Asthma    mild well controlled, no inhaler use since 2016   Benign teratoma of ovary, right    COVID-19 11/02/2019   sore throat cough, sob loss of taste and smell, cough until 03-11-2020 all symptoms resolved now   Dermoid cyst    GERD (gastroesophageal reflux disease)    Headache    tension, occ migraine   HPV (human papilloma virus) infection     Past Surgical History:  Procedure Laterality Date   APPENDECTOMY  age 60 or 3   DILATION AND EVACUATION N/A 11/19/2018   Procedure: DILATATION AND EVACUATION;  Surgeon: Rogene Claude, MD;  Location: [facility];  Service: Gynecology;  Laterality: N/A;   LAPAROSCOPIC OVARIAN CYSTECTOMY Right 09/05/2020   Procedure: LAPAROSCOPIC OVARIAN CYSTECTOMY LYSIS OF ADHESISONS;  Surgeon: Loa Riling, DO;  Location: [facility];  Service: Gynecology;  Laterality: Right;    Family History  Problem Relation Age of Onset   Hypertension Mother    Diabetes Mother    Hypertension Father    Diabetes Father    Hypertension Sister    Hypertension Sister    Diabetes Sister    Diabetes Maternal Aunt    Diabetes Maternal Grandmother    Hypertension Maternal Grandmother    Heart disease Paternal  Grandmother    Hypertension Paternal Grandmother     Social History   Tobacco Use   Smoking status: Never    Passive exposure: Never   Smokeless tobacco: Never  Vaping Use   Vaping status: Never Used  Substance Use Topics   Alcohol use: Not Currently   Drug use: No    Allergies: No Known Allergies  Medications Prior to Admission  Medication Sig Dispense Refill Last Dose/Taking   aspirin  EC 81 MG tablet Take 1 tablet (81 mg total) by mouth daily. 60 tablet 2 05/10/2024 Morning   budesonide -formoterol  (SYMBICORT ) 160-4.5 MCG/ACT inhaler Inhale 2 puffs into the lungs in the morning and at bedtime. 1 each 12 Past Week   ferrous sulfate  325 (65 FE) MG tablet Take 1 tablet (325 mg total) by mouth every other day. 90 tablet 1 05/09/2024 Morning   Prenatal Vit-Fe Fumarate-FA (PREPLUS) 27-1 MG TABS Take 1 tablet by mouth daily. 30 tablet 13 05/10/2024 Morning   Accu-Chek Softclix Lancets lancets Please check your blood sugar 4 times daily. Once in the morning when waking up (fasting), and 2 hours  after breakfast, lunch, and dinner. (Patient not taking: Reported on 05/04/2024) 100 each 12    albuterol  (VENTOLIN  HFA) 108 (90 Base) MCG/ACT inhaler Inhale 2 puffs into the lungs every 6 (six) hours as needed for wheezing or shortness of breath. 8 g 2 More than a month   Blood Glucose Monitoring Suppl (ACCU-CHEK GUIDE) w/Device KIT 1 Device by Does not apply route 4 (four) times daily. Please check your blood sugar 4 times daily. Once in the morning when waking up (fasting), and 2 hours after breakfast, lunch, and dinner. (Patient not taking: Reported on 05/04/2024) 1 kit 0    famotidine  (PEPCID ) 20 MG tablet Take 1 tablet (20 mg total) by mouth 2 (two) times daily. 60 tablet 3 Unknown   glucose blood (ACCU-CHEK GUIDE TEST) test strip Please check your blood sugar 4 times daily. Once in the morning when waking up (fasting), and 2 hours after breakfast, lunch, and dinner. (Patient not taking: Reported on  05/04/2024) 100 each 12    SUMAtriptan  (IMITREX ) 100 MG tablet Take 1 tablet (100 mg total) by mouth once as needed for up to 1 dose for migraine. May repeat in 2 hours if headache persists or recurs. (Patient not taking: Reported on 04/06/2024) 9 tablet 11     Review of Systems  Constitutional: Negative.   HENT: Negative.    Eyes: Negative.   Respiratory: Negative.    Cardiovascular: Negative.   Gastrointestinal: Negative.   Endocrine: Negative.   Genitourinary: Negative.   Musculoskeletal:  Positive for back pain.  Skin: Negative.   Allergic/Immunologic: Negative.   Neurological: Negative.   Hematological: Negative.   Psychiatric/Behavioral: Negative.     Physical Exam   Blood pressure 122/63, temperature 98.4 F (36.9 C), temperature source Oral, resp. rate 17, height 5' 3 (1.6 m), weight 97.8 kg, last menstrual period 09/18/2023, SpO2 100%.  Physical Exam Vitals and nursing note reviewed.  Constitutional:      Appearance: Normal appearance. She is obese.   Cardiovascular:     Rate and Rhythm: Normal rate.  Pulmonary:     Effort: Pulmonary effort is normal.  Abdominal:     Palpations: Abdomen is soft.     Tenderness: There is no right CVA tenderness or left CVA tenderness.   Musculoskeletal:        General: Normal range of motion.   Skin:    General: Skin is warm and dry.   Neurological:     Mental Status: She is alert and oriented to person, place, and time.   Psychiatric:        Mood and Affect: Mood normal.        Behavior: Behavior normal.        Thought Content: Thought content normal.        Judgment: Judgment normal.    REACTIVE NST - FHR: 135 bpm / moderate variability / accels present / decels absent / TOCO: none MAU Course  Procedures  MDM Flexeril 10 mg po -- resolved back pain  Assessment and Plan  1. NST (non-stress test) reactive (Primary) - Category 1 FHR tracing - Reassurance given that fetal well-being is normal  2. [redacted] weeks  gestation of pregnancy   - Discharge home - Keep scheduled appt with [facility] on 05/17/2024 - Patient verbalized an understanding of the plan of care and agrees.    Almond Army, CNM 05/10/2024, 2:20 PM

## 2024-05-10 NOTE — Telephone Encounter (Signed)
 S/w pt who advised that she has not felt any fetal movement since last night. Pt states that she drank cold water and juice and has been pressing on her belly with no return movement. Pt states that she has also started feeling period-type cramping with discharge. Advised pt to be evaluated at the hospital.

## 2024-05-10 NOTE — MAU Note (Signed)
 Jillian Bishop is a 28 y.o. at [redacted]w[redacted]d here in MAU reporting: hasn't felt the baby move since last night.  Having mild cramping in abd and lower back. Denies bleeding or LOF.  Onset of complaint: last night Pain score: mild Vitals:   05/10/24 1150  BP: 122/63  Resp: 17  Temp: 98.4 F (36.9 C)  SpO2: 100%     FHT:150 Lab orders placed from triage:  urine   Child visitation explained.   Does have someone she can call.

## 2024-05-11 ENCOUNTER — Ambulatory Visit

## 2024-05-14 ENCOUNTER — Encounter: Payer: Self-pay | Admitting: Certified Nurse Midwife

## 2024-05-15 ENCOUNTER — Encounter: Payer: Self-pay | Admitting: Obstetrics and Gynecology

## 2024-05-17 ENCOUNTER — Encounter: Payer: Self-pay | Admitting: Obstetrics and Gynecology

## 2024-05-17 ENCOUNTER — Ambulatory Visit (INDEPENDENT_AMBULATORY_CARE_PROVIDER_SITE_OTHER): Admitting: Obstetrics and Gynecology

## 2024-05-17 VITALS — BP 131/79 | HR 96 | Wt 219.2 lb

## 2024-05-17 DIAGNOSIS — Z3A34 34 weeks gestation of pregnancy: Secondary | ICD-10-CM

## 2024-05-17 DIAGNOSIS — Z3480 Encounter for supervision of other normal pregnancy, unspecified trimester: Secondary | ICD-10-CM | POA: Diagnosis not present

## 2024-05-17 DIAGNOSIS — O24419 Gestational diabetes mellitus in pregnancy, unspecified control: Secondary | ICD-10-CM

## 2024-05-17 NOTE — Patient Instructions (Signed)
   Considering Waterbirth? Guide for patients at [facility]) Why consider waterbirth? Gentle birth for babies  Less pain medicine used in labor  May allow for passive descent/less pushing  May reduce perineal tears  More mobility and instinctive maternal position changes  Increased maternal relaxation   Is waterbirth safe? What are the risks of infection, drowning or other complications? Infection:  Very low risk (3.7 % for tub vs 4.8% for bed)  7 in 8000 waterbirths with documented infection  Poorly cleaned equipment most common cause  Slightly lower group B strep transmission rate  Drowning  Maternal:  Very low risk  Related to seizures or fainting  Newborn:  Very low risk. No evidence of increased risk of respiratory problems in multiple large studies  Physiological protection from breathing under water  Avoid underwater birth if there are any fetal complications  Once baby's head is out of the water, keep it out.  Birth complication  Some reports of cord trauma, but risk decreased by bringing baby to surface gradually  No evidence of increased risk of shoulder dystocia. Mothers can usually change positions faster in water than in a bed, possibly aiding the maneuvers to free the shoulder.   There are 2 things you MUST do to have a waterbirth with [facility]: Attend a waterbirth class at [facility]   3rd Wednesday of every month from 7-9 pm (virtual during COVID) [employer] at [email or web address] or [email or web address] or by calling [phone] Bring [place] the certificate from the class to your prenatal appointment or send via MyChart Meet with a midwife at 36 weeks* to see if you can still plan a waterbirth and to sign the consent.   *We also recommend that you schedule as many of your prenatal visits with a midwife as possible.    Helpful information: You may want to bring a bathing suit top to the hospital  to wear during labor but this is optional.  All other supplies are provided by the hospital. Please arrive at the hospital with signs of active labor, and do not wait at home until late in labor. It takes 45 min- 1 hour for fetal monitoring, and check in to your room to take place, plus transport and filling of the waterbirth tub.    Things that would prevent you from having a waterbirth: Premature, <37wks  Previous cesarean birth  Presence of thick meconium-stained fluid  Multiple gestation (Twins, triplets, etc.)  Uncontrolled diabetes or gestational diabetes requiring medication  Hypertension diagnosed in pregnancy or preexisting hypertension (gestational hypertension, preeclampsia, or chronic hypertension) Fetal growth restriction (your baby measures less than 10th percentile on ultrasound) Heavy vaginal bleeding  Non-reassuring fetal heart rate  Active infection (MRSA, etc.). Group B Strep is NOT a contraindication for waterbirth.  If your labor has to be induced and induction method requires continuous monitoring of the baby's heart rate  Other risks/issues identified by your obstetrical provider   Please remember that birth is unpredictable. Under certain unforeseeable circumstances your provider may advise against giving birth in the tub. These decisions will be made on a case-by-case basis and with the safety of you and your baby as our highest priority.    Updated 03/11/22

## 2024-05-17 NOTE — Progress Notes (Signed)
 Pt reports regular contractions past 2 days. Asking for cervix check today.

## 2024-05-17 NOTE — Progress Notes (Signed)
   PRENATAL VISIT NOTE  Subjective:  Jillian Bishop is a 28 y.o. [id number] at [redacted]w[redacted]d being seen today for ongoing prenatal care.  She is currently monitored for the following issues for this low-risk pregnancy and has Chronic migraine w/o aura w/o status migrainosus, not intractable; Family history of brain aneurysm; Asthma; Vitamin D  deficiency; BMI 34.0-34.9,adult; Supervision of other normal pregnancy, antepartum; Alpha thalassemia silent carrier; GDM (gestational diabetes mellitus); and Anemia on their problem list.  Patient reports reports irregular contractions the past few days. Also reports pelvic pain and low back pain. .  Contractions: Regular. Vag. Bleeding: None.  Movement: Present. Denies leaking of fluid.   The following portions of the patient's history were reviewed and updated as appropriate: allergies, current medications, past family history, past medical history, past social history, past surgical history and problem list.   Objective:    Vitals:   05/17/24 0828  BP: 131/79  Pulse: 96  Weight: 219 lb 3.2 oz (99.4 kg)    Fetal Status:  Fetal Heart Rate (bpm): 142 Fundal Height: 34 cm Movement: Present    General: Alert, oriented and cooperative. Patient is in no acute distress.  Skin: Skin is warm and dry. No rash noted.   Cardiovascular: Normal heart rate noted  Respiratory: Normal respiratory effort, no problems with respiration noted  Abdomen: Soft, gravid, appropriate for gestational age.  Pain/Pressure: Present     Pelvic: Cervical exam performed in the presence of a chaperone Dilation: Closed Effacement (%): Thick    Extremities: Normal range of motion.  Edema: Trace  Mental Status: Normal mood and affect. Normal behavior. Normal judgment and thought content.   Assessment and Plan:  Pregnancy: [id number] at [redacted]w[redacted]d 1. Supervision of other normal pregnancy, antepartum (Primary) BP and FHR normal Doing well, feeling regular movement    2. Gestational diabetes  mellitus (GDM) in third trimester, gestational diabetes method of control unspecified Checking sugars when she eats, reports fastings mostly 70sand had 40, pp 110s occasional 120 Has not had follow up growth, schedule today  - US  MFM OB FOLLOW UP; Future  3. [redacted] weeks gestation of pregnancy Anticipatory guidance regarding swabs next visit  Discussed waterbirth, class, meeting with waterbirth provider, stay low risk, will schedule with midwife  Strict precautions discussed regarding contractions and when to return to MAU   Preterm labor symptoms and general obstetric precautions including but not limited to vaginal bleeding, contractions, leaking of fluid and fetal movement were reviewed in detail with the patient. Please refer to After Visit Summary for other counseling recommendations.   Return in about 2 weeks (around 05/31/2024), or schedule with midwife, needs MFM u/s asap, for OB VISIT (MD or APP).   Susi Eric, FNP

## 2024-05-18 ENCOUNTER — Encounter: Admitting: Physician Assistant

## 2024-05-18 DIAGNOSIS — Z419 Encounter for procedure for purposes other than remedying health state, unspecified: Secondary | ICD-10-CM | POA: Diagnosis not present

## 2024-05-28 ENCOUNTER — Inpatient Hospital Stay (HOSPITAL_COMMUNITY)
Admission: AD | Admit: 2024-05-28 | Discharge: 2024-05-28 | Disposition: A | Discharge status: Home / Self Care | Attending: Obstetrics and Gynecology | Admitting: Obstetrics and Gynecology

## 2024-05-28 ENCOUNTER — Encounter (HOSPITAL_COMMUNITY): Payer: Self-pay | Admitting: Obstetrics and Gynecology

## 2024-05-28 DIAGNOSIS — Z3A36 36 weeks gestation of pregnancy: Secondary | ICD-10-CM | POA: Diagnosis not present

## 2024-05-28 DIAGNOSIS — Z3689 Encounter for other specified antenatal screening: Secondary | ICD-10-CM | POA: Diagnosis not present

## 2024-05-28 DIAGNOSIS — B9689 Other specified bacterial agents as the cause of diseases classified elsewhere: Secondary | ICD-10-CM | POA: Diagnosis not present

## 2024-05-28 DIAGNOSIS — Z0371 Encounter for suspected problem with amniotic cavity and membrane ruled out: Secondary | ICD-10-CM | POA: Diagnosis not present

## 2024-05-28 DIAGNOSIS — O23593 Infection of other part of genital tract in pregnancy, third trimester: Secondary | ICD-10-CM | POA: Diagnosis not present

## 2024-05-28 DIAGNOSIS — O4703 False labor before 37 completed weeks of gestation, third trimester: Secondary | ICD-10-CM | POA: Diagnosis present

## 2024-05-28 LAB — RUPTURE OF MEMBRANE (ROM)PLUS: Rom Plus: NEGATIVE

## 2024-05-28 LAB — POCT FERN TEST: POCT Fern Test: NEGATIVE

## 2024-05-28 LAB — WET PREP, GENITAL
Sperm: NONE SEEN
Trich, Wet Prep: NONE SEEN
WBC, Wet Prep HPF POC: >=10 — AB (ref <10)
Yeast Wet Prep HPF POC: NONE SEEN

## 2024-05-28 MED ORDER — METRONIDAZOLE 0.75 % VA GEL: 1.0000 | Freq: Every day | VAGINAL | 1 refills | Status: AC

## 2024-05-28 NOTE — MAU Note (Signed)
 MAU Labor Triage Note:  [name] is a 28 y.o. at [redacted]w[redacted]d here in MAU reporting:  Contractions every: couple of minutes Onset of ctx: last night Pain Score: 7  Pain Location: Abdomen  ROM: reports trickle of clear, watery fluid since 1700 yesterday evening Vaginal Bleeding: denies Last SVE: 6/11 0/thick Labor Pain Management Plan: Planning unmedicated  GBS: Unknown  Fetal Movement: Reports positive FM FHT: Fetal Heart Rate Mode: Doppler Baseline Rate (A): 143 bpm Multiple birth?: No  Vitals:   05/28/24 0512 05/28/24 0513  BP:  139/71  Pulse: 83   Resp: 16   Temp: 98.6 F (37 C)   SpO2: 100%        Lab orders placed from triage: MAU Labor Eval OB Office: Faculty

## 2024-05-28 NOTE — MAU Provider Note (Signed)
 S: Jillian Bishop is a 28 y.o. [id number] at [redacted]w[redacted]d  who presents to MAU today for contractions and leaking of fluid.   After evaluation patient cervix found to be closed and Fern negative. Nurse instructed to collect ROM plus and wet prep.   Cervical exam by RN:  Dilation: Closed Effacement (%): Thick  Fetal Monitoring: Baseline: 135 Variability: Moderate Accelerations: Present 15x15 Decelerations: None Contractions: Irregular  Results for orders placed or performed during the hospital encounter of 05/28/24 (from the past 24 hours)  POCT fern test     Status: Normal   Collection Time: 05/28/24  5:54 AM  Result Value Ref Range   POCT Fern Test Negative = intact amniotic membranes   Rupture of Membrane (ROM) Plus     Status: None   Collection Time: 05/28/24  6:05 AM  Result Value Ref Range   Rom Plus NEGATIVE   Wet prep, genital     Status: Abnormal   Collection Time: 05/28/24  6:05 AM  Result Value Ref Range   Yeast Wet Prep HPF POC NONE SEEN NONE SEEN   Trich, Wet Prep NONE SEEN NONE SEEN   Clue Cells Wet Prep HPF POC PRESENT (A) NONE SEEN   WBC, Wet Prep HPF POC >=10 (A) <10   Sperm NONE SEEN     MDM Discussed patient with RN. NST and results reviewed.  Prescription sent.   A: [facility] at [redacted]w[redacted]d  No leakage of amniotic fluid BV Cat I FT  P: ROM plus returns negative Wet prep with clue cells suggestive of BV.  Patient desires vaginal treatment and Rx for metrogel  sent to pharmacy on file. . Nurse to give precautions. Patient to follow up as scheduled.  Discharge home.  Patient may return to MAU as needed or when in labor.  Synthia Raisin, CNM 05/28/2024 6:43 AM

## 2024-05-30 ENCOUNTER — Ambulatory Visit (HOSPITAL_BASED_OUTPATIENT_CLINIC_OR_DEPARTMENT_OTHER): Attending: Obstetrics and Gynecology | Admitting: Obstetrics and Gynecology

## 2024-05-30 ENCOUNTER — Ambulatory Visit (HOSPITAL_BASED_OUTPATIENT_CLINIC_OR_DEPARTMENT_OTHER)

## 2024-05-30 ENCOUNTER — Other Ambulatory Visit: Payer: Self-pay | Admitting: Obstetrics and Gynecology

## 2024-05-30 ENCOUNTER — Ambulatory Visit: Admitting: Advanced Practice Midwife

## 2024-05-30 ENCOUNTER — Encounter: Payer: Self-pay | Admitting: Advanced Practice Midwife

## 2024-05-30 ENCOUNTER — Ambulatory Visit

## 2024-05-30 ENCOUNTER — Other Ambulatory Visit (HOSPITAL_COMMUNITY)
Admission: RE | Admit: 2024-05-30 | Discharge: 2024-05-30 | Disposition: A | Discharge status: Home / Self Care | Source: Ambulatory Visit | Attending: Advanced Practice Midwife | Admitting: Advanced Practice Midwife

## 2024-05-30 VITALS — BP 123/79 | HR 96 | Wt 218.8 lb

## 2024-05-30 VITALS — BP 116/65

## 2024-05-30 DIAGNOSIS — Z362 Encounter for other antenatal screening follow-up: Secondary | ICD-10-CM | POA: Insufficient documentation

## 2024-05-30 DIAGNOSIS — O99213 Obesity complicating pregnancy, third trimester: Secondary | ICD-10-CM | POA: Insufficient documentation

## 2024-05-30 DIAGNOSIS — O283 Abnormal ultrasonic finding on antenatal screening of mother: Secondary | ICD-10-CM

## 2024-05-30 DIAGNOSIS — Z148 Genetic carrier of other disease: Secondary | ICD-10-CM

## 2024-05-30 DIAGNOSIS — O24419 Gestational diabetes mellitus in pregnancy, unspecified control: Secondary | ICD-10-CM

## 2024-05-30 DIAGNOSIS — Z3A36 36 weeks gestation of pregnancy: Secondary | ICD-10-CM

## 2024-05-30 DIAGNOSIS — O365930 Maternal care for other known or suspected poor fetal growth, third trimester, not applicable or unspecified: Secondary | ICD-10-CM | POA: Diagnosis not present

## 2024-05-30 DIAGNOSIS — E669 Obesity, unspecified: Secondary | ICD-10-CM | POA: Diagnosis not present

## 2024-05-30 DIAGNOSIS — Z3480 Encounter for supervision of other normal pregnancy, unspecified trimester: Secondary | ICD-10-CM

## 2024-05-30 DIAGNOSIS — O24410 Gestational diabetes mellitus in pregnancy, diet controlled: Secondary | ICD-10-CM

## 2024-05-30 DIAGNOSIS — O365931 Maternal care for other known or suspected poor fetal growth, third trimester, fetus 1: Secondary | ICD-10-CM

## 2024-05-30 NOTE — Procedures (Signed)
 Jillian Bishop 11/05/1996 109w3d  Fetus A Non-Stress Test Interpretation for 05/30/24  Indication: IUGR  Fetal Heart Rate A Mode: External Baseline Rate (A): 140 bpm Variability: Moderate Accelerations: 15 x 15 Decelerations: None Multiple birth?: No  Uterine Activity Mode: Palpation, Toco Contraction Frequency (min): none noted Resting Tone Palpated: Relaxed  Interpretation (Fetal Testing) Nonstress Test Interpretation: Reactive Comments: Reviewed with Dr. Arna

## 2024-05-30 NOTE — Progress Notes (Deleted)
   PRENATAL VISIT NOTE  Subjective:  Jillian Bishop is a 28 y.o. H2E5975 at [redacted]w[redacted]d being seen today for ongoing prenatal care.  She is currently monitored for the following issues for this {Blank single:19197::high-risk,low-risk} pregnancy and has Chronic migraine w/o aura w/o status migrainosus, not intractable; Family history of brain aneurysm; Asthma; Vitamin D  deficiency; BMI 34.0-34.9,adult; Supervision of other normal pregnancy, antepartum; Alpha thalassemia silent carrier; GDM (gestational diabetes mellitus); and Anemia on their problem list.  Patient reports {sx:14538}.  Contractions: Irritability. Vag. Bleeding: None.  Movement: Increased. Denies leaking of fluid.   The following portions of the patient's history were reviewed and updated as appropriate: allergies, current medications, past family history, past medical history, past social history, past surgical history and problem list.   Objective:    Vitals:   05/30/24 1123  BP: 123/79  Pulse: 96  Weight: 218 lb 12.8 oz (99.2 kg)    Fetal Status:  Fetal Heart Rate (bpm): 148   Movement: Increased    General: Alert, oriented and cooperative. Patient is in no acute distress.  Skin: Skin is warm and dry. No rash noted.   Cardiovascular: Normal heart rate noted  Respiratory: Normal respiratory effort, no problems with respiration noted  Abdomen: Soft, gravid, appropriate for gestational age.  Pain/Pressure: Present     Pelvic: Cervical exam performed in the presence of a chaperone        Extremities: Normal range of motion.  Edema: Trace  Mental Status: Normal mood and affect. Normal behavior. Normal judgment and thought content.   Assessment and Plan:  Pregnancy: H2E5975 at [redacted]w[redacted]d 1. Supervision of other normal pregnancy, antepartum (Primary) *** - Cervicovaginal ancillary only( [place]) - Culture, beta strep (group b only)  2. [redacted] weeks gestation of pregnancy ***  {Blank single:19197::Term,Preterm} labor  symptoms and general obstetric precautions including but not limited to vaginal bleeding, contractions, leaking of fluid and fetal movement were reviewed in detail with the patient. Please refer to After Visit Summary for other counseling recommendations.   No follow-ups on file.  Future Appointments  Date Time Provider Department Center  06/03/2024  6:30 AM [facility] SCHED ROOM [facility] None    Olam Boards, CNM

## 2024-05-30 NOTE — Progress Notes (Signed)
 Maternal-Fetal Medicine Consultation Name: Jillian Bishop MRN: [id number]  G7 P4024 at 36w 3d gestation. Patient has gestational diabetes and reports she has not been checking her blood glucose regularly. Obstetric history significant for 4 term vaginal deliveries.  All her children are in good health.  Ultrasound On today's ultrasound, amniotic fluid is normal and good fetal activity seen.  Fetal growth is appropriate for gestational age.  Abdominal circumference measurement is at the 12th percentile.  Measurement of abdominal circumference was difficult and I feel it could be an overestimation. Umbilical artery Doppler showed increased S/D ratio.  NST is reactive.  BPP 10/10.  Fetal growth restriction I explained the possibility of fetal growth restriction.  I informed the patient that the abdominal circumference could be smaller than the one we measured because of fetal position.  I counseled the patient on the significance of abnormal Doppler studies.  I am unable to ascertain the control of diabetes.  Poorly controlled diabetes can lead to complications including stillbirth.  Fetal growth restriction, abnormal Doppler studies and suboptimal or poorly controlled diabetes or risk factors. I recommended a nonurgent delivery.  Patient was counseled that delivery at [redacted] weeks gestation places the newborn at a higher risk for respiratory distress syndrome. Alternatively, she can be admitted for inpatient management of diabetes and repeat ultrasound evaluation.  Patient does not want to get admitted and prefers to deliver at [redacted] weeks gestation. Her pregnancy is well dated by LMP consistent with 7-week ultrasound.  Recommendations -Delivery at [redacted] weeks gestation.  Consultation including face-to-face (more than 50%) counseling 20 minutes.

## 2024-05-30 NOTE — Progress Notes (Signed)
 Pain and pressure lower abdomen and pelvis (scale of 8). Pt states sometimes feels like its all going to fall out.  Had ultrasound this morning, they told her to deliver at 37 weeks and speak to provider here today about it.   Pt states she has been checking blood sugars, but doesn't have log with her today. Pt states readings have been within range.

## 2024-05-30 NOTE — Progress Notes (Addendum)
   PRENATAL VISIT NOTE  Subjective:  Jillian Bishop is a 28 y.o. H2E5975 at [redacted]w[redacted]d being seen today for ongoing prenatal care.  She is currently monitored for the following issues for this high-risk pregnancy and has Chronic migraine w/o aura w/o status migrainosus, not intractable; Family history of brain aneurysm; Asthma; Vitamin D  deficiency; BMI 34.0-34.9,adult; Supervision of other normal pregnancy, antepartum; Alpha thalassemia silent carrier; GDM (gestational diabetes mellitus); and Anemia on their problem list.  Patient reports occasional contractions.  Contractions: Irritability. Vag. Bleeding: None.  Movement: Increased. Denies leaking of fluid.   The following portions of the patient's history were reviewed and updated as appropriate: allergies, current medications, past family history, past medical history, past social history, past surgical history and problem list.   Objective:    Vitals:   05/30/24 1123  BP: 123/79  Pulse: 96  Weight: 218 lb 12.8 oz (99.2 kg)    Fetal Status:  Fetal Heart Rate (bpm): 148 Fundal Height: 36 cm Movement: Increased    General: Alert, oriented and cooperative. Patient is in no acute distress.  Skin: Skin is warm and dry. No rash noted.   Cardiovascular: Normal heart rate noted  Respiratory: Normal respiratory effort, no problems with respiration noted  Abdomen: Soft, gravid, appropriate for gestational age.  Pain/Pressure: Present     Pelvic: Cervical exam performed in the presence of a chaperone Dilation: 2.5 Effacement (%): 30 Station: -3  Extremities: Normal range of motion.  Edema: Trace  Mental Status: Normal mood and affect. Normal behavior. Normal judgment and thought content.   Assessment and Plan:  Pregnancy: H2E5975 at [redacted]w[redacted]d 1. Supervision of other normal pregnancy, antepartum (Primary) --Anticipatory guidance about next visits/weeks of pregnancy given.   - Cervicovaginal ancillary only( [place]) - Culture, beta strep  (group b only)  2. [redacted] weeks gestation of pregnancy   3. Gestational diabetes mellitus (GDM) in third trimester, gestational diabetes method of control unspecified --Pt reports taking glucose and normal values. Does not have log today to review.  4. Abnormal fetal ultrasound --US  today with MFM with elevated dopplers and abdominal circumference 12%, possibly overestimated per Dr Arna given fetal position.   --MFM recommends IOL at 37 weeks for growth restriction and elevated dopplers in the setting of GDM.   --Discussed with pt today who agrees with plan.  IOL scheduled for 06/03/24.  --Cervix 2.5 cm/30/-3, soft, mid position today so likely favorable for AROM or Pit induction. ----Reviewed labor readiness with patient including the [employer], evening primrose oil, and raspberry leaf tea.   --Orders placed  Term labor symptoms and general obstetric precautions including but not limited to vaginal bleeding, contractions, leaking of fluid and fetal movement were reviewed in detail with the patient. Please refer to After Visit Summary for other counseling recommendations.   No follow-ups on file.  Future Appointments  Date Time Provider Department Center  06/03/2024  6:30 AM [facility] SCHED ROOM [facility] None    Olam Boards, CNM

## 2024-05-31 ENCOUNTER — Encounter (HOSPITAL_COMMUNITY): Payer: Self-pay | Admitting: *Deleted

## 2024-05-31 ENCOUNTER — Telehealth (HOSPITAL_COMMUNITY): Payer: Self-pay | Admitting: *Deleted

## 2024-05-31 LAB — CERVICOVAGINAL ANCILLARY ONLY
Chlamydia: NEGATIVE
Comment: NEGATIVE
Comment: NEGATIVE
Comment: NORMAL
Neisseria Gonorrhea: NEGATIVE
Trichomonas: NEGATIVE

## 2024-05-31 NOTE — Telephone Encounter (Signed)
 Preadmission screen

## 2024-06-02 ENCOUNTER — Encounter: Payer: Self-pay | Admitting: Advanced Practice Midwife

## 2024-06-03 ENCOUNTER — Other Ambulatory Visit: Payer: Self-pay

## 2024-06-03 ENCOUNTER — Inpatient Hospital Stay (HOSPITAL_COMMUNITY): Admission: RE | Admit: 2024-06-03 | Source: Ambulatory Visit

## 2024-06-03 ENCOUNTER — Inpatient Hospital Stay (HOSPITAL_COMMUNITY): Admitting: Anesthesiology

## 2024-06-03 ENCOUNTER — Encounter (HOSPITAL_COMMUNITY): Payer: Self-pay | Admitting: Family Medicine

## 2024-06-03 ENCOUNTER — Inpatient Hospital Stay (HOSPITAL_COMMUNITY)
Admission: RE | Admit: 2024-06-03 | Discharge: 2024-06-05 | DRG: 807 | Disposition: A | Discharge status: Home / Self Care | Attending: Family Medicine | Admitting: Family Medicine

## 2024-06-03 DIAGNOSIS — Z3A37 37 weeks gestation of pregnancy: Secondary | ICD-10-CM

## 2024-06-03 DIAGNOSIS — O9902 Anemia complicating childbirth: Secondary | ICD-10-CM | POA: Diagnosis not present

## 2024-06-03 DIAGNOSIS — Z7951 Long term (current) use of inhaled steroids: Secondary | ICD-10-CM | POA: Diagnosis not present

## 2024-06-03 DIAGNOSIS — O365930 Maternal care for other known or suspected poor fetal growth, third trimester, not applicable or unspecified: Secondary | ICD-10-CM | POA: Diagnosis not present

## 2024-06-03 DIAGNOSIS — K219 Gastro-esophageal reflux disease without esophagitis: Secondary | ICD-10-CM | POA: Diagnosis not present

## 2024-06-03 DIAGNOSIS — O24419 Gestational diabetes mellitus in pregnancy, unspecified control: Secondary | ICD-10-CM

## 2024-06-03 DIAGNOSIS — O24420 Gestational diabetes mellitus in childbirth, diet controlled: Secondary | ICD-10-CM | POA: Diagnosis not present

## 2024-06-03 DIAGNOSIS — Z30017 Encounter for initial prescription of implantable subdermal contraceptive: Secondary | ICD-10-CM

## 2024-06-03 DIAGNOSIS — O283 Abnormal ultrasonic finding on antenatal screening of mother: Principal | ICD-10-CM | POA: Diagnosis present

## 2024-06-03 DIAGNOSIS — O24410 Gestational diabetes mellitus in pregnancy, diet controlled: Secondary | ICD-10-CM | POA: Diagnosis not present

## 2024-06-03 DIAGNOSIS — Z7982 Long term (current) use of aspirin: Secondary | ICD-10-CM | POA: Diagnosis not present

## 2024-06-03 DIAGNOSIS — Z148 Genetic carrier of other disease: Secondary | ICD-10-CM | POA: Diagnosis not present

## 2024-06-03 DIAGNOSIS — Z833 Family history of diabetes mellitus: Secondary | ICD-10-CM | POA: Diagnosis not present

## 2024-06-03 DIAGNOSIS — O9962 Diseases of the digestive system complicating childbirth: Secondary | ICD-10-CM | POA: Diagnosis not present

## 2024-06-03 DIAGNOSIS — Z8249 Family history of ischemic heart disease and other diseases of the circulatory system: Secondary | ICD-10-CM | POA: Diagnosis not present

## 2024-06-03 DIAGNOSIS — Z8616 Personal history of COVID-19: Secondary | ICD-10-CM | POA: Diagnosis not present

## 2024-06-03 LAB — CBC
HCT: 33.2 % — ABNORMAL LOW (ref 36.0–46.0)
Hemoglobin: 10.8 g/dL — ABNORMAL LOW (ref 12.0–15.0)
MCH: 26.2 pg (ref 26.0–34.0)
MCHC: 32.5 g/dL (ref 30.0–36.0)
MCV: 80.4 fL (ref 80.0–100.0)
Platelets: 423 10*3/uL — ABNORMAL HIGH (ref 150–400)
RBC: 4.13 MIL/uL (ref 3.87–5.11)
RDW: 15.8 % — ABNORMAL HIGH (ref 11.5–15.5)
WBC: 10.2 10*3/uL (ref 4.0–10.5)
nRBC: 0 % (ref 0.0–0.2)

## 2024-06-03 LAB — CULTURE, BETA STREP (GROUP B ONLY): Strep Gp B Culture: NEGATIVE

## 2024-06-03 LAB — GLUCOSE, CAPILLARY
Glucose-Capillary: 75 mg/dL (ref 70–99)
Glucose-Capillary: 67 mg/dL — ABNORMAL LOW (ref 70–99)

## 2024-06-03 LAB — TYPE AND SCREEN
ABO/RH(D): O POS
Antibody Screen: NEGATIVE

## 2024-06-03 MED ORDER — OXYTOCIN-SODIUM CHLORIDE 30-0.9 UT/500ML-% IV SOLN
2.5000 [IU]/h | INTRAVENOUS | Status: DC
  Filled 2024-06-03: qty 500

## 2024-06-03 MED ORDER — ONDANSETRON HCL 4 MG/2ML IJ SOLN: 4.0000 mg | Freq: Four times a day (QID) | INTRAMUSCULAR | Status: DC | PRN

## 2024-06-03 MED ORDER — SOD CITRATE-CITRIC ACID 500-334 MG/5ML PO SOLN: 30.0000 mL | ORAL | Status: DC | PRN

## 2024-06-03 MED ORDER — PHENYLEPHRINE 80 MCG/ML (10ML) SYRINGE FOR IV PUSH (FOR BLOOD PRESSURE SUPPORT): 80.0000 ug | PREFILLED_SYRINGE | INTRAVENOUS | Status: DC | PRN

## 2024-06-03 MED ORDER — TERBUTALINE SULFATE 1 MG/ML IJ SOLN: 0.2500 mg | Freq: Once | INTRAMUSCULAR | Status: DC | PRN

## 2024-06-03 MED ORDER — FENTANYL CITRATE (PF) 100 MCG/2ML IJ SOLN
100.0000 ug | Freq: Once | INTRAMUSCULAR | Status: AC
  Administered 2024-06-03: 100 ug via INTRAVENOUS

## 2024-06-03 MED ORDER — ACETAMINOPHEN 325 MG PO TABS: 650.0000 mg | ORAL_TABLET | ORAL | Status: DC | PRN

## 2024-06-03 MED ORDER — EPHEDRINE 5 MG/ML INJ: 10.0000 mg | INTRAVENOUS | Status: DC | PRN

## 2024-06-03 MED ORDER — OXYCODONE-ACETAMINOPHEN 5-325 MG PO TABS: 2.0000 | ORAL_TABLET | ORAL | Status: DC | PRN

## 2024-06-03 MED ORDER — FENTANYL-BUPIVACAINE-NACL 0.5-0.125-0.9 MG/250ML-% EP SOLN
12.0000 mL/h | EPIDURAL | Status: DC | PRN
  Administered 2024-06-03: 12 mL/h via EPIDURAL
  Filled 2024-06-03: qty 250

## 2024-06-03 MED ORDER — OXYTOCIN BOLUS FROM INFUSION
333.0000 mL | Freq: Once | INTRAVENOUS | Status: AC
  Administered 2024-06-03: 333 mL via INTRAVENOUS

## 2024-06-03 MED ORDER — LIDOCAINE HCL (PF) 1 % IJ SOLN: 30.0000 mL | INTRAMUSCULAR | Status: DC | PRN

## 2024-06-03 MED ORDER — SODIUM BICARBONATE 8.4 % IV SOLN
INTRAVENOUS | Status: DC | PRN
  Administered 2024-06-03: 3 mL via EPIDURAL

## 2024-06-03 MED ORDER — LACTATED RINGERS IV SOLN: 500.0000 mL | INTRAVENOUS | Status: DC | PRN

## 2024-06-03 MED ORDER — PHENYLEPHRINE 80 MCG/ML (10ML) SYRINGE FOR IV PUSH (FOR BLOOD PRESSURE SUPPORT)
80.0000 ug | PREFILLED_SYRINGE | INTRAVENOUS | Status: DC | PRN
  Filled 2024-06-03: qty 10

## 2024-06-03 MED ORDER — LACTATED RINGERS IV SOLN: INTRAVENOUS | Status: DC

## 2024-06-03 MED ORDER — OXYCODONE-ACETAMINOPHEN 5-325 MG PO TABS: 1.0000 | ORAL_TABLET | ORAL | Status: DC | PRN

## 2024-06-03 MED ORDER — OXYTOCIN-SODIUM CHLORIDE 30-0.9 UT/500ML-% IV SOLN
1.0000 m[IU]/min | INTRAVENOUS | Status: DC
  Administered 2024-06-03: 2 m[IU]/min via INTRAVENOUS
  Administered 2024-06-03: 10 m[IU]/min via INTRAVENOUS

## 2024-06-03 MED ORDER — FENTANYL CITRATE (PF) 100 MCG/2ML IJ SOLN
INTRAMUSCULAR | Status: AC
  Filled 2024-06-03: qty 2

## 2024-06-03 MED ORDER — LACTATED RINGERS IV SOLN: 500.0000 mL | Freq: Once | INTRAVENOUS | Status: DC

## 2024-06-03 MED ORDER — TRANEXAMIC ACID-NACL 1000-0.7 MG/100ML-% IV SOLN
1000.0000 mg | INTRAVENOUS | Status: AC
  Administered 2024-06-03: 1000 mg via INTRAVENOUS
  Filled 2024-06-03: qty 100

## 2024-06-03 MED ORDER — DIPHENHYDRAMINE HCL 50 MG/ML IJ SOLN: 12.5000 mg | INTRAMUSCULAR | Status: DC | PRN

## 2024-06-03 NOTE — Progress Notes (Signed)
 Discussed AROM r/b including increased risk of infection, cord prolapse, fetal intolerance, and decreased labor time. No questions or concerns and patient desires to proceed with AROM.  Moderate amount of clear fluid noted. Patient tolerated well.  Anticipate progression to active labor shortly.  Dilation: 4.5 Effacement (%): 70, 80 Station: -2 Presentation: Vertex Exam by:: Camie Regino Camie Regino, MSN, CNM, RNC-OB Certified Nurse Midwife, [facility] 06/03/2024 3:12 PM

## 2024-06-03 NOTE — Anesthesia Preprocedure Evaluation (Signed)
 Anesthesia Evaluation  Patient identified by MRN, date of birth, ID band Patient awake    Reviewed: Allergy & Precautions, Patient's Chart, lab work & pertinent test results  Airway Mallampati: III  TM Distance: >3 FB Neck ROM: Full    Dental no notable dental hx.    Pulmonary asthma    Pulmonary exam normal breath sounds clear to auscultation       Cardiovascular negative cardio ROS Normal cardiovascular exam Rhythm:Regular Rate:Normal     Neuro/Psych  Headaches    GI/Hepatic Neg liver ROS,GERD  Medicated,,  Endo/Other  diabetes    Renal/GU negative Renal ROS     Musculoskeletal negative musculoskeletal ROS (+)    Abdominal  (+) + obese  Peds  Hematology  (+) Blood dyscrasia, anemia   Anesthesia Other Findings   Reproductive/Obstetrics (+) Pregnancy                             Anesthesia Physical Anesthesia Plan  ASA: 3  Anesthesia Plan: Epidural   Post-op Pain Management:    Induction:   PONV Risk Score and Plan:   Airway Management Planned:   Additional Equipment:   Intra-op Plan:   Post-operative Plan:   Informed Consent: I have reviewed the patients History and Physical, chart, labs and discussed the procedure including the risks, benefits and alternatives for the proposed anesthesia with the patient or authorized representative who has indicated his/her understanding and acceptance.       Plan Discussed with:   Anesthesia Plan Comments:        Anesthesia Quick Evaluation

## 2024-06-03 NOTE — Discharge Summary (Signed)
 Postpartum Discharge Summary  Date of Service updated***     Patient Name: Jillian Bishop DOB: [birth year] MRN: [id number]  Date of admission: 06/03/2024 Delivery date:06/03/2024 Delivering provider: CLEATUS MOCCASIN Date of discharge: 06/03/2024  Admitting diagnosis: Abnormal fetal ultrasound [O28.3] Intrauterine pregnancy: [redacted]w[redacted]d     Secondary diagnosis:  Principal Problem:   Abnormal fetal ultrasound  Additional problems: Fetal growth restriction, A1GDM    Discharge diagnosis: Term Pregnancy Delivered and GDM A1                                              Post partum procedures:{Postpartum procedures:23558} Augmentation: AROM, Pitocin , and IP Foley Complications: {OB Labor/Delivery Complications:20784}  Hospital course: Induction of Labor With Vaginal Delivery   28 y.o. yo H2E5975 at [redacted]w[redacted]d was admitted to the hospital 06/03/2024 for induction of labor.  Indication for induction: A1 DM and FGR that had improved.  Patient had an labor course that was uncomplicated.  Membrane Rupture Time/Date: 2:59 PM,06/03/2024  Delivery Method:Vaginal, Spontaneous Operative Delivery:N/A Episiotomy: None Lacerations:    Details of delivery can be found in separate delivery note.  Patient had a postpartum course complicated by***. Patient is discharged home 06/03/24.  Newborn Data: Birth date:06/03/2024 Birth time:10:35 PM Gender:Female Living status:  Apgars: ,  Weight:   Magnesium Sulfate received: {Mag received:30440022} BMZ received: {BMZ received:30440023} Rhophylac:{Rhophylac received:30440032} FFM:{FFM:69559966} T-DaP:{Tdap:23962} Flu: {Qol:76036} RSV Vaccine received: {RSV:31013} Transfusion:{Transfusion received:30440034}  Immunizations received: Immunization History  Administered Date(s) Administered   HPV 9-valent 08/27/2020   Influenza,inj,Quad PF,6+ Mos 08/28/2015, 10/02/2016, 10/04/2018, 09/28/2019, 09/15/2022   Influenza-Unspecified 09/15/2021   PFIZER(Purple  Top)SARS-COV-2 Vaccination 07/10/2020, 08/01/2020   PPD Test 06/12/2022   Tdap 10/21/2015, 02/10/2017, 12/15/2019, 04/06/2024    Physical exam  Vitals:   06/03/24 2020 06/03/24 2025 06/03/24 2030 06/03/24 2100  BP: 114/73 122/68 118/71 131/68  Pulse: 95 99 (!) 111 85  Resp:      Temp:      TempSrc:      SpO2:      Weight:      Height:       General: {Exam; general:21111117} Lochia: {Desc; appropriate/inappropriate:30686::appropriate} Uterine Fundus: {Desc; firm/soft:30687} Incision: {Exam; incision:21111123} DVT Evaluation: {Exam; dvt:2111122} Labs: Lab Results  Component Value Date   WBC 10.2 06/03/2024   HGB 10.8 (L) 06/03/2024   HCT 33.2 (L) 06/03/2024   MCV 80.4 06/03/2024   PLT 423 (H) 06/03/2024      Latest Ref Rng & Units 05/04/2024    9:31 AM  CMP  Glucose 70 - 99 mg/dL 73   BUN 6 - 20 mg/dL 8   Creatinine 9.42 - 8.99 mg/dL 9.46   Sodium 865 - 855 mmol/L 135   Potassium 3.5 - 5.2 mmol/L 4.5   Chloride 96 - 106 mmol/L 103   CO2 20 - 29 mmol/L 18   Calcium 8.7 - 10.2 mg/dL 8.6   Total Protein 6.0 - 8.5 g/dL 5.8   Total Bilirubin 0.0 - 1.2 mg/dL <9.7   Alkaline Phos 44 - 121 IU/L 66   AST 0 - 40 IU/L 21   ALT 0 - 32 IU/L 16    Edinburgh Score:    03/12/2020   10:08 AM  Edinburgh Postnatal Depression Scale Screening Tool  I have been able to laugh and see the funny side of things. 0  I have looked forward with  enjoyment to things. 0  I have blamed myself unnecessarily when things went wrong. 0  I have been anxious or worried for no good reason. 0  I have felt scared or panicky for no good reason. 0  Things have been getting on top of me. 0  I have been so unhappy that I have had difficulty sleeping. 0  I have felt sad or miserable. 0  I have been so unhappy that I have been crying. 0  The thought of harming myself has occurred to me. 0  Edinburgh Postnatal Depression Scale Total 0      Data saved with a previous flowsheet row definition   No data  recorded  After visit meds:  Allergies as of 06/03/2024   No Known Allergies   Med Rec must be completed prior to using this [facility]***        Discharge home in stable condition Infant Feeding: Bottle and Breast Infant Disposition:{CHL IP OB HOME WITH FNUYZM:76418} Discharge instruction: per After Visit Summary and Postpartum booklet. Activity: Advance as tolerated. Pelvic rest for 6 weeks.  Diet: routine diet Future Appointments:No future appointments. Follow up Visit:  Follow-up Information     [facility] Follow up in 6 week(s).   Specialty: Obstetrics and Gynecology Contact information: [address], [address] [place]  [address] [phone]               Msg sent to [facility] on 6/28 by Dr. Cleatus  Please schedule this patient for a In person postpartum visit in 6 weeks with the following provider: Any provider. Additional Postpartum F/U:2 hour GTT  High risk pregnancy complicated by: GDM Delivery mode:  Vaginal, Spontaneous Anticipated Birth Control:  {Birth Control:23956}   06/03/2024 Vina Cleatus, MD

## 2024-06-03 NOTE — Anesthesia Procedure Notes (Signed)
 Epidural Patient location during procedure: OB  Staffing Anesthesiologist: Darlyn Rush, MD Performed: anesthesiologist   Preanesthetic Checklist Completed: patient identified, IV checked, risks and benefits discussed, monitors and equipment checked, pre-op evaluation and timeout performed  Epidural Patient position: sitting Prep: DuraPrep and site prepped and draped Patient monitoring: heart rate, continuous pulse ox and blood pressure Approach: midline Location: L3-L4 Injection technique: LOR air and LOR saline  Needle:  Needle type: Tuohy  Needle gauge: 17 G Needle length: 9 cm Needle insertion depth: 9 cm Catheter type: closed end flexible Catheter size: 19 Gauge Catheter at skin depth: 15 cm Test dose: negative  Assessment Sensory level: T8 Events: blood not aspirated, no cerebrospinal fluid, injection not painful, no injection resistance, no paresthesia and negative IV test  Additional Notes Reason for block:procedure for pain

## 2024-06-03 NOTE — H&P (Signed)
 OBSTETRIC ADMISSION HISTORY AND PHYSICAL  Jillian Bishop is a 28 y.o. female [phone] with IUP at [redacted]w[redacted]d by 7 weeks US  presenting for IOL 2/2 A1GDM. She reports +FMs, No LOF, no VB, no blurry vision, headaches or peripheral edema, and RUQ pain.  She plans on BF/Btl feeding. She request Nexplanon for birth control. She received her prenatal care at [facility]   Dating: By 7 week US  --->  Estimated Date of Delivery: 06/24/24  Sono:    @[redacted]w[redacted]d , CWD, normal anatomy, breech presentation, anterior fundal lie, 306g, 51% EFW   Prenatal History/Complications: A1GDM  Past Medical History: Past Medical History:  Diagnosis Date   Asthma    mild well controlled, no inhaler use since 2016   Benign teratoma of ovary, right    COVID-19 11/02/2019   sore throat cough, sob loss of taste and smell, cough until 03-11-2020 all symptoms resolved now   Dermoid cyst    GERD (gastroesophageal reflux disease)    Headache    tension, occ migraine   History of postpartum hemorrhage, currently pregnant    HPV (human papilloma virus) infection     Past Surgical History: Past Surgical History:  Procedure Laterality Date   APPENDECTOMY  age 48 or 3   DILATION AND EVACUATION N/A 11/19/2018   Procedure: DILATATION AND EVACUATION;  Surgeon: Estelle Service, MD;  Location: [facility];  Service: Gynecology;  Laterality: N/A;   LAPAROSCOPIC OVARIAN CYSTECTOMY Right 09/05/2020   Procedure: LAPAROSCOPIC OVARIAN CYSTECTOMY LYSIS OF ADHESISONS;  Surgeon: Delana Ted Morrison, DO;  Location: [facility];  Service: Gynecology;  Laterality: Right;    Obstetrical History: OB History     Gravida  7   Para  4   Term  4   Preterm      AB  2   Living  4      SAB  2   IAB      Ectopic      Multiple  0   Live Births  4           Social History Social History   Socioeconomic History   Marital status: Single    Spouse name: Not on file   Number of children: Not on file   Years of  education: Not on file   Highest education level: 12th grade  Occupational History   Not on file  Tobacco Use   Smoking status: Never    Passive exposure: Never   Smokeless tobacco: Never  Vaping Use   Vaping status: Never Used  Substance and Sexual Activity   Alcohol use: Not Currently   Drug use: No   Sexual activity: Not Currently    Birth control/protection: None  Other Topics Concern   Not on file  Social History Narrative   Not on file   Social Drivers of Health   Financial Resource Strain: Low Risk  (10/19/2023)   Overall Financial Resource Strain (CARDIA)    Difficulty of Paying Living Expenses: Not hard at all  Food Insecurity: No Food Insecurity (10/19/2023)   Hunger Vital Sign    Worried About Running Out of Food in the Last Year: Never true    Ran Out of Food in the Last Year: Never true  Transportation Needs: No Transportation Needs (10/19/2023)   PRAPARE - Administrator, Civil Service (Medical): No    Lack of Transportation (Non-Medical): No  Physical Activity: Sufficiently Active (10/19/2023)   Exercise Vital Sign    Days  of Exercise per Week: 3 days    Minutes of Exercise per Session: 60 min  Stress: Stress Concern Present (10/19/2023)   [name] of Occupational Health - Occupational Stress Questionnaire    Feeling of Stress : Rather much  Social Connections: Moderately Integrated (10/19/2023)   Social Connection and Isolation Panel    Frequency of Communication with Friends and Family: More than three times a week    Frequency of Social Gatherings with Friends and Family: Twice a week    Attends Religious Services: More than 4 times per year    Active Member of [employer] or Organizations: No    Attends Engineer, structural: Not on file    Marital Status: Living with partner    Family History: Family History  Problem Relation Age of Onset   Hypertension Mother    Diabetes Mother    Hypertension Father    Diabetes Father     Hypertension Sister    Hypertension Sister    Diabetes Sister    Diabetes Maternal Aunt    Diabetes Maternal Grandmother    Hypertension Maternal Grandmother    Heart disease Paternal Grandmother    Hypertension Paternal Grandmother     Allergies: No Known Allergies  Medications Prior to Admission  Medication Sig Dispense Refill Last Dose/Taking   Accu-Chek Softclix Lancets lancets Please check your blood sugar 4 times daily. Once in the morning when waking up (fasting), and 2 hours after breakfast, lunch, and dinner. 100 each 12    albuterol  (VENTOLIN  HFA) 108 (90 Base) MCG/ACT inhaler Inhale 2 puffs into the lungs every 6 (six) hours as needed for wheezing or shortness of breath. 8 g 2    aspirin  EC 81 MG tablet Take 1 tablet (81 mg total) by mouth daily. 60 tablet 2    Blood Glucose Monitoring Suppl (ACCU-CHEK GUIDE) w/Device KIT 1 Device by Does not apply route 4 (four) times daily. Please check your blood sugar 4 times daily. Once in the morning when waking up (fasting), and 2 hours after breakfast, lunch, and dinner. 1 kit 0    budesonide -formoterol  (SYMBICORT ) 160-4.5 MCG/ACT inhaler Inhale 2 puffs into the lungs in the morning and at bedtime. 1 each 12    famotidine  (PEPCID ) 20 MG tablet Take 1 tablet (20 mg total) by mouth 2 (two) times daily. 60 tablet 3    ferrous sulfate  325 (65 FE) MG tablet Take 1 tablet (325 mg total) by mouth every other day. 90 tablet 1    glucose blood (ACCU-CHEK GUIDE TEST) test strip Please check your blood sugar 4 times daily. Once in the morning when waking up (fasting), and 2 hours after breakfast, lunch, and dinner. 100 each 12    metroNIDAZOLE  (METROGEL ) 0.75 % vaginal gel Place 1 Applicatorful vaginally at bedtime. Apply one applicatorful to vagina at bedtime for 5 days (Patient not taking: Reported on 05/30/2024) 70 g 1    Prenatal Vit-Fe Fumarate-FA (PREPLUS) 27-1 MG TABS Take 1 tablet by mouth daily. 30 tablet 13    SUMAtriptan  (IMITREX ) 100  MG tablet Take 1 tablet (100 mg total) by mouth once as needed for up to 1 dose for migraine. May repeat in 2 hours if headache persists or recurs. (Patient not taking: Reported on 05/30/2024) 9 tablet 11      Review of Systems   All systems reviewed and negative except as stated in HPI  Last menstrual period 09/18/2023. General appearance: alert, cooperative, and appears stated age Lungs: clear  to auscultation bilaterally Heart: regular rate and rhythm Abdomen: soft, non-tender; bowel sounds normal Pelvic: adequate Extremities: Homans sign is negative, no sign of DVT DTR's +2 Presentation: cephalic Fetal monitoringBaseline: 165 bpm, Variability: Good {> 6 bpm), Accelerations: Reactive, and Decelerations: Absent Uterine activityFrequency: Occasional, Duration: 60-90 seconds, and Intensity: mild     Prenatal labs: ABO, Rh: O/Positive/-- (12/04 0923) Antibody: Negative (12/04 0923) Rubella: 1.57 (12/04 0923) RPR: Non Reactive (05/01 0914)  HBsAg: Negative (12/04 0923)  HIV: Non Reactive (05/01 0914)  GBS: Negative/-- (06/24 1209)    Lab Results  Component Value Date   GBS Negative 05/30/2024   GTT early elevated Hgb A1c Genetic screening  LR female Anatomy US  WNL  Immunization History  Administered Date(s) Administered   HPV 9-valent 08/27/2020   Influenza,inj,Quad PF,6+ Mos 08/28/2015, 10/02/2016, 10/04/2018, 09/28/2019, 09/15/2022   Influenza-Unspecified 09/15/2021   PFIZER(Purple Top)SARS-COV-2 Vaccination 07/10/2020, 08/01/2020   PPD Test 06/12/2022   Tdap 10/21/2015, 02/10/2017, 12/15/2019, 04/06/2024    Prenatal Transfer Tool  Maternal Diabetes: Yes:  Diabetes Type:  Diet controlled Genetic Screening: Normal Maternal Ultrasounds/Referrals: Normal Fetal Ultrasounds or other Referrals:  None Maternal Substance Abuse:  No Significant Maternal Medications:  None Significant Maternal Lab Results: Group B Strep negative Number of Prenatal Visits:greater than 3  verified prenatal visits Maternal Vaccinations:TDap and Covid Other Comments:  None   No results found for this or any previous visit (from the past 24 hours).  Patient Active Problem List   Diagnosis Date Noted   GDM (gestational diabetes mellitus) 04/07/2024   Anemia 04/07/2024   Alpha thalassemia silent carrier 12/16/2023   Supervision of other normal pregnancy, antepartum 11/10/2023   BMI 34.0-34.9,adult 06/02/2023   Vitamin D  deficiency 06/19/2022   Asthma 06/18/2022   Chronic migraine w/o aura w/o status migrainosus, not intractable 12/23/2021   Family history of brain aneurysm 12/23/2021    Assessment/Plan:  Jillian Bishop is a 28 y.o. H2E5975 at [redacted]w[redacted]d here for IOL for A1GDM  #Labor:IOL in latent phase. RBA of FB placement and titration of pitocin  d/w patient. Patient agrees to this intervention. Placed easily, tolerated well. Balloon filled with 50cc and tension applied. Consider AROM once expelled.   #Pain: Coping well, has had several unmedicated births.  #FWB: Cat 1 #GBS status:  negative #Feeding: Breastmilk  #Reproductive Life planning: Nexplanon #Circ:  yes  Camie DELENA Rote, CNM  06/03/2024, 12:59 PM

## 2024-06-04 ENCOUNTER — Encounter (HOSPITAL_COMMUNITY): Payer: Self-pay | Admitting: Family Medicine

## 2024-06-04 LAB — GLUCOSE, CAPILLARY: Glucose-Capillary: 64 mg/dL — ABNORMAL LOW (ref 70–99)

## 2024-06-04 LAB — RPR: RPR Ser Ql: NONREACTIVE

## 2024-06-04 MED ORDER — BENZOCAINE-MENTHOL 20-0.5 % EX AERO
1.0000 | INHALATION_SPRAY | CUTANEOUS | Status: DC | PRN
  Administered 2024-06-04: 1 via TOPICAL
  Filled 2024-06-04: qty 56

## 2024-06-04 MED ORDER — COCONUT OIL OIL: 1.0000 | TOPICAL_OIL | Status: DC | PRN

## 2024-06-04 MED ORDER — PRENATAL MULTIVITAMIN CH
1.0000 | ORAL_TABLET | Freq: Every day | ORAL | Status: DC
  Administered 2024-06-04 – 2024-06-05 (×2): 1 via ORAL
  Filled 2024-06-04 (×2): qty 1

## 2024-06-04 MED ORDER — SENNOSIDES-DOCUSATE SODIUM 8.6-50 MG PO TABS
2.0000 | ORAL_TABLET | Freq: Every day | ORAL | Status: DC
Start: 2024-06-04 — End: 2024-06-05
  Filled 2024-06-04 (×2): qty 2

## 2024-06-04 MED ORDER — TETANUS-DIPHTH-ACELL PERTUSSIS 5-2.5-18.5 LF-MCG/0.5 IM SUSY: 0.5000 mL | PREFILLED_SYRINGE | Freq: Once | INTRAMUSCULAR | Status: DC

## 2024-06-04 MED ORDER — OXYCODONE HCL 5 MG PO TABS: 10.0000 mg | ORAL_TABLET | ORAL | Status: DC | PRN

## 2024-06-04 MED ORDER — ONDANSETRON HCL 4 MG PO TABS: 4.0000 mg | ORAL_TABLET | ORAL | Status: DC | PRN

## 2024-06-04 MED ORDER — WITCH HAZEL-GLYCERIN EX PADS: 1.0000 | MEDICATED_PAD | CUTANEOUS | Status: DC | PRN

## 2024-06-04 MED ORDER — ONDANSETRON HCL 4 MG/2ML IJ SOLN: 4.0000 mg | INTRAMUSCULAR | Status: DC | PRN

## 2024-06-04 MED ORDER — OXYCODONE HCL 5 MG PO TABS: 5.0000 mg | ORAL_TABLET | ORAL | Status: DC | PRN

## 2024-06-04 MED ORDER — SIMETHICONE 80 MG PO CHEW: 80.0000 mg | CHEWABLE_TABLET | ORAL | Status: DC | PRN

## 2024-06-04 MED ORDER — ACETAMINOPHEN 325 MG PO TABS
650.0000 mg | ORAL_TABLET | ORAL | Status: DC | PRN
  Administered 2024-06-04 (×2): 650 mg via ORAL
  Filled 2024-06-04 (×2): qty 2

## 2024-06-04 MED ORDER — ZOLPIDEM TARTRATE 5 MG PO TABS: 5.0000 mg | ORAL_TABLET | Freq: Every evening | ORAL | Status: DC | PRN

## 2024-06-04 MED ORDER — DIPHENHYDRAMINE HCL 25 MG PO CAPS: 25.0000 mg | ORAL_CAPSULE | Freq: Four times a day (QID) | ORAL | Status: DC | PRN

## 2024-06-04 MED ORDER — DIBUCAINE (PERIANAL) 1 % EX OINT: 1.0000 | TOPICAL_OINTMENT | CUTANEOUS | Status: DC | PRN

## 2024-06-04 MED ORDER — IBUPROFEN 600 MG PO TABS
600.0000 mg | ORAL_TABLET | Freq: Four times a day (QID) | ORAL | Status: DC
Start: 2024-06-04 — End: 2024-06-05
  Administered 2024-06-04 – 2024-06-05 (×6): 600 mg via ORAL
  Filled 2024-06-04 (×6): qty 1

## 2024-06-04 NOTE — Anesthesia Postprocedure Evaluation (Signed)
 Anesthesia Post Note  Patient: RHEMA BOYETT  Procedure(s) Performed: AN AD HOC LABOR EPIDURAL     Patient location during evaluation: Mother Baby Anesthesia Type: Epidural Level of consciousness: awake and alert Pain management: pain level controlled Vital Signs Assessment: post-procedure vital signs reviewed and stable Respiratory status: spontaneous breathing, nonlabored ventilation and respiratory function stable Cardiovascular status: stable Postop Assessment: no headache, no backache and epidural receding Anesthetic complications: no   No notable events documented.  Last Vitals:  Vitals:   06/04/24 0545 06/04/24 0829  BP: 125/75 (!) 114/59  Pulse: 89 80  Resp: 18 18  Temp: 36.8 C 36.6 C  SpO2: 100% 96%    Last Pain:  Vitals:   06/04/24 0829  TempSrc: Oral  PainSc: 3    Pain Goal:                   Chadwick Reiswig

## 2024-06-04 NOTE — Lactation Note (Signed)
 This note was copied from a baby's chart. Lactation Consultation Note  Patient Name: Jillian Bishop Date: 06/04/2024 Age:30 hours  Per [name], she is experienced with breastfeeding and  infant is latching well. [name] declined LC services on MBU.    Maternal Data    Feeding    LATCH Score Latch: Grasps breast easily, tongue down, lips flanged, rhythmical sucking.  Audible Swallowing: Spontaneous and intermittent  Type of Nipple: Everted at rest and after stimulation  Comfort (Breast/Nipple): Soft / non-tender  Hold (Positioning): No assistance needed to correctly position infant at breast.  LATCH Score: 10   Lactation Tools Discussed/Used    Interventions    Discharge    Consult Status      [name] 06/04/2024, 1:43 AM

## 2024-06-05 ENCOUNTER — Encounter (HOSPITAL_COMMUNITY): Payer: Self-pay | Admitting: Family Medicine

## 2024-06-05 HISTORY — PX: PR INSERTION DRUG DELIVERY IMPLANT: 11981

## 2024-06-05 MED ORDER — IBUPROFEN 600 MG PO TABS: 600.0000 mg | ORAL_TABLET | Freq: Four times a day (QID) | ORAL | 0 refills | Status: AC

## 2024-06-05 MED ORDER — LIDOCAINE HCL 1 % IJ SOLN
0.0000 mL | Freq: Once | INTRAMUSCULAR | Status: AC | PRN
  Administered 2024-06-05: 10 mL via INTRADERMAL
  Filled 2024-06-05: qty 20

## 2024-06-05 MED ORDER — ETONOGESTREL 68 MG ~~LOC~~ IMPL
68.0000 mg | DRUG_IMPLANT | Freq: Once | SUBCUTANEOUS | Status: AC
  Administered 2024-06-05: 68 mg via SUBCUTANEOUS
  Filled 2024-06-05: qty 1

## 2024-06-05 MED ORDER — ACETAMINOPHEN 325 MG PO TABS: 650.0000 mg | ORAL_TABLET | ORAL | 0 refills | Status: AC | PRN

## 2024-06-05 MED ORDER — SENNOSIDES-DOCUSATE SODIUM 8.6-50 MG PO TABS: 2.0000 | ORAL_TABLET | Freq: Every day | ORAL | 0 refills | Status: AC

## 2024-06-05 NOTE — Procedures (Signed)
 Nexplanon Insertion PROCEDURE NOTE Ms. AMANPREET DELMONT is a 28 y.o. H2E4974 here for Nexplanon insertion. Last pap smear was on 06/04/23 and was normal.  No other gynecologic concerns.  Patient was given informed consent, she signed consent form.  Patient does understand that irregular bleeding is a very common side effect of this medication. She was advised to have backup contraception for one week after placement.  Appropriate time out taken.  Patient's left arm was prepped and draped in the usual sterile fashion.. The ruler used to measure and mark insertion area.  Patient was prepped with alcohol swab and then injected with 10 ml of 1% lidocaine .  She was prepped with betadine , Nexplanon removed from packaging,  Device confirmed in needle, then inserted full length of needle and withdrawn per handbook instructions. Nexplanon was able to palpated in the patient's arm; patient palpated the insert herself. There was minimal blood loss.  Patient insertion site covered with guaze and a pressure bandage to reduce any bruising.  The patient tolerated the procedure well and was given post procedure instructions.   Augustin JAYSON Slade 06/05/2024 9:59 AM

## 2024-06-05 NOTE — Plan of Care (Signed)
 Problem: Education: Goal: Knowledge of General Education information will improve Description: Including pain rating scale, medication(s)/side effects and non-pharmacologic comfort measures Outcome: Adequate for Discharge   Problem: Health Behavior/Discharge Planning: Goal: Ability to manage health-related needs will improve Outcome: Adequate for Discharge   Problem: Clinical Measurements: Goal: Ability to maintain clinical measurements within normal limits will improve Outcome: Adequate for Discharge Goal: Will remain free from infection Outcome: Adequate for Discharge Goal: Diagnostic test results will improve Outcome: Adequate for Discharge Goal: Respiratory complications will improve Outcome: Adequate for Discharge Goal: Cardiovascular complication will be avoided Outcome: Adequate for Discharge   Problem: Activity: Goal: Risk for activity intolerance will decrease Outcome: Adequate for Discharge   Problem: Nutrition: Goal: Adequate nutrition will be maintained Outcome: Adequate for Discharge   Problem: Coping: Goal: Level of anxiety will decrease Outcome: Adequate for Discharge   Problem: Elimination: Goal: Will not experience complications related to bowel motility Outcome: Adequate for Discharge Goal: Will not experience complications related to urinary retention Outcome: Adequate for Discharge   Problem: Pain Managment: Goal: General experience of comfort will improve and/or be controlled Outcome: Adequate for Discharge   Problem: Safety: Goal: Ability to remain free from injury will improve Outcome: Adequate for Discharge   Problem: Skin Integrity: Goal: Risk for impaired skin integrity will decrease Outcome: Adequate for Discharge   Problem: Education: Goal: Knowledge of Childbirth will improve Outcome: Adequate for Discharge Goal: Ability to make informed decisions regarding treatment and plan of care will improve Outcome: Adequate for  Discharge Goal: Ability to state and carry out methods to decrease the pain will improve Outcome: Adequate for Discharge Goal: Individualized Educational Video(s) Outcome: Adequate for Discharge   Problem: Coping: Goal: Ability to verbalize concerns and feelings about labor and delivery will improve Outcome: Adequate for Discharge   Problem: Life Cycle: Goal: Ability to make normal progression through stages of labor will improve Outcome: Adequate for Discharge Goal: Ability to effectively push during vaginal delivery will improve Outcome: Adequate for Discharge   Problem: Role Relationship: Goal: Will demonstrate positive interactions with the child Outcome: Adequate for Discharge   Problem: Safety: Goal: Risk of complications during labor and delivery will decrease Outcome: Adequate for Discharge   Problem: Pain Management: Goal: Relief or control of pain from uterine contractions will improve Outcome: Adequate for Discharge   Problem: Education: Goal: Knowledge of condition will improve Outcome: Adequate for Discharge Goal: Individualized Educational Video(s) Outcome: Adequate for Discharge Goal: Individualized Newborn Educational Video(s) Outcome: Adequate for Discharge   Problem: Activity: Goal: Will verbalize the importance of balancing activity with adequate rest periods Outcome: Adequate for Discharge Goal: Ability to tolerate increased activity will improve Outcome: Adequate for Discharge   Problem: Coping: Goal: Ability to identify and utilize available resources and services will improve Outcome: Adequate for Discharge   Problem: Life Cycle: Goal: Chance of risk for complications during the postpartum period will decrease Outcome: Adequate for Discharge   Problem: Role Relationship: Goal: Ability to demonstrate positive interaction with newborn will improve Outcome: Adequate for Discharge   Problem: Skin Integrity: Goal: Demonstration of wound healing  without infection will improve Outcome: Adequate for Discharge   Problem: Education: Goal: Knowledge of condition will improve Outcome: Adequate for Discharge Goal: Individualized Educational Video(s) Outcome: Adequate for Discharge Goal: Individualized Newborn Educational Video(s) Outcome: Adequate for Discharge   Problem: Activity: Goal: Will verbalize the importance of balancing activity with adequate rest periods Outcome: Adequate for Discharge Goal: Ability to tolerate increased activity will improve Outcome:  Adequate for Discharge   Problem: Coping: Goal: Ability to identify and utilize available resources and services will improve Outcome: Adequate for Discharge   Problem: Life Cycle: Goal: Chance of risk for complications during the postpartum period will decrease Outcome: Adequate for Discharge   Problem: Role Relationship: Goal: Ability to demonstrate positive interaction with newborn will improve Outcome: Adequate for Discharge   Problem: Skin Integrity: Goal: Demonstration of wound healing without infection will improve Outcome: Adequate for Discharge

## 2024-06-06 ENCOUNTER — Telehealth: Payer: Self-pay | Admitting: Advanced Practice Midwife

## 2024-06-06 NOTE — Telephone Encounter (Signed)
 Pt came into the office and dropped off FMLA forms to be completed by the provider. [employer] and would like for them to be faxed to [phone]  and they have a number of [phone].  Pt paid with a Visa card the $15.00 form fee to have it completed.  Placed in nurse box to be completed by the provider.

## 2024-06-14 ENCOUNTER — Telehealth (HOSPITAL_COMMUNITY): Payer: Self-pay | Admitting: *Deleted

## 2024-06-14 NOTE — Telephone Encounter (Signed)
 06/14/2024  Name: Jillian Bishop MRN: [id number] DOB: 10-May-1996  Reason for Call:  Transition of Care Hospital Discharge Call  Contact Status: Patient Contact Status: Complete  Language assistant needed:          Follow-Up Questions: Do You Have Any Concerns About Your Health As You Heal From Delivery?: No Do You Have Any Concerns About Your Infants Health?: No  Edinburgh Postnatal Depression Scale:  In the Past 7 Days: I have been able to laugh and see the funny side of things.: As much as I always could I have looked forward with enjoyment to things.: As much as I ever did I have blamed myself unnecessarily when things went wrong.: No, never I have been anxious or worried for no good reason.: No, not at all I have felt scared or panicky for no good reason.: No, not at all Things have been getting on top of me.: No, I have been coping as well as ever I have been so unhappy that I have had difficulty sleeping.: Not at all I have felt sad or miserable.: No, not at all I have been so unhappy that I have been crying.: No, never The thought of harming myself has occurred to me.: Never [place] Postnatal Depression Scale Total: 0  PHQ2-9 Depression Scale:     Discharge Follow-up: Edinburgh score requires follow up?: No Patient was advised of the following resources:: Breastfeeding Support Group, Support Group  Post-discharge interventions: Reviewed Newborn Safe Sleep Practices  Signature Allean ivar Carton, RN, 06/14/24, [phone]

## 2024-07-03 ENCOUNTER — Encounter: Payer: Self-pay | Admitting: Advanced Practice Midwife

## 2024-07-04 ENCOUNTER — Telehealth: Payer: Self-pay

## 2024-07-04 NOTE — Telephone Encounter (Signed)
 Patient call she was scheduled for her initial visit on 11/2023. She had to cancel because she was pregnant. Can she be scheduled as a restart. Please advise

## 2024-07-06 ENCOUNTER — Ambulatory Visit: Admitting: Obstetrics and Gynecology

## 2024-07-14 ENCOUNTER — Encounter: Payer: Self-pay | Admitting: Obstetrics and Gynecology

## 2024-07-14 ENCOUNTER — Other Ambulatory Visit

## 2024-07-14 ENCOUNTER — Ambulatory Visit: Admitting: Obstetrics and Gynecology

## 2024-07-14 ENCOUNTER — Ambulatory Visit: Admitting: Obstetrics

## 2024-07-14 ENCOUNTER — Encounter: Payer: Self-pay | Admitting: Family

## 2024-07-14 DIAGNOSIS — O24419 Gestational diabetes mellitus in pregnancy, unspecified control: Secondary | ICD-10-CM | POA: Diagnosis not present

## 2024-07-14 DIAGNOSIS — Z3009 Encounter for other general counseling and advice on contraception: Secondary | ICD-10-CM

## 2024-07-14 DIAGNOSIS — Z3046 Encounter for surveillance of implantable subdermal contraceptive: Secondary | ICD-10-CM | POA: Diagnosis not present

## 2024-07-14 NOTE — Progress Notes (Signed)
 Post Partum Visit Note  Jillian Bishop is a 28 y.o. [phone] female who presents for a postpartum visit. She is 5 weeks postpartum following a normal spontaneous vaginal delivery.  I have fully reviewed the prenatal and intrapartum course. The delivery was at 37.0 gestational weeks.  Anesthesia: epidural. Postpartum course has been unremarkable. Baby is doing well. Baby is feeding by both breast and bottle - Enfamil Nutramigen. Bleeding moderate lochia. Bowel function is normal. Bladder function is normal. Patient is not sexually active. Contraception method is Nexplanon . Postpartum depression screening: negative.   Upstream - 07/14/24 0900       Pregnancy Intention Screening   Does the patient want to become pregnant in the next year? No    Does the patient's partner want to become pregnant in the next year? No    Would the patient like to discuss contraceptive options today? Yes      Contraception Wrap Up   Current Method Hormonal Implant         The pregnancy intention screening data noted above was reviewed. Potential methods of contraception were discussed. The patient elected to proceed with No data recorded.   Edinburgh Postnatal Depression Scale - 07/14/24 0859       Edinburgh Postnatal Depression Scale:  In the Past 7 Days   I have been able to laugh and see the funny side of things. 0    I have looked forward with enjoyment to things. 0    I have blamed myself unnecessarily when things went wrong. 0    I have been anxious or worried for no good reason. 0    I have felt scared or panicky for no good reason. 0    Things have been getting on top of me. 0    I have been so unhappy that I have had difficulty sleeping. 0    I have felt sad or miserable. 0    I have been so unhappy that I have been crying. 0    The thought of harming myself has occurred to me. 0    Edinburgh Postnatal Depression Scale Total 0          Health Maintenance Due  Topic Date Due    Pneumococcal Vaccine: 19-49 Years (1 of 2 - PCV) Never done   Hepatitis B Vaccines (1 of 3 - 19+ 3-dose series) Never done   HPV VACCINES (2 - Risk 3-dose series) 09/24/2020   COVID-19 Vaccine (3 - 2024-25 season) 08/08/2023   INFLUENZA VACCINE  07/07/2024    The following portions of the patient's history were reviewed and updated as appropriate: allergies, current medications, past family history, past medical history, past social history, past surgical history, and problem list.  Review of Systems Pertinent items are noted in HPI.  Objective:  BP 114/74   Pulse 69   Ht 5' 3 (1.6 m)   Wt 206 lb (93.4 kg)   LMP 09/18/2023 (Approximate)   Breastfeeding Yes   BMI 36.49 kg/m    General:  alert, cooperative, and no distress   Breasts:  not indicated  Lungs: clear to auscultation bilaterally  Heart:  regular rate and rhythm, S1, S2 normal, no murmur, click, rub or gallop  Abdomen: soft, non-tender; bowel sounds normal; no masses,  no organomegaly   Wound N/A  GU exam:  not indicated       Assessment:   1. Postpartum state (Primary) Doing well   2. Gestational diabetes mellitus (GDM)  in third trimester, gestational diabetes method of control unspecified Completed PP GTT today   3. Encounter for counseling regarding contraception Reviewed all forms of birth control options available including abstinence; over the counter/barrier methods; hormonal contraceptive medication including pill, patch, ring, injection,contraceptive implant; hormonal and nonhormonal IUDs; permanent sterilization options including vasectomy and the various tubal sterilization modalities. Risks and benefits reviewed.  Questions were answered.  Information was given to patient to review.   Now s/p uncomplicated nexplanon  removal   Routine postpartum exam.   Plan:   Essential components of care per ACOG recommendations:  1.  Mood and well being: Patient with negative depression screening today. Reviewed  local resources for support.  - Patient tobacco use? No.   - hx of drug use? No.    2. Infant care and feeding:  -Patient currently breastmilk feeding? Yes. Reviewed importance of draining breast regularly to support lactation.  -Social determinants of health (SDOH) reviewed in EPIC. No concerns  3. Sexuality, contraception and birth spacing - Patient does not want a pregnancy in the next year.  Desired family size is 5 children.  - Reviewed reproductive life planning. Reviewed contraceptive methods based on pt preferences and effectiveness.  Patient desired Abstinence today.   - Discussed birth spacing of 18 months  4. Sleep and fatigue -Encouraged family/partner/community support of 4 hrs of uninterrupted sleep to help with mood and fatigue  5. Physical Recovery  - Discussed patients delivery and complications. She describes her labor as good. - Patient had a Vaginal, no problems at delivery. Patient had no laceration. Perineal healing reviewed. Patient expressed understanding - Patient has urinary incontinence? No. - Patient is safe to resume physical and sexual activity  6.  Health Maintenance - HM due items addressed Yes - Last pap smear  Diagnosis  Date Value Ref Range Status  06/04/2023   Final   - Negative for Intraepithelial Lesions or Malignancy (NILM)  06/04/2023 - Benign reactive/reparative changes  Final   Pap smear not done at today's visit.  -Breast Cancer screening indicated? No.   7. Chronic Disease/Pregnancy Condition follow up: Gestational Diabetes  - PCP follow up  Carter Quarry, MD, FACOG Minimally Invasive Gynecologic Surgery  Obstetrics and Gynecology, [facility] for [facility], [facility] 07/14/2024    Center for [employer], [facility]

## 2024-07-14 NOTE — Progress Notes (Signed)
 NEXPLANON  REMOVAL The risks (including infection, bleeding, pain, and uterine perforation) and benefits of the procedure were explained to the patient and Written informed consent was obtained.   Device was palpated in left upper arm. After time out, the skin was cleaned with alcohol and infiltrated with 1cc of 1% lidocaine  with epinephrine was used to infiltrate the skin and subcutaneous tissue deep to the device. The area was cleaned with betadine  x3.  Using an 11 blade, the skin was incised and the implant was removed intact.  The implant was shown to the patient.  The skin was cleaned, incision covered with Steri-Strips, and an adhesive bandage.  Arm was wrapped and post procedure instructions provided to the patient.  Abstinence chose as new contraceptive method.

## 2024-07-15 LAB — GLUCOSE TOLERANCE, 2 HOURS
Glucose, 2 hour: 58 mg/dL — ABNORMAL LOW (ref 70–139)
Glucose, GTT - Fasting: 78 mg/dL (ref 70–99)

## 2024-07-17 ENCOUNTER — Ambulatory Visit: Payer: Self-pay | Admitting: Obstetrics and Gynecology

## 2024-07-19 ENCOUNTER — Ambulatory Visit: Admitting: Obstetrics and Gynecology

## 2024-08-08 ENCOUNTER — Ambulatory Visit (INDEPENDENT_AMBULATORY_CARE_PROVIDER_SITE_OTHER): Admitting: Nurse Practitioner

## 2024-08-10 ENCOUNTER — Encounter (INDEPENDENT_AMBULATORY_CARE_PROVIDER_SITE_OTHER): Payer: Self-pay

## 2024-08-14 ENCOUNTER — Encounter (INDEPENDENT_AMBULATORY_CARE_PROVIDER_SITE_OTHER): Payer: Self-pay | Admitting: Nurse Practitioner

## 2024-08-18 ENCOUNTER — Encounter: Admitting: Family

## 2024-08-18 NOTE — Progress Notes (Signed)
 Erroneous encounter-disregard

## 2024-08-22 ENCOUNTER — Ambulatory Visit (INDEPENDENT_AMBULATORY_CARE_PROVIDER_SITE_OTHER): Admitting: Nurse Practitioner

## 2024-12-27 ENCOUNTER — Ambulatory Visit (INDEPENDENT_AMBULATORY_CARE_PROVIDER_SITE_OTHER)

## 2024-12-27 ENCOUNTER — Other Ambulatory Visit (HOSPITAL_COMMUNITY)
Admission: RE | Admit: 2024-12-27 | Discharge: 2024-12-27 | Disposition: A | Discharge status: Home / Self Care | Source: Ambulatory Visit | Attending: Family | Admitting: Family

## 2024-12-27 ENCOUNTER — Ambulatory Visit: Admitting: Family

## 2024-12-27 ENCOUNTER — Ambulatory Visit: Payer: Self-pay | Admitting: Family

## 2024-12-27 VITALS — BP 111/71 | HR 81 | Ht 63.0 in | Wt 205.0 lb

## 2024-12-27 DIAGNOSIS — R635 Abnormal weight gain: Secondary | ICD-10-CM

## 2024-12-27 DIAGNOSIS — M549 Dorsalgia, unspecified: Secondary | ICD-10-CM

## 2024-12-27 DIAGNOSIS — B9689 Other specified bacterial agents as the cause of diseases classified elsewhere: Secondary | ICD-10-CM

## 2024-12-27 DIAGNOSIS — A599 Trichomoniasis, unspecified: Secondary | ICD-10-CM

## 2024-12-27 DIAGNOSIS — K5900 Constipation, unspecified: Secondary | ICD-10-CM | POA: Diagnosis not present

## 2024-12-27 DIAGNOSIS — B3731 Acute candidiasis of vulva and vagina: Secondary | ICD-10-CM

## 2024-12-27 LAB — POCT URINE PREGNANCY: Preg Test, Ur: NEGATIVE

## 2024-12-27 LAB — POCT URINALYSIS DIP (CLINITEK)
Bilirubin, UA: NEGATIVE
Blood, UA: NEGATIVE
Glucose, UA: NEGATIVE mg/dL
Ketones, POC UA: NEGATIVE mg/dL
Nitrite, UA: NEGATIVE
Spec Grav, UA: 1.025
Urobilinogen, UA: 1 U/dL
pH, UA: 7

## 2024-12-27 MED ORDER — KETOROLAC TROMETHAMINE 30 MG/ML IJ SOLN
30.0000 mg | Freq: Once | INTRAMUSCULAR | Status: AC
  Administered 2024-12-27: 30 mg via INTRAMUSCULAR

## 2024-12-27 MED ORDER — TRIAMCINOLONE ACETONIDE 40 MG/ML IJ SUSP
40.0000 mg | Freq: Once | INTRAMUSCULAR | Status: AC
  Administered 2024-12-27: 40 mg via INTRAMUSCULAR

## 2024-12-27 MED ORDER — LINACLOTIDE 72 MCG PO CAPS: 72.0000 ug | ORAL_CAPSULE | Freq: Every day | ORAL | 0 refills | Status: AC

## 2024-12-27 MED ORDER — SEMAGLUTIDE-WEIGHT MANAGEMENT 0.25 MG/0.5ML ~~LOC~~ SOAJ: 0.2500 mg | SUBCUTANEOUS | 0 refills | Status: AC

## 2024-12-27 NOTE — Progress Notes (Signed)
 "   Patient ID: Jillian Bishop, female    DOB: 04-05-1996  MRN: [id number]  CC: Follow-Up  Subjective: Jillian Bishop is a 29 y.o. female who presents for follow-up.  Her concerns today include:  - Lower back pain for 1.5 weeks. Denies recent trauma/injury and red flag symptoms. Taking over-the-counter medication with minimal relief.  - Constipation. Denies red flag symptoms. Taking over-the-counter medication with minimal relief. 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 States thinks she may have irritable bowel syndrome. - Would like to try a weight loss injection medication. She is watching what she eats. She exercises.   Patient Active Problem List   Diagnosis Date Noted   Abnormal fetal ultrasound 06/03/2024   GDM (gestational diabetes mellitus) 04/07/2024   Anemia 04/07/2024   Alpha thalassemia silent carrier 12/16/2023   BMI 34.0-34.9,adult 06/02/2023   Vitamin D  deficiency 06/19/2022   Asthma 06/18/2022   Chronic migraine w/o aura w/o status migrainosus, not intractable 12/23/2021   Family history of brain aneurysm 12/23/2021     Medications Ordered Prior to Encounter[1]  Allergies[2]  Social History   Socioeconomic History   Marital status: Single    Spouse name: Not on file   Number of children: Not on file   Years of education: Not on file   Highest education level: 12th grade  Occupational History   Not on file  Tobacco Use   Smoking status: Never    Passive exposure: Never   Smokeless tobacco: Never  Vaping Use   Vaping status: Never Used  Substance and Sexual Activity   Alcohol use: Not Currently   Drug use: No   Sexual activity: Not Currently    Birth control/protection: Implant, None  Other Topics Concern   Not on file  Social History Narrative   Not on file   Social Drivers of Health   Tobacco Use: Unknown (07/25/2024)   Received from [facility]   Patient History    Smoking Tobacco Use: Never     Smokeless Tobacco Use: Unknown    Passive Exposure: Not on file  Financial Resource Strain: Low Risk (12/27/2024)   Overall Financial Resource Strain (CARDIA)    Difficulty of Paying Living Expenses: Not hard at all  Food Insecurity: No Food Insecurity (12/27/2024)   Epic    Worried About Programme Researcher, Broadcasting/film/video in the Last Year: Never true    Ran Out of Food in the Last Year: Never true  Transportation Needs: No Transportation Needs (12/27/2024)   Epic    Lack of Transportation (Medical): No    Lack of Transportation (Non-Medical): No  Physical Activity: Sufficiently Active (12/27/2024)   Exercise Vital Sign    Days of Exercise per Week: 2 days    Minutes of Exercise per Session: 150+ min  Stress: Stress Concern Present (12/27/2024)   [name] of Occupational Health - Occupational Stress Questionnaire    Feeling of Stress: Very much  Social Connections: Moderately Isolated (12/27/2024)   Social Connection and Isolation Panel    Frequency of Communication with Friends and Family: More than three times a week    Frequency of Social Gatherings with Friends and Family: Three times a week    Attends Religious Services: More than 4 times per year    Active Member of Clubs or Organizations: No    Attends Banker Meetings: Not on file    Marital Status: Never married  Intimate Partner Violence:  Not At Risk (07/24/2024)   Received from [facility]   HITS    Over the last 12 months how often did your partner physically hurt you?: Never    Over the last 12 months how often did your partner insult you or talk down to you?: Never    Over the last 12 months how often did your partner threaten you with physical harm?: Never    Over the last 12 months how often did your partner scream or curse at you?: Never  Depression (PHQ2-9): Low Risk (05/30/2024)   Depression (PHQ2-9)    PHQ-2 Score: 0  Alcohol Screen: Low Risk (12/27/2024)   Alcohol Screen    Last Alcohol Screening Score  (AUDIT): 1  Housing: Low Risk (12/27/2024)   Epic    Unable to Pay for Housing in the Last Year: No    Number of Times Moved in the Last Year: 1    Homeless in the Last Year: No  Utilities: Not At Risk (07/24/2024)   [facility]    In the past 12 months has the electric, gas, oil, or water company threatened to shut off services in your home?: No  Health Literacy: Not on file    Family History  Problem Relation Age of Onset   Hypertension Mother    Diabetes Mother    Hypertension Father    Diabetes Father    Hypertension Sister    Hypertension Sister    Diabetes Sister    Diabetes Maternal Aunt    Diabetes Maternal Grandmother    Hypertension Maternal Grandmother    Heart disease Paternal Grandmother    Hypertension Paternal Grandmother     Past Surgical History:  Procedure Laterality Date   APPENDECTOMY  age 59 or 3   DILATION AND EVACUATION N/A 11/19/2018   Procedure: DILATATION AND EVACUATION;  Surgeon: Estelle Service, MD;  Location: [facility];  Service: Gynecology;  Laterality: N/A;   LAPAROSCOPIC OVARIAN CYSTECTOMY Right 09/05/2020   Procedure: LAPAROSCOPIC OVARIAN CYSTECTOMY LYSIS OF ADHESISONS;  Surgeon: Delana Ted Morrison, DO;  Location: [facility];  Service: Gynecology;  Laterality: Right;   PR INSERTION DRUG DELIVERY IMPLANT  06/05/2024    ROS: Review of Systems Negative except as stated above  PHYSICAL EXAM: BP 111/71 (BP Location: Left Arm, Patient Position: Sitting, Cuff Size: Normal)   Pulse 81   Ht 5' 3 (1.6 m)   Wt 205 lb (93 kg)   SpO2 98%   BMI 36.31 kg/m   Physical Exam HENT:     Head: Normocephalic and atraumatic.     Nose: Nose normal.     Mouth/Throat:     Mouth: Mucous membranes are moist.     Pharynx: Oropharynx is clear.  Eyes:     Extraocular Movements: Extraocular movements intact.     Conjunctiva/sclera: Conjunctivae normal.     Pupils: Pupils are equal, round, and reactive to light.   Cardiovascular:     Rate and Rhythm: Normal rate and regular rhythm.     Pulses: Normal pulses.     Heart sounds: Normal heart sounds.  Pulmonary:     Effort: Pulmonary effort is normal.     Breath sounds: Normal breath sounds.  Abdominal:     General: Bowel sounds are normal.     Palpations: Abdomen is soft.  Musculoskeletal:        General: Normal range of motion.     Cervical back: Normal, normal range of  motion and neck supple.     Thoracic back: Normal.     Lumbar back: Normal.  Neurological:     General: No focal deficit present.     Mental Status: She is alert and oriented to person, place, and time.  Psychiatric:        Mood and Affect: Mood normal.        Behavior: Behavior normal.      ASSESSMENT AND PLAN: 1. Back pain, unspecified back location, unspecified back pain laterality, unspecified chronicity (Primary) - Triamcinolone  Acetonide and Ketorolac  administered in office.  - DG Lumbar Spine for evaluation.  - Routine screening.  - Follow-up with primary provider as scheduled.  - DG Lumbar Spine Complete; Future - triamcinolone  acetonide (KENALOG -40) injection 40 mg - ketorolac  (TORADOL ) 30 MG/ML injection 30 mg - Basic Metabolic Panel - POCT URINALYSIS DIP (CLINITEK); Future - Urine Culture - Cervicovaginal ancillary only - POCT urine pregnancy; Future  2. Constipation, unspecified constipation type - Linaclotide  as prescribed. Counseled on medication adherence/adverse effects.  - Patient declined referral to specialist.  - Follow-up with primary provider in 4 weeks or sooner if needed.  - linaclotide  (LINZESS ) 72 MCG capsule; Take 1 capsule (72 mcg total) by mouth daily before breakfast.  Dispense: 90 capsule; Refill: 0  3. Weight gain - Semaglutide -Weight Management a prescribed. Counseled on medication adherence/adverse effects.  - Routine screening.  - Patient declined referral to Medical Weight Management.  - Follow-up with primary provider in 4  weeks or sooner if needed.  - semaglutide -weight management (WEGOVY ) 0.25 MG/0.5ML SOAJ SQ injection; Inject 0.25 mg into the skin once a week.  Dispense: 2 mL; Refill: 0 - TSH    Patient was given the opportunity to ask questions.  Patient verbalized understanding of the plan and was able to repeat key elements of the plan. Patient was given clear instructions to go to Emergency Department or return to medical center if symptoms don't improve, worsen, or new problems develop.The patient verbalized understanding.   Orders Placed This Encounter  Procedures   Urine Culture   DG Lumbar Spine Complete   Basic Metabolic Panel   TSH   POCT URINALYSIS DIP (CLINITEK)   POCT urine pregnancy     Requested Prescriptions   Signed Prescriptions Disp Refills   linaclotide  (LINZESS ) 72 MCG capsule 90 capsule 0    Sig: Take 1 capsule (72 mcg total) by mouth daily before breakfast.   semaglutide -weight management (WEGOVY ) 0.25 MG/0.5ML SOAJ SQ injection 2 mL 0    Sig: Inject 0.25 mg into the skin once a week.    Return in about 4 weeks (around 01/24/2025) for Follow-Up or next available weight check .  Greig JINNY Chute, NP      [1]  Current Outpatient Medications on File Prior to Visit  Medication Sig Dispense Refill   Accu-Chek Softclix Lancets lancets Please check your blood sugar 4 times daily. Once in the morning when waking up (fasting), and 2 hours after breakfast, lunch, and dinner. 100 each 12   albuterol  (VENTOLIN  HFA) 108 (90 Base) MCG/ACT inhaler Inhale 2 puffs into the lungs every 6 (six) hours as needed for wheezing or shortness of breath. 8 g 2   Blood Glucose Monitoring Suppl (ACCU-CHEK GUIDE) w/Device KIT 1 Device by Does not apply route 4 (four) times daily. Please check your blood sugar 4 times daily. Once in the morning when waking up (fasting), and 2 hours after breakfast, lunch, and dinner. 1 kit 0  famotidine  (PEPCID ) 20 MG tablet Take 1 tablet (20 mg total) by mouth 2 (two)  times daily. 60 tablet 3   ferrous sulfate  325 (65 FE) MG tablet Take 1 tablet (325 mg total) by mouth every other day. 90 tablet 1   glucose blood (ACCU-CHEK GUIDE TEST) test strip Please check your blood sugar 4 times daily. Once in the morning when waking up (fasting), and 2 hours after breakfast, lunch, and dinner. 100 each 12   Prenatal Vit-Fe Fumarate-FA (PREPLUS) 27-1 MG TABS Take 1 tablet by mouth daily. 30 tablet 13   acetaminophen  (TYLENOL ) 325 MG tablet Take 2 tablets (650 mg total) by mouth every 4 (four) hours as needed (for pain scale < 4). (Patient not taking: Reported on 07/14/2024) 30 tablet 0   budesonide -formoterol  (SYMBICORT ) 160-4.5 MCG/ACT inhaler Inhale 2 puffs into the lungs in the morning and at bedtime. (Patient not taking: Reported on 12/27/2024) 1 each 12   ibuprofen  (ADVIL ) 600 MG tablet Take 1 tablet (600 mg total) by mouth every 6 (six) hours. (Patient not taking: Reported on 07/14/2024) 30 tablet 0   metroNIDAZOLE  (METROGEL ) 0.75 % vaginal gel Place 1 Applicatorful vaginally at bedtime. Apply one applicatorful to vagina at bedtime for 5 days (Patient not taking: Reported on 05/30/2024) 70 g 1   senna-docusate (SENOKOT-S) 8.6-50 MG tablet Take 2 tablets by mouth daily. (Patient not taking: Reported on 07/14/2024) 30 tablet 0   SUMAtriptan  (IMITREX ) 100 MG tablet Take 1 tablet (100 mg total) by mouth once as needed for up to 1 dose for migraine. May repeat in 2 hours if headache persists or recurs. (Patient not taking: Reported on 05/30/2024) 9 tablet 11   No current facility-administered medications on file prior to visit.  [2] No Known Allergies  "

## 2024-12-28 ENCOUNTER — Encounter: Payer: Self-pay | Admitting: Family

## 2024-12-28 LAB — BASIC METABOLIC PANEL WITH GFR
BUN/Creatinine Ratio: 14 (ref 9–23)
BUN: 9 mg/dL (ref 6–20)
CO2: 20 mmol/L (ref 20–29)
Calcium: 9.2 mg/dL (ref 8.7–10.2)
Chloride: 102 mmol/L (ref 96–106)
Creatinine, Ser: 0.64 mg/dL (ref 0.57–1.00)
Glucose: 80 mg/dL (ref 70–99)
Potassium: 4.1 mmol/L (ref 3.5–5.2)
Sodium: 139 mmol/L (ref 134–144)
eGFR: 122 mL/min/1.73

## 2024-12-28 LAB — TSH: TSH: 1.71 u[IU]/mL (ref 0.450–4.500)

## 2024-12-28 LAB — CERVICOVAGINAL ANCILLARY ONLY
Bacterial Vaginitis (gardnerella): POSITIVE — AB
Candida Glabrata: NEGATIVE
Candida Vaginitis: POSITIVE — AB
Chlamydia: NEGATIVE
Comment: NEGATIVE
Comment: NEGATIVE
Comment: NEGATIVE
Comment: NEGATIVE
Comment: NEGATIVE
Comment: NORMAL
Neisseria Gonorrhea: NEGATIVE
Trichomonas: POSITIVE — AB

## 2024-12-29 ENCOUNTER — Other Ambulatory Visit: Payer: Self-pay | Admitting: Family

## 2024-12-29 DIAGNOSIS — M549 Dorsalgia, unspecified: Secondary | ICD-10-CM

## 2024-12-29 LAB — URINE CULTURE

## 2024-12-29 MED ORDER — FLUCONAZOLE 150 MG PO TABS: 150.0000 mg | ORAL_TABLET | Freq: Once | ORAL | 0 refills | Status: AC

## 2024-12-29 MED ORDER — MELOXICAM 7.5 MG PO TABS: 7.5000 mg | ORAL_TABLET | Freq: Every day | ORAL | 0 refills | Status: AC

## 2024-12-29 MED ORDER — METRONIDAZOLE 500 MG PO TABS: 500.0000 mg | ORAL_TABLET | Freq: Two times a day (BID) | ORAL | 0 refills | Status: AC

## 2024-12-29 NOTE — Telephone Encounter (Signed)
-   Meloxicam  prescribed. - Refer to result note from 12/29/2024  7:56 AM EST.

## 2025-01-15 ENCOUNTER — Ambulatory Visit: Admitting: Family

## 2025-01-24 ENCOUNTER — Ambulatory Visit: Payer: Self-pay | Admitting: Family
